# Patient Record
Sex: Male | Born: 1975
Health system: Southern US, Community
[De-identification: ages and names within clinical notes are randomized; demographics above are authoritative.]

## PROBLEM LIST (undated history)

## (undated) DIAGNOSIS — Z9081 Acquired absence of spleen: Secondary | ICD-10-CM

## (undated) DIAGNOSIS — Z9049 Acquired absence of other specified parts of digestive tract: Secondary | ICD-10-CM

## (undated) DIAGNOSIS — G35 Multiple sclerosis: Secondary | ICD-10-CM

## (undated) DIAGNOSIS — Z993 Dependence on wheelchair: Secondary | ICD-10-CM

## (undated) DIAGNOSIS — G35D Multiple sclerosis, unspecified: Secondary | ICD-10-CM

## (undated) HISTORY — DX: Dependence on wheelchair: Z99.3

## (undated) HISTORY — DX: Acquired absence of spleen: Z90.81

## (undated) HISTORY — DX: Acquired absence of other specified parts of digestive tract: Z90.49

---

## 1999-03-15 ENCOUNTER — Emergency Department (HOSPITAL_COMMUNITY): Admission: EM | Admit: 1999-03-15 | Discharge: 1999-03-15 | Payer: Self-pay | Admitting: Emergency Medicine

## 1999-07-06 ENCOUNTER — Emergency Department (HOSPITAL_COMMUNITY): Admission: EM | Admit: 1999-07-06 | Discharge: 1999-07-06 | Payer: Self-pay | Admitting: Emergency Medicine

## 2003-10-18 ENCOUNTER — Emergency Department (HOSPITAL_COMMUNITY): Admission: EM | Admit: 2003-10-18 | Discharge: 2003-10-18 | Payer: Self-pay

## 2006-11-01 ENCOUNTER — Emergency Department (HOSPITAL_COMMUNITY): Admission: EM | Admit: 2006-11-01 | Discharge: 2006-11-01 | Payer: Self-pay | Admitting: Emergency Medicine

## 2006-11-19 ENCOUNTER — Emergency Department (HOSPITAL_COMMUNITY): Admission: EM | Admit: 2006-11-19 | Discharge: 2006-11-19 | Payer: Self-pay | Admitting: Emergency Medicine

## 2006-11-25 ENCOUNTER — Ambulatory Visit (HOSPITAL_COMMUNITY): Admission: RE | Admit: 2006-11-25 | Discharge: 2006-11-25 | Payer: Self-pay | Admitting: *Deleted

## 2006-12-08 ENCOUNTER — Ambulatory Visit: Payer: Self-pay | Admitting: Family Medicine

## 2006-12-14 ENCOUNTER — Ambulatory Visit (HOSPITAL_COMMUNITY): Admission: RE | Admit: 2006-12-14 | Discharge: 2006-12-14 | Payer: Self-pay | Admitting: *Deleted

## 2007-02-02 ENCOUNTER — Ambulatory Visit: Payer: Self-pay | Admitting: Family Medicine

## 2007-06-13 ENCOUNTER — Encounter: Admission: RE | Admit: 2007-06-13 | Discharge: 2007-09-11 | Payer: Self-pay | Admitting: *Deleted

## 2007-10-26 DIAGNOSIS — G35 Multiple sclerosis: Secondary | ICD-10-CM | POA: Insufficient documentation

## 2007-10-26 DIAGNOSIS — G35D Multiple sclerosis, unspecified: Secondary | ICD-10-CM | POA: Insufficient documentation

## 2008-06-06 ENCOUNTER — Emergency Department (HOSPITAL_COMMUNITY): Admission: EM | Admit: 2008-06-06 | Discharge: 2008-06-06 | Payer: Self-pay | Admitting: Emergency Medicine

## 2008-08-24 ENCOUNTER — Emergency Department (HOSPITAL_COMMUNITY): Admission: EM | Admit: 2008-08-24 | Discharge: 2008-08-24 | Payer: Self-pay | Admitting: Emergency Medicine

## 2009-01-18 ENCOUNTER — Encounter: Admission: RE | Admit: 2009-01-18 | Discharge: 2009-04-03 | Payer: Self-pay | Admitting: *Deleted

## 2011-09-24 LAB — CBC
HCT: 44.7
Hemoglobin: 14.5
MCHC: 32.5
MCV: 71.6 — ABNORMAL LOW
Platelets: 124 — ABNORMAL LOW
RBC: 6.25 — ABNORMAL HIGH
RDW: 13.9
WBC: 4.1

## 2011-09-24 LAB — COMPREHENSIVE METABOLIC PANEL
ALT: 13
AST: 23
Albumin: 4.3
Alkaline Phosphatase: 65
BUN: 4 — ABNORMAL LOW
CO2: 29
Calcium: 9.4
Chloride: 101
Creatinine, Ser: 1.26
GFR calc Af Amer: >60
GFR calc non Af Amer: >60
Glucose, Bld: 92
Potassium: 4.3
Sodium: 139
Total Bilirubin: 0.7
Total Protein: 6.9

## 2011-09-24 LAB — DIFFERENTIAL
Basophils Absolute: 0
Basophils Relative: 0
Eosinophils Absolute: 0
Eosinophils Relative: 1
Lymphocytes Relative: 38
Lymphs Abs: 1.6
Monocytes Absolute: 0.6
Monocytes Relative: 14 — ABNORMAL HIGH
Neutro Abs: 1.9
Neutrophils Relative %: 47

## 2011-09-24 LAB — URINALYSIS, ROUTINE W REFLEX MICROSCOPIC
Bilirubin Urine: NEGATIVE
Glucose, UA: NEGATIVE
Hgb urine dipstick: NEGATIVE
Ketones, ur: NEGATIVE
Leukocytes, UA: NEGATIVE
Nitrite: NEGATIVE
Protein, ur: 100 — AB
Specific Gravity, Urine: 1.023
Urobilinogen, UA: 0.2
pH: 6.5

## 2011-09-24 LAB — URINE MICROSCOPIC-ADD ON

## 2012-01-31 ENCOUNTER — Encounter (HOSPITAL_COMMUNITY): Payer: Self-pay | Admitting: *Deleted

## 2012-01-31 ENCOUNTER — Emergency Department (HOSPITAL_COMMUNITY)
Admission: EM | Admit: 2012-01-31 | Discharge: 2012-01-31 | Disposition: A | Discharge status: Home / Self Care | Payer: Medicare Other | Attending: Emergency Medicine | Admitting: Emergency Medicine

## 2012-01-31 DIAGNOSIS — S2092XA Blister (nonthermal) of unspecified parts of thorax, initial encounter: Secondary | ICD-10-CM | POA: Insufficient documentation

## 2012-01-31 DIAGNOSIS — F17200 Nicotine dependence, unspecified, uncomplicated: Secondary | ICD-10-CM | POA: Insufficient documentation

## 2012-01-31 DIAGNOSIS — X58XXXA Exposure to other specified factors, initial encounter: Secondary | ICD-10-CM | POA: Insufficient documentation

## 2012-01-31 DIAGNOSIS — S30821A Blister (nonthermal) of abdominal wall, initial encounter: Secondary | ICD-10-CM

## 2012-01-31 HISTORY — DX: Multiple sclerosis: G35

## 2012-01-31 HISTORY — DX: Multiple sclerosis, unspecified: G35.D

## 2012-01-31 NOTE — ED Provider Notes (Signed)
History     CSN: [id number]  Arrival date & time 01/31/12  1021   First MD Initiated Contact with Patient 01/31/12 1029      Chief Complaint  Patient presents with  . Blister    left groin     The history is provided by the patient.  Pt is a 36 y/o M who presents c/o a "lesion" to his left upper thigh. He reports unprotected intercourse with a male 1 year ago and subsequent development of a "flaky" lesion to the same area after the relations. The lesion resolved and there was non recurrence until 1 week ago when he noticed a non-painful blister to the same location. He denies fever, chills, CP, abd pain, dysuria, penile pain or discharge, testicular pain or discharge. Denies rash to any other part of the body. There has been no prior treatment.  Past Medical History  Diagnosis Date  . Multiple sclerosis     History reviewed. No pertinent past surgical history.  No family history on file.  History  Substance Use Topics  . Smoking status: Current Everyday Smoker -- 0.5 packs/day    Types: Cigarettes  . Smokeless tobacco: Not on file  . Alcohol Use: No      Review of Systems 10 systems reviewed and are negative for acute change except as noted in the HPI.  Allergies  Review of patient's allergies indicates no known allergies.  Home Medications  No current outpatient prescriptions on file.  BP 132/83  Pulse 96  Temp(Src) 98.1 F (36.7 C) (Oral)  SpO2 98%  Physical Exam  Nursing note and vitals reviewed. Constitutional: He is oriented to person, place, and time. He appears well-developed and well-nourished. No distress.  HENT:  Head: Normocephalic and atraumatic.  Right Ear: External ear normal.  Left Ear: External ear normal.  Eyes: Pupils are equal, round, and reactive to light.  Neck: Neck supple.  Cardiovascular: Normal rate and regular rhythm.   Pulmonary/Chest: Effort normal. No respiratory distress.  Abdominal: Soft. He exhibits no distension. There  is no tenderness.  Genitourinary: Testes normal and penis normal.    Right testis shows no mass, no swelling and no tenderness. Left testis shows no mass, no swelling and no tenderness. Circumcised. No penile erythema or penile tenderness. No discharge found.  Musculoskeletal: He exhibits no edema and no tenderness.  Lymphadenopathy:       Right: No inguinal adenopathy present.       Left: No inguinal adenopathy present.  Neurological: He is alert and oriented to person, place, and time.  Skin: Skin is warm and dry.  Psychiatric: He has a normal mood and affect.    ED Course  Procedures (including critical care time)  Labs Reviewed - No data to display No results found.   1. Blister of groin without infection       MDM  Blister to proximal left thigh/groin. No fever. No mucosal lesions or lesions to palms or soles. No recent weight loss. Do not suspect systemic cause at this time. No penile d/c- no STD testing performed in ED. Advised f/u at STD clinic for HIV/syphillis testing.        Shaaron Adler, [place] 01/31/12 [phone]

## 2012-01-31 NOTE — ED Notes (Signed)
Patient noted  White flaky lesion to left groin approximately one year ago after "sleeping with someone". It resolved and now had blister type lesion that appeared approximately one week ago.

## 2012-02-01 NOTE — ED Provider Notes (Signed)
Medical screening examination/treatment/procedure(s) were performed by non-physician practitioner and as supervising physician I was immediately available for consultation/collaboration.  Cacey Willow, MD 02/01/12 1926 

## 2013-06-21 DIAGNOSIS — G250 Essential tremor: Secondary | ICD-10-CM | POA: Insufficient documentation

## 2013-06-21 DIAGNOSIS — G252 Other specified forms of tremor: Secondary | ICD-10-CM | POA: Insufficient documentation

## 2013-06-21 DIAGNOSIS — R270 Ataxia, unspecified: Secondary | ICD-10-CM | POA: Insufficient documentation

## 2015-10-17 ENCOUNTER — Emergency Department (HOSPITAL_COMMUNITY)
Admission: EM | Admit: 2015-10-17 | Discharge: 2015-10-17 | Disposition: A | Discharge status: Home / Self Care | Payer: Medicare Other | Attending: Emergency Medicine | Admitting: Emergency Medicine

## 2015-10-17 ENCOUNTER — Encounter (HOSPITAL_COMMUNITY): Payer: Self-pay | Admitting: Emergency Medicine

## 2015-10-17 DIAGNOSIS — Y9389 Activity, other specified: Secondary | ICD-10-CM | POA: Insufficient documentation

## 2015-10-17 DIAGNOSIS — Z79899 Other long term (current) drug therapy: Secondary | ICD-10-CM | POA: Diagnosis not present

## 2015-10-17 DIAGNOSIS — W2209XA Striking against other stationary object, initial encounter: Secondary | ICD-10-CM | POA: Insufficient documentation

## 2015-10-17 DIAGNOSIS — Y92009 Unspecified place in unspecified non-institutional (private) residence as the place of occurrence of the external cause: Secondary | ICD-10-CM | POA: Insufficient documentation

## 2015-10-17 DIAGNOSIS — Z8669 Personal history of other diseases of the nervous system and sense organs: Secondary | ICD-10-CM | POA: Diagnosis not present

## 2015-10-17 DIAGNOSIS — S99921A Unspecified injury of right foot, initial encounter: Secondary | ICD-10-CM | POA: Diagnosis present

## 2015-10-17 DIAGNOSIS — Z720 Tobacco use: Secondary | ICD-10-CM | POA: Insufficient documentation

## 2015-10-17 DIAGNOSIS — Y998 Other external cause status: Secondary | ICD-10-CM | POA: Insufficient documentation

## 2015-10-17 DIAGNOSIS — S91209A Unspecified open wound of unspecified toe(s) with damage to nail, initial encounter: Secondary | ICD-10-CM

## 2015-10-17 DIAGNOSIS — S91204A Unspecified open wound of right lesser toe(s) with damage to nail, initial encounter: Secondary | ICD-10-CM | POA: Insufficient documentation

## 2015-10-17 NOTE — ED Notes (Signed)
Bed: QM08 Expected date:  Expected time:  Means of arrival:  Comments: Ems- MS
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
## 2015-10-17 NOTE — Discharge Instructions (Signed)
You have a traumatic avulsion of your Toenail. There is a chance that he toenail may not grow back or that it may grow back deformed. The child does go back and will take quite a while to do so. In the meantime keep the area clean and dry. Use antibacterial soap and warm water at least twice a day to keep it clean. Dry with a clean towel and then treat as you would any other wound. He'll likely develop a scab over the area. Keep evaluating for any evidence of bacterial infection such as worsening redness, drainage, spreading redness from the site or swelling.

## 2015-10-17 NOTE — ED Provider Notes (Signed)
CSN: [id number]     Arrival date & time 10/17/15  1047 History   First MD Initiated Contact with Patient 10/17/15 1103     Chief Complaint  Patient presents with  . Foot Injury     (Consider location/radiation/quality/duration/timing/severity/associated sxs/prior Treatment) Patient is a 39 y.o. male presenting with foot injury.  Foot Injury Location:  Toe Time since incident:  1 day Injury: yes   Pain details:    Severity:  Mild   Onset quality:  Sudden   Duration:  1 day   Timing:  Constant Relieved by:  None tried Worsened by:  Nothing tried Ineffective treatments:  None tried Associated symptoms: no back pain, no fatigue and no fever     Past Medical History  Diagnosis Date  . Multiple sclerosis (HCC)    History reviewed. No pertinent past surgical history. History reviewed. No pertinent family history. Social History  Substance Use Topics  . Smoking status: Current Every Day Smoker -- 0.50 packs/day    Types: Cigarettes  . Smokeless tobacco: None  . Alcohol Use: No    Review of Systems  Constitutional: Negative for fever, chills and fatigue.  Eyes: Negative for photophobia and pain.  Respiratory: Negative for cough and shortness of breath.   Genitourinary: Negative for dysuria and flank pain.  Musculoskeletal: Negative for back pain.  Skin: Positive for wound.  All other systems reviewed and are negative.     Allergies  Review of patient's allergies indicates no known allergies.  Home Medications   Prior to Admission medications   Medication Sig Start Date End Date Taking? Authorizing Provider  natalizumab (TYSABRI) 300 MG/15ML injection Inject into the vein every 30 (thirty) days.   Yes Historical Provider, MD  oxybutynin (DITROPAN) 5 MG tablet Take 5 mg by mouth 2 (two) times daily.   Yes Historical Provider, MD   BP 115/88 mmHg  Pulse 63  Temp(Src) 98 F (36.7 C) (Oral)  Resp 18  SpO2 100% Physical Exam  Constitutional: He is oriented to  person, place, and time. He appears well-developed and well-nourished.  HENT:  Head: Normocephalic and atraumatic.  Eyes: Conjunctivae and EOM are normal.  Neck: Normal range of motion. Neck supple.  Cardiovascular: Normal rate and regular rhythm.   Pulmonary/Chest: Effort normal. No respiratory distress.  Abdominal: Soft. There is no tenderness.  Musculoskeletal: Normal range of motion. He exhibits no edema or tenderness.  Neurological: He is alert and oriented to person, place, and time.  Skin: Skin is warm and dry.  Avulsed right third toe nail and some skin with it. No laceration. Hemostatic. No deformity or swelling otherwise.   Nursing note and vitals reviewed.   ED Course  Procedures (including critical care time) Labs Review Labs Reviewed - No data to display  Imaging Review No results found. I have personally reviewed and evaluated these images and lab results as part of my medical decision-making.   EKG Interpretation None      MDM   Final diagnoses:  Avulsed toenail, initial encounter   Patient with avulsed toenail. Doubt underlying fracture. No nailbed injury. Hemostatic at this time. Wound cleaned, xeroform applied. Will keep clean/dry, abx ointment applied at home. .  I have personally and contemperaneously reviewed labs and imaging and used in my decision making as above.   A medical screening exam was performed and I feel the patient has had an appropriate workup for their chief complaint at this time and likelihood of emergent condition existing is low.  They have been counseled on decision, discharge, follow up and which symptoms necessitate immediate return to the emergency department. They or their family verbally stated understanding and agreement with plan and discharged in stable condition.      Marily Memos, MD 10/18/15 [phone]

## 2016-04-29 DIAGNOSIS — Z79899 Other long term (current) drug therapy: Secondary | ICD-10-CM | POA: Diagnosis not present

## 2016-04-29 DIAGNOSIS — G35 Multiple sclerosis: Secondary | ICD-10-CM | POA: Diagnosis not present

## 2016-05-13 DIAGNOSIS — G35 Multiple sclerosis: Secondary | ICD-10-CM | POA: Diagnosis not present

## 2016-05-27 DIAGNOSIS — Z79899 Other long term (current) drug therapy: Secondary | ICD-10-CM | POA: Diagnosis not present

## 2016-05-27 DIAGNOSIS — G35 Multiple sclerosis: Secondary | ICD-10-CM | POA: Diagnosis not present

## 2016-06-12 DIAGNOSIS — G35 Multiple sclerosis: Secondary | ICD-10-CM | POA: Diagnosis not present

## 2016-07-06 DIAGNOSIS — G35 Multiple sclerosis: Secondary | ICD-10-CM | POA: Diagnosis not present

## 2016-07-06 DIAGNOSIS — Z79899 Other long term (current) drug therapy: Secondary | ICD-10-CM | POA: Diagnosis not present

## 2016-08-03 DIAGNOSIS — G35 Multiple sclerosis: Secondary | ICD-10-CM | POA: Diagnosis not present

## 2016-08-03 DIAGNOSIS — Z79899 Other long term (current) drug therapy: Secondary | ICD-10-CM | POA: Diagnosis not present

## 2016-08-03 DIAGNOSIS — Z7689 Persons encountering health services in other specified circumstances: Secondary | ICD-10-CM | POA: Diagnosis not present

## 2016-08-11 DIAGNOSIS — G35 Multiple sclerosis: Secondary | ICD-10-CM | POA: Diagnosis not present

## 2016-09-01 DIAGNOSIS — Z79899 Other long term (current) drug therapy: Secondary | ICD-10-CM | POA: Diagnosis not present

## 2016-09-01 DIAGNOSIS — G35 Multiple sclerosis: Secondary | ICD-10-CM | POA: Diagnosis not present

## 2016-09-25 DIAGNOSIS — G35 Multiple sclerosis: Secondary | ICD-10-CM | POA: Diagnosis not present

## 2016-10-02 DIAGNOSIS — G35 Multiple sclerosis: Secondary | ICD-10-CM | POA: Diagnosis not present

## 2016-10-02 DIAGNOSIS — Z79899 Other long term (current) drug therapy: Secondary | ICD-10-CM | POA: Diagnosis not present

## 2016-10-07 DIAGNOSIS — G35 Multiple sclerosis: Secondary | ICD-10-CM | POA: Diagnosis not present

## 2016-10-28 DIAGNOSIS — G35 Multiple sclerosis: Secondary | ICD-10-CM | POA: Diagnosis not present

## 2016-11-02 DIAGNOSIS — Z79899 Other long term (current) drug therapy: Secondary | ICD-10-CM | POA: Diagnosis not present

## 2016-11-02 DIAGNOSIS — G35 Multiple sclerosis: Secondary | ICD-10-CM | POA: Diagnosis not present

## 2016-12-07 DIAGNOSIS — G35 Multiple sclerosis: Secondary | ICD-10-CM | POA: Diagnosis not present

## 2016-12-08 DIAGNOSIS — G35 Multiple sclerosis: Secondary | ICD-10-CM | POA: Diagnosis not present

## 2016-12-08 DIAGNOSIS — Z79899 Other long term (current) drug therapy: Secondary | ICD-10-CM | POA: Diagnosis not present

## 2016-12-09 DIAGNOSIS — G35 Multiple sclerosis: Secondary | ICD-10-CM | POA: Diagnosis not present

## 2017-01-12 DIAGNOSIS — Z79899 Other long term (current) drug therapy: Secondary | ICD-10-CM | POA: Diagnosis not present

## 2017-01-12 DIAGNOSIS — G35 Multiple sclerosis: Secondary | ICD-10-CM | POA: Diagnosis not present

## 2017-01-15 DIAGNOSIS — G35 Multiple sclerosis: Secondary | ICD-10-CM | POA: Diagnosis not present

## 2017-01-21 DIAGNOSIS — G35 Multiple sclerosis: Secondary | ICD-10-CM | POA: Diagnosis not present

## 2017-02-17 DIAGNOSIS — R270 Ataxia, unspecified: Secondary | ICD-10-CM | POA: Diagnosis not present

## 2017-02-17 DIAGNOSIS — Z79899 Other long term (current) drug therapy: Secondary | ICD-10-CM | POA: Diagnosis not present

## 2017-02-17 DIAGNOSIS — R251 Tremor, unspecified: Secondary | ICD-10-CM | POA: Diagnosis not present

## 2017-02-17 DIAGNOSIS — G35 Multiple sclerosis: Secondary | ICD-10-CM | POA: Diagnosis not present

## 2017-03-11 DIAGNOSIS — R29898 Other symptoms and signs involving the musculoskeletal system: Secondary | ICD-10-CM | POA: Diagnosis not present

## 2017-03-11 DIAGNOSIS — Z7409 Other reduced mobility: Secondary | ICD-10-CM | POA: Diagnosis not present

## 2017-03-11 DIAGNOSIS — G35 Multiple sclerosis: Secondary | ICD-10-CM | POA: Diagnosis not present

## 2017-03-15 DIAGNOSIS — G35 Multiple sclerosis: Secondary | ICD-10-CM | POA: Diagnosis not present

## 2017-03-17 DIAGNOSIS — G35 Multiple sclerosis: Secondary | ICD-10-CM | POA: Diagnosis not present

## 2017-04-14 DIAGNOSIS — Z79899 Other long term (current) drug therapy: Secondary | ICD-10-CM | POA: Diagnosis not present

## 2017-04-14 DIAGNOSIS — G35 Multiple sclerosis: Secondary | ICD-10-CM | POA: Diagnosis not present

## 2017-04-26 DIAGNOSIS — G35 Multiple sclerosis: Secondary | ICD-10-CM | POA: Diagnosis not present

## 2017-04-27 ENCOUNTER — Observation Stay (HOSPITAL_COMMUNITY)
Admission: EM | Admit: 2017-04-27 | Discharge: 2017-04-28 | Disposition: A | Discharge status: Home / Self Care | Payer: Medicare Other | Attending: General Surgery | Admitting: General Surgery

## 2017-04-27 ENCOUNTER — Emergency Department (HOSPITAL_COMMUNITY): Payer: Medicare Other

## 2017-04-27 ENCOUNTER — Emergency Department (HOSPITAL_COMMUNITY): Payer: Medicare Other | Admitting: Certified Registered"

## 2017-04-27 ENCOUNTER — Encounter (HOSPITAL_COMMUNITY): Payer: Self-pay

## 2017-04-27 ENCOUNTER — Encounter (HOSPITAL_COMMUNITY)
Admission: EM | Disposition: A | Discharge status: Home / Self Care | Payer: Self-pay | Source: Home / Self Care | Attending: Physician Assistant

## 2017-04-27 DIAGNOSIS — S6992XA Unspecified injury of left wrist, hand and finger(s), initial encounter: Secondary | ICD-10-CM | POA: Diagnosis not present

## 2017-04-27 DIAGNOSIS — F17210 Nicotine dependence, cigarettes, uncomplicated: Secondary | ICD-10-CM | POA: Insufficient documentation

## 2017-04-27 DIAGNOSIS — S68123A Partial traumatic metacarpophalangeal amputation of left middle finger, initial encounter: Principal | ICD-10-CM | POA: Insufficient documentation

## 2017-04-27 DIAGNOSIS — S6990XA Unspecified injury of unspecified wrist, hand and finger(s), initial encounter: Secondary | ICD-10-CM | POA: Diagnosis not present

## 2017-04-27 DIAGNOSIS — Z23 Encounter for immunization: Secondary | ICD-10-CM | POA: Insufficient documentation

## 2017-04-27 DIAGNOSIS — G35 Multiple sclerosis: Secondary | ICD-10-CM | POA: Insufficient documentation

## 2017-04-27 DIAGNOSIS — S68113A Complete traumatic metacarpophalangeal amputation of left middle finger, initial encounter: Secondary | ICD-10-CM | POA: Diagnosis not present

## 2017-04-27 DIAGNOSIS — W208XXA Other cause of strike by thrown, projected or falling object, initial encounter: Secondary | ICD-10-CM | POA: Insufficient documentation

## 2017-04-27 DIAGNOSIS — S61311A Laceration without foreign body of left index finger with damage to nail, initial encounter: Secondary | ICD-10-CM

## 2017-04-27 DIAGNOSIS — S68613A Complete traumatic transphalangeal amputation of left middle finger, initial encounter: Secondary | ICD-10-CM | POA: Diagnosis not present

## 2017-04-27 DIAGNOSIS — T148XXA Other injury of unspecified body region, initial encounter: Secondary | ICD-10-CM | POA: Diagnosis not present

## 2017-04-27 DIAGNOSIS — Z79899 Other long term (current) drug therapy: Secondary | ICD-10-CM | POA: Insufficient documentation

## 2017-04-27 HISTORY — PX: AMPUTATION: SHX166

## 2017-04-27 SURGERY — AMPUTATION DIGIT
Anesthesia: General | Site: Finger | Laterality: Left

## 2017-04-27 MED ORDER — ONDANSETRON HCL 4 MG/2ML IJ SOLN
INTRAMUSCULAR | Status: AC
  Filled 2017-04-27: qty 2

## 2017-04-27 MED ORDER — ONDANSETRON HCL 4 MG/2ML IJ SOLN
INTRAMUSCULAR | Status: DC | PRN
  Administered 2017-04-27: 4 mg via INTRAVENOUS

## 2017-04-27 MED ORDER — LACTATED RINGERS IV SOLN
INTRAVENOUS | Status: DC | PRN
  Administered 2017-04-27 (×2): via INTRAVENOUS

## 2017-04-27 MED ORDER — 0.9 % SODIUM CHLORIDE (POUR BTL) OPTIME
TOPICAL | Status: DC | PRN
  Administered 2017-04-27: 1000 mL

## 2017-04-27 MED ORDER — PROPOFOL 10 MG/ML IV BOLUS
INTRAVENOUS | Status: DC | PRN
Start: 2017-04-27 — End: 2017-04-27
  Administered 2017-04-27: 200 mg via INTRAVENOUS

## 2017-04-27 MED ORDER — FENTANYL CITRATE (PF) 100 MCG/2ML IJ SOLN: 25.0000 ug | INTRAMUSCULAR | Status: DC | PRN

## 2017-04-27 MED ORDER — SUFENTANIL CITRATE 50 MCG/ML IV SOLN
INTRAVENOUS | Status: DC | PRN
  Administered 2017-04-27: 10 ug via INTRAVENOUS

## 2017-04-27 MED ORDER — HYDROCODONE-ACETAMINOPHEN 5-325 MG PO TABS
1.0000 | ORAL_TABLET | Freq: Four times a day (QID) | ORAL | Status: DC | PRN
  Administered 2017-04-28: 2 via ORAL
  Filled 2017-04-27 (×2): qty 2

## 2017-04-27 MED ORDER — CEPHALEXIN 500 MG PO CAPS: 500.0000 mg | ORAL_CAPSULE | Freq: Four times a day (QID) | ORAL | 0 refills | Status: DC

## 2017-04-27 MED ORDER — CEPHALEXIN 500 MG PO CAPS
500.0000 mg | ORAL_CAPSULE | Freq: Four times a day (QID) | ORAL | Status: DC
  Administered 2017-04-28 (×3): 500 mg via ORAL
  Filled 2017-04-27 (×3): qty 1

## 2017-04-27 MED ORDER — LIDOCAINE HCL (CARDIAC) 20 MG/ML IV SOLN
INTRAVENOUS | Status: DC | PRN
  Administered 2017-04-27: 100 mg via INTRAVENOUS

## 2017-04-27 MED ORDER — OXYCODONE HCL 5 MG/5ML PO SOLN: 5.0000 mg | Freq: Once | ORAL | Status: DC | PRN

## 2017-04-27 MED ORDER — CEFAZOLIN SODIUM 1 G IJ SOLR
INTRAMUSCULAR | Status: DC | PRN
  Administered 2017-04-27: 2 g via INTRAMUSCULAR

## 2017-04-27 MED ORDER — CEFAZOLIN SODIUM 1 G IJ SOLR
INTRAMUSCULAR | Status: AC
  Filled 2017-04-27: qty 20

## 2017-04-27 MED ORDER — MIDAZOLAM HCL 2 MG/2ML IJ SOLN
INTRAMUSCULAR | Status: AC
  Filled 2017-04-27: qty 2

## 2017-04-27 MED ORDER — SODIUM CHLORIDE 0.9 % IJ SOLN
INTRAMUSCULAR | Status: AC
  Filled 2017-04-27: qty 10

## 2017-04-27 MED ORDER — BUPIVACAINE HCL (PF) 0.25 % IJ SOLN
INTRAMUSCULAR | Status: DC | PRN
  Administered 2017-04-27: 10 mL

## 2017-04-27 MED ORDER — TETANUS-DIPHTH-ACELL PERTUSSIS 5-2.5-18.5 LF-MCG/0.5 IM SUSP
0.5000 mL | Freq: Once | INTRAMUSCULAR | Status: AC
  Administered 2017-04-27: 0.5 mL via INTRAMUSCULAR
  Filled 2017-04-27: qty 0.5

## 2017-04-27 MED ORDER — ONDANSETRON HCL 4 MG/2ML IJ SOLN: 4.0000 mg | Freq: Once | INTRAMUSCULAR | Status: DC | PRN

## 2017-04-27 MED ORDER — DEXAMETHASONE SODIUM PHOSPHATE 10 MG/ML IJ SOLN
INTRAMUSCULAR | Status: DC | PRN
  Administered 2017-04-27: 10 mg via INTRAVENOUS

## 2017-04-27 MED ORDER — BUPIVACAINE HCL (PF) 0.25 % IJ SOLN
INTRAMUSCULAR | Status: AC
  Filled 2017-04-27: qty 30

## 2017-04-27 MED ORDER — DEXAMETHASONE SODIUM PHOSPHATE 10 MG/ML IJ SOLN
INTRAMUSCULAR | Status: AC
  Filled 2017-04-27: qty 1

## 2017-04-27 MED ORDER — SUFENTANIL CITRATE 50 MCG/ML IV SOLN
INTRAVENOUS | Status: AC
  Filled 2017-04-27: qty 1

## 2017-04-27 MED ORDER — GLYCOPYRROLATE 0.2 MG/ML IJ SOLN
INTRAMUSCULAR | Status: DC | PRN
  Administered 2017-04-27: 0.2 mg via INTRAVENOUS

## 2017-04-27 MED ORDER — MIDAZOLAM HCL 5 MG/5ML IJ SOLN
INTRAMUSCULAR | Status: DC | PRN
  Administered 2017-04-27: 2 mg via INTRAVENOUS

## 2017-04-27 MED ORDER — BACITRACIN ZINC 500 UNIT/GM EX OINT
TOPICAL_OINTMENT | CUTANEOUS | Status: AC
  Filled 2017-04-27: qty 28.35

## 2017-04-27 MED ORDER — HYDROCODONE-ACETAMINOPHEN 7.5-325 MG PO TABS: 1.0000 | ORAL_TABLET | Freq: Four times a day (QID) | ORAL | 0 refills | Status: DC | PRN

## 2017-04-27 MED ORDER — OXYCODONE HCL 5 MG PO TABS: 5.0000 mg | ORAL_TABLET | Freq: Once | ORAL | Status: DC | PRN

## 2017-04-27 MED ORDER — LIDOCAINE 2% (20 MG/ML) 5 ML SYRINGE
INTRAMUSCULAR | Status: AC
  Filled 2017-04-27: qty 5

## 2017-04-27 MED ORDER — PROPOFOL 10 MG/ML IV BOLUS
INTRAVENOUS | Status: AC
  Filled 2017-04-27: qty 20

## 2017-04-27 SURGICAL SUPPLY — 41 items
BANDAGE ACE 4X5 VEL STRL LF (GAUZE/BANDAGES/DRESSINGS) ×1 IMPLANT
BNDG CMPR 9X4 STRL LF SNTH (GAUZE/BANDAGES/DRESSINGS) ×1
BNDG COHESIVE 1X5 TAN STRL LF (GAUZE/BANDAGES/DRESSINGS) ×1 IMPLANT
BNDG CONFORM 2 STRL LF (GAUZE/BANDAGES/DRESSINGS) ×1 IMPLANT
BNDG ESMARK 4X9 LF (GAUZE/BANDAGES/DRESSINGS) ×2 IMPLANT
BNDG GAUZE ELAST 4 BULKY (GAUZE/BANDAGES/DRESSINGS) ×1 IMPLANT
CHLORAPREP W/TINT 26ML (MISCELLANEOUS) ×1 IMPLANT
CORDS BIPOLAR (ELECTRODE) ×2 IMPLANT
DRSG ADAPTIC 3X8 NADH LF (GAUZE/BANDAGES/DRESSINGS) ×1 IMPLANT
ELECT REM PT RETURN 9FT ADLT (ELECTROSURGICAL) ×2
ELECTRODE REM PT RTRN 9FT ADLT (ELECTROSURGICAL) IMPLANT
GAUZE SPONGE 4X4 12PLY STRL (GAUZE/BANDAGES/DRESSINGS) ×2 IMPLANT
GAUZE XEROFORM 1X8 LF (GAUZE/BANDAGES/DRESSINGS) ×2 IMPLANT
GLOVE BIOGEL M 8.0 STRL (GLOVE) ×2 IMPLANT
GOWN STRL REUS W/ TWL LRG LVL3 (GOWN DISPOSABLE) ×1 IMPLANT
GOWN STRL REUS W/ TWL XL LVL3 (GOWN DISPOSABLE) ×1 IMPLANT
GOWN STRL REUS W/TWL LRG LVL3 (GOWN DISPOSABLE) ×2
GOWN STRL REUS W/TWL XL LVL3 (GOWN DISPOSABLE) ×2
KIT BASIN OR (CUSTOM PROCEDURE TRAY) ×2 IMPLANT
KIT ROOM TURNOVER OR (KITS) ×2 IMPLANT
NDL HYPO 25GX1X1/2 BEV (NEEDLE) IMPLANT
NEEDLE HYPO 25GX1X1/2 BEV (NEEDLE) ×2 IMPLANT
NS IRRIG 1000ML POUR BTL (IV SOLUTION) ×2 IMPLANT
PACK ORTHO EXTREMITY (CUSTOM PROCEDURE TRAY) ×2 IMPLANT
PAD ARMBOARD 7.5X6 YLW CONV (MISCELLANEOUS) ×4 IMPLANT
PAD CAST 4YDX4 CTTN HI CHSV (CAST SUPPLIES) ×1 IMPLANT
PADDING CAST COTTON 4X4 STRL (CAST SUPPLIES)
PADDING UNDERCAST 2 STRL (CAST SUPPLIES) ×1
PADDING UNDERCAST 2X4 STRL (CAST SUPPLIES) IMPLANT
SCRUB POVIDONE IODINE 4 OZ (MISCELLANEOUS) ×1 IMPLANT
SOAP 2 % CHG 4 OZ (WOUND CARE) ×1 IMPLANT
SOL PREP POV-IOD 4OZ 10% (MISCELLANEOUS) ×1 IMPLANT
SPONGE LAP 18X18 X RAY DECT (DISPOSABLE) ×1 IMPLANT
SPONGE LAP 4X18 X RAY DECT (DISPOSABLE) ×2 IMPLANT
SUT ETHILON 5 0 PS 2 18 (SUTURE) ×1 IMPLANT
SYR 10ML LL (SYRINGE) ×1 IMPLANT
TOWEL OR 17X24 6PK STRL BLUE (TOWEL DISPOSABLE) ×2 IMPLANT
TOWEL OR 17X26 10 PK STRL BLUE (TOWEL DISPOSABLE) ×2 IMPLANT
TUBE CONNECTING 12X1/4 (SUCTIONS) ×1 IMPLANT
WATER STERILE IRR 1000ML POUR (IV SOLUTION) ×1 IMPLANT
YANKAUER SUCT BULB TIP NO VENT (SUCTIONS) ×1 IMPLANT

## 2017-04-27 NOTE — ED Provider Notes (Addendum)
[facility]-EMERGENCY DEPT Provider Note   CSN: [id number] Arrival date & time: 04/27/17  1828  By signing my name below, I, Nathan Marsh, attest that this documentation has been prepared under the direction and in the presence of Salvator Seppala Randall An, MD.  Electronically Signed: Phillips Marsh, Scribe. 04/27/2017. 6:44 PM.  History   Chief Complaint Chief Complaint  Patient presents with  . Finger Injury   Nathan Marsh. is a 41 y.o. male with a PMHx of MS who presents to the Emergency Department under the influence of marijuana, with complaints of left hand 3rd digit fingertip amputation. Pt was in his wheelchair at the kitchen table, when he began to fall. In attempt to break this fall, the table flipped over and pinned his left 3rd digit. The fingertip completely came off, and is in a bag on ice in pt's room.   Pt denies experiencing any other acute sx, including finger pain, nausea, vomiting or abdominal pain.  The history is provided by the patient and the EMS personnel. No language interpreter was used.    Past Medical History:  Diagnosis Date  . Multiple sclerosis (HCC)     There are no active problems to display for this patient.   History reviewed. No pertinent surgical history.     Home Medications    Prior to Admission medications   Not on File    Family History History reviewed. No pertinent family history.  Social History Social History  Substance Use Topics  . Smoking status: Current Every Day Smoker    Packs/day: 0.50    Types: Cigarettes  . Smokeless tobacco: Never Used  . Alcohol use Yes     Allergies   Patient has no allergy information on record.   Review of Systems Review of Systems  Constitutional: Negative for fever.  Gastrointestinal: Negative for abdominal pain.  Musculoskeletal: Negative for joint swelling and myalgias.       Left fingertip amputation   Physical Exam Updated Vital Signs BP 126/80 (BP Location: Right Arm)    Pulse 89   Temp 98.5 F (36.9 C) (Oral)   Resp 16   Ht 5\' 6"  (1.676 m)   Wt 145 lb (65.8 kg)   SpO2 97% Comment: Simultaneous filing. User may not have seen previous data.  BMI 23.40 kg/m   Physical Exam  Constitutional: He is oriented to person, place, and time. He appears well-nourished.  HENT:  Head: Normocephalic.  Eyes: Conjunctivae are normal.  Cardiovascular: Normal rate.   Pulmonary/Chest: Effort normal.  Musculoskeletal:  Left third finger distal amp.   Neurological: He is oriented to person, place, and time.  Skin: Skin is warm and dry. He is not diaphoretic.  Psychiatric: He has a normal mood and affect. His behavior is normal.         ED Treatments / Results  DIAGNOSTIC STUDIES: Oxygen Saturation is 97% on RA, nl by my interpretation.    COORDINATION OF CARE: 6:44 PM Discussed treatment plan with pt at bedside and pt agreed to plan.  Labs (all labs ordered are listed, but only abnormal results are displayed) Labs Reviewed - No data to display  EKG  EKG Interpretation None       Radiology No results found.  Procedures Procedures (including critical care time)  Medications Ordered in ED Medications  Tdap (BOOSTRIX) injection 0.5 mL (not administered)     Initial Impression / Assessment and Plan / ED Course  I have reviewed the triage  vital signs and the nursing notes.  Pertinent labs & imaging results that were available during my care of the patient were reviewed by me and considered in my medical decision making (see chart for details).    I personally performed the services described in this documentation, which was scribed in my presence. The recorded information has been reviewed and is accurate.    Patient's very friendly 41 year old male presenting with distal tip amp of his left middle finger. Patient is high on marijuana and giggly, no pain. Last ate this morning.   7:51 PM Discussed with Dr. Kristine Linea. He will arrive to do  operation. Patient tetanus up-to-date.  Final Clinical Impressions(s) / ED Diagnoses   Final diagnoses:  None    New Prescriptions New Prescriptions   No medications on file     Demarko Zeimet Randall An, MD 04/28/17 1919    Abelino Derrick, MD 05/02/17 [phone]

## 2017-04-27 NOTE — H&P (Signed)
Reason for Consult:finger injury Referring Physician: ER  CC:I cut my finger  HPI:  Roc Teem. is an 41 y.o. right handed male who presents with  Partial amputation of Left Long finger.  Details patient finds hard to remember, but states that a table fell on it?      .   Pain is rated at    8/10 and is described as sharp.  Pain is constant.  Pain is made better by rest/immobilization, worse with motion.   Associated signs/symptoms: bleeding, tissue loss Previous treatment:  n/a  Past Medical History:  Diagnosis Date  . Multiple sclerosis (HCC)     History reviewed. No pertinent surgical history.  History reviewed. No pertinent family history.  Social History:  reports that he has been smoking Cigarettes.  He has been smoking about 0.50 packs per day. He has never used smokeless tobacco. He reports that he drinks alcohol. He reports that he uses drugs, including Marijuana.  Allergies: Not on File  Medications: I have reviewed the patient's current medications.  No results found for this or any previous visit (from the past 48 hour(s)).  Dg Hand Complete Left  Result Date: 04/27/2017 CLINICAL DATA:  Finger amputation EXAM: LEFT HAND - COMPLETE 3+ VIEW COMPARISON:  None. FINDINGS: No subluxation or radiopaque foreign body. Soft tissue and bony amputation of the distal portion of the third digit with amputation of the tuft of the third distal phalanx. IMPRESSION: Soft tissue and bony amputation involving the tuft of the third distal phalanx. Electronically Signed   By: Jasmine Pang M.D.   On: 04/27/2017 18:56    Pertinent items are noted in HPI. Temp:  [98.5 F (36.9 C)] 98.5 F (36.9 C) (05/01 1830) Pulse Rate:  [89] 89 (05/01 1830) Resp:  [16] 16 (05/01 1830) BP: (126)/(80) 126/80 (05/01 1830) SpO2:  [97 %] 97 % (05/01 1830) Weight:  [65.8 kg (145 lb)] 65.8 kg (145 lb) (05/01 1832) General appearance: alert and cooperative Resp: clear to auscultation  bilaterally Cardio: regular rate and rhythm GI: soft, non-tender; bowel sounds normal; no masses,  no organomegaly Extremities: extremities normal, atraumatic, no cyanosis or edema  Except for left long finger with a radially based distal amputation thru nail plate, with bone exposed   Assessment: Partial amputation of left long finger Plan: Discussed flap vs revision amputation with patient, patient would like to do revision amputation, realizes that finger will be shorter and without nail. I have discussed this treatment plan in detail with patient , including the risks of the recommended treatment or surgery, the benefits and the alternatives.  The patient understands that additional treatment may be necessary.  Keylan Costabile C Isael Stille 04/27/2017, 9:41 PM

## 2017-04-27 NOTE — ED Triage Notes (Addendum)
Pt was brought in by [facility]. Pt was at kitchen table in wheel chair, pt states that he almost fell out of wheelchair and tried to hold on to table and noticed blood on his finger. Left hand third digit tip of finger fully came off. Pt states he is currently high on marijuana and denies any alcohol use at this time. Tip of finger bagged and placed on ice at bedside. Pt in NAD. Hx of MS.

## 2017-04-27 NOTE — Discharge Instructions (Signed)
Discharge Instructions:  Keep your dressing clean, dry and in place until instructed to remove by Dr. Clarice Bonaventure.  If the dressing becomes dirty or wet call the office for instructions during business hours. Elevate the extremity to help with swelling, this will also help with any discomfort. Take your medication as prescribed. No lifting with the injured  extremity. If you feel that the dressing is too tight, you may loosen it, but keep it on; finger tips should be pink; if there is a concern, call the office. [phone] Ice may be used if the injury is a fracture, do not apply ice directly to the skin. Please call the office on the next business day after discharge to arrange a follow up appointment.  Call [phone] between the hours of 9am - 5pm M-Th or 9am - 1pm on Fri. For most hand injuries and/or conditions, you may return to work using the uninjured hand (one handed duty) within 24-72 hours.  A detailed note will be provided to you at your follow up appointment or may contact the office prior to your follow up.    

## 2017-04-27 NOTE — ED Notes (Signed)
Patient transported to X-ray 

## 2017-04-27 NOTE — Anesthesia Procedure Notes (Signed)
Procedure Name: LMA Insertion Date/Time: 04/27/2017 9:52 PM Performed by: Melina Schools Pre-anesthesia Checklist: Patient identified, Emergency Drugs available, Suction available, Patient being monitored and Timeout performed Patient Re-evaluated:Patient Re-evaluated prior to inductionOxygen Delivery Method: Circle system utilized Preoxygenation: Pre-oxygenation with 100% oxygen Intubation Type: IV induction Ventilation: Mask ventilation without difficulty LMA: LMA inserted LMA Size: 4.0 Number of attempts: 1 Placement Confirmation: positive ETCO2 and breath sounds checked- equal and bilateral Tube secured with: Tape Dental Injury: Teeth and Oropharynx as per pre-operative assessment

## 2017-04-27 NOTE — Transfer of Care (Signed)
Immediate Anesthesia Transfer of Care Note  Patient: Kadence Akiyama.  Procedure(s) Performed: Procedure(s): AMPUTATION DIGIT REVISION LEFT MIDDLE FINGER (Left)  Patient Location: PACU  Anesthesia Type:General  Level of Consciousness: sedated, unresponsive, patient cooperative and responds to stimulation  Airway & Oxygen Therapy: Patient Spontanous Breathing and Patient connected to nasal cannula oxygen  Post-op Assessment: Report given to RN and Post -op Vital signs reviewed and stable  Post vital signs: Reviewed and stable  Last Vitals:  Vitals:   04/27/17 1830 04/27/17 2227  BP: 126/80 104/78  Pulse: 89 90  Resp: 16 10  Temp: 36.9 C 36.6 C    Last Pain:  Vitals:   04/27/17 2227  TempSrc:   PainSc: Asleep         Complications: No apparent anesthesia complications

## 2017-04-27 NOTE — Progress Notes (Signed)
Received patient from PACU.  AOx4 with slight delay in speech due MS.  VS stable, pain at 2/10 at left 3rd finger due to recent partial amputation and O2Sat at 98% on RA.  Gave graham crackers and peanut butter and ice cream to eat.  CNA oriented patient to room, bed controls and call light. Will monitor.

## 2017-04-27 NOTE — ED Notes (Signed)
Pt transported to short stay Rm 36.

## 2017-04-28 ENCOUNTER — Encounter (HOSPITAL_COMMUNITY): Payer: Self-pay | Admitting: General Surgery

## 2017-04-28 DIAGNOSIS — S6990XA Unspecified injury of unspecified wrist, hand and finger(s), initial encounter: Secondary | ICD-10-CM | POA: Diagnosis not present

## 2017-04-28 DIAGNOSIS — S68123A Partial traumatic metacarpophalangeal amputation of left middle finger, initial encounter: Secondary | ICD-10-CM | POA: Diagnosis not present

## 2017-04-28 DIAGNOSIS — S68119A Complete traumatic metacarpophalangeal amputation of unspecified finger, initial encounter: Secondary | ICD-10-CM | POA: Diagnosis not present

## 2017-04-28 MED ORDER — CEPHALEXIN 500 MG PO CAPS
500.0000 mg | ORAL_CAPSULE | Freq: Four times a day (QID) | ORAL | Status: DC
  Administered 2017-04-28: 500 mg via ORAL
  Filled 2017-04-28: qty 1

## 2017-04-28 NOTE — Progress Notes (Signed)
Patient claimed that he has some belongings when he got admittted, that he was wearing a denim pants that and has his cellphone and ATM card on it. I checked belongings at the ED, security, shot stay and PACU but no belongings was checked in as claimed by pt.

## 2017-04-28 NOTE — Care Management Obs Status (Signed)
MEDICARE OBSERVATION STATUS NOTIFICATION   Patient Details  Name: Nathan Marsh. MRN: [id number] Date of Birth: 1976/04/04   Medicare Observation Status Notification Given:  Yes    Kingsley Plan, RN 04/28/2017, 10:03 AM

## 2017-04-28 NOTE — Anesthesia Preprocedure Evaluation (Addendum)
Anesthesia Evaluation  Patient identified by MRN, date of birth, ID band Patient awake    Reviewed: Allergy & Precautions, NPO status , Patient's Chart, lab work & pertinent test results  Airway Mallampati: II  TM Distance: >3 FB Neck ROM: Full    Dental  (+) Teeth Intact, Dental Advisory Given   Pulmonary Current Smoker,    breath sounds clear to auscultation       Cardiovascular  Rhythm:Regular Rate:Normal     Neuro/Psych    GI/Hepatic   Endo/Other    Renal/GU      Musculoskeletal   Abdominal   Peds  Hematology   Anesthesia Other Findings   Reproductive/Obstetrics                            Anesthesia Physical Anesthesia Plan  ASA: III  Anesthesia Plan: General   Post-op Pain Management:    Induction: Intravenous  Airway Management Planned: LMA  Additional Equipment:   Intra-op Plan:   Post-operative Plan:   Informed Consent:   Dental advisory given  Plan Discussed with: CRNA and Anesthesiologist  Anesthesia Plan Comments:         Anesthesia Quick Evaluation

## 2017-04-28 NOTE — Progress Notes (Signed)
Received a call from patient's mother Cordale Filsinger wanting to know the discharge status of patient. Patient claimed earlier that he has no family at all.

## 2017-04-28 NOTE — Clinical Social Work Note (Signed)
CSW consulted for assistance with transportation and no insurance. Pt discharging home. Pt's insurance is [employer]. RNCM made aware and is assisting with D/C needs. CSW signing off as no further SW needs identified. Please reconsult if new SW needs arise.   Corlis Hove, LCSWA, LCASA  Clinical Social Work  [phone]

## 2017-04-28 NOTE — Care Management Note (Signed)
Case Management Note  Patient Details  Name: Nathan Marsh. MRN: [id number] Date of Birth: 22-May-1976  Subjective/Objective:                    Action/Plan:  Patient with history of MS. Had [facility] arranged to pick him today at home at 1130. Patient asked NCM to call and cancel 1130 pick up with [facility] [phone] same done.   Patient requesting ambulance transportation home at discharge. Patient has his house keys and he states someone will be home all day. Confirmed face sheet information. Will arrange ambulance transport at discharge. Expected Discharge Date:  04/27/17               Expected Discharge Plan:  Home/Self Care  In-House Referral:     Discharge planning Services  CM Consult  Post Acute Care Choice:    Choice offered to:  Patient  DME Arranged:    DME Agency:     HH Arranged:    HH Agency:     Status of Service:  In process, will continue to follow  If discussed at Long Length of Stay Meetings, dates discussed:    Additional Comments:  Kingsley Plan, RN 04/28/2017, 10:04 AM

## 2017-04-28 NOTE — Progress Notes (Signed)
I was notified by the short stay nurse that he was brought to OR  to them on his boxers shorts.

## 2017-04-28 NOTE — Progress Notes (Signed)
Patient discharged to home via [facility].  Called patient father Mr. Nathan Marsh at [phone] to ensure that someone will be at patient's home when [facility] staff arrive to ensure patient's safety.  One of [facility] staff was able to talk to patient's father.

## 2017-04-28 NOTE — Progress Notes (Signed)
Patient for discharge but was unable to print out discharge instruction, notified Dr. Izora Ribas and said ok to  discharge patient without instructions, leadership made aware, asked pharmacy's help to be able to print the instruction as I was not comfortable sending this patient without instructions.

## 2017-04-28 NOTE — Anesthesia Postprocedure Evaluation (Addendum)
Anesthesia Post Note  Patient: Nathan Marsh.  Procedure(s) Performed: Procedure(s) (LRB): AMPUTATION DIGIT REVISION LEFT MIDDLE FINGER (Left)  Patient location during evaluation: PACU Anesthesia Type: General Level of consciousness: awake, awake and alert and oriented Pain management: pain level controlled Vital Signs Assessment: post-procedure vital signs reviewed and stable Respiratory status: spontaneous breathing, nonlabored ventilation and respiratory function stable Cardiovascular status: blood pressure returned to baseline Anesthetic complications: no       Last Vitals:  Vitals:   04/27/17 2315 04/27/17 2346  BP: 133/90 134/87  Pulse: 85 72  Resp: 16 16  Temp: 36.4 C 36.3 C    Last Pain:  Vitals:   04/28/17 0151  TempSrc:   PainSc: Asleep                 [name]

## 2017-04-29 DIAGNOSIS — R270 Ataxia, unspecified: Secondary | ICD-10-CM | POA: Diagnosis not present

## 2017-04-29 DIAGNOSIS — Z7409 Other reduced mobility: Secondary | ICD-10-CM | POA: Diagnosis not present

## 2017-04-29 DIAGNOSIS — G35 Multiple sclerosis: Secondary | ICD-10-CM | POA: Diagnosis not present

## 2017-05-02 ENCOUNTER — Emergency Department (HOSPITAL_COMMUNITY)
Admission: EM | Admit: 2017-05-02 | Discharge: 2017-05-02 | Disposition: A | Discharge status: Home / Self Care | Payer: Medicare Other | Attending: Emergency Medicine | Admitting: Emergency Medicine

## 2017-05-02 ENCOUNTER — Encounter (HOSPITAL_COMMUNITY): Payer: Self-pay

## 2017-05-02 DIAGNOSIS — Z4801 Encounter for change or removal of surgical wound dressing: Secondary | ICD-10-CM | POA: Diagnosis not present

## 2017-05-02 DIAGNOSIS — Z5189 Encounter for other specified aftercare: Secondary | ICD-10-CM

## 2017-05-02 DIAGNOSIS — Z09 Encounter for follow-up examination after completed treatment for conditions other than malignant neoplasm: Secondary | ICD-10-CM | POA: Diagnosis not present

## 2017-05-02 DIAGNOSIS — F17210 Nicotine dependence, cigarettes, uncomplicated: Secondary | ICD-10-CM | POA: Insufficient documentation

## 2017-05-02 MED ORDER — CEPHALEXIN 500 MG PO CAPS: 500.0000 mg | ORAL_CAPSULE | Freq: Four times a day (QID) | ORAL | 0 refills | Status: AC

## 2017-05-02 NOTE — ED Provider Notes (Signed)
  Face-to-face evaluation   History: He is here for evaluation of a wound which he is concerned is infected.  He is unsure what his discharge instructions were following his recent revision.  He is not currently taking any medications, for the wound.  Physical exam: Alert, cooperative.  Left middle finger were wound assessed, wound edges fairly well approximated, sutures intact, no bleeding, drainage, fluctuance or tenderness to palpation.  Medical screening examination/treatment/procedure(s) were conducted as a shared visit with non-physician practitioner(s) and myself.  I personally evaluated the patient during the encounter     Mancel Bale, MD 05/05/17 [phone]

## 2017-05-02 NOTE — ED Provider Notes (Signed)
[facility] Provider Note   CSN: [id number] Arrival date & time: 05/02/17  1415     History   Chief Complaint Chief Complaint  Patient presents with  . Wound Check    HPI Nathan Marsh. is a 41 y.o. male.  HPI He presents for wound check.  He had a traumatic amputation of the left middle finger on 04/27/17 with surgical revision.  He reports that he has noticed a "spot" with drainage and his friends told him to get checked out.  He denies fevers, nausea, vomiting, diarrhea.  No recent pain to the area.  He reports when he took the bandage off he noticed some pale pink drainage from the wound.  No recent increase in swelling.  He reports he does not know when he is supposed to follow up for his hand or with whom.  He reports he has not been taking any antibiotics post surgery, has not been on steroids recently for his MS.   Past Medical History:  Diagnosis Date  . Multiple sclerosis [facility])     Patient Active Problem List   Diagnosis Date Noted  . Finger injury 04/27/2017    Past Surgical History:  Procedure Laterality Date  . AMPUTATION Left 04/27/2017   Procedure: AMPUTATION DIGIT REVISION LEFT MIDDLE FINGER;  Surgeon: Knute Neu, MD;  Location: [facility] OR;  Service: Orthopedics;  Laterality: Left;       Home Medications    Prior to Admission medications   Medication Sig Start Date End Date Taking? Authorizing Provider  AMPYRA 10 MG TB12 Take 10 mg by mouth 2 (two) times daily. 04/12/17   [provider]  cephALEXin (KEFLEX) 500 MG capsule Take 1 capsule (500 mg total) by mouth 4 (four) times daily. 05/02/17 05/07/17  Cristina Gong, PA-C  HYDROcodone-acetaminophen (NORCO) 7.5-325 MG tablet Take 1-2 tablets by mouth every 6 (six) hours as needed for moderate pain. 04/27/17   Knute Neu, MD  oxybutynin (DITROPAN) 5 MG tablet Take 5 mg by mouth 2 (two) times daily. 04/16/17   [provider]    Family History History reviewed. No pertinent  family history.  Social History Social History  Substance Use Topics  . Smoking status: Current Every Day Smoker    Packs/day: 0.50    Types: Cigarettes  . Smokeless tobacco: Never Used  . Alcohol use Yes     Allergies   Patient has no known allergies.   Review of Systems Review of Systems  Constitutional: Negative for chills, fatigue and fever.  Respiratory: Negative for cough.   Gastrointestinal: Negative for nausea.  Skin: Positive for wound. Negative for color change and pallor.  Neurological: Negative for weakness.     Physical Exam Updated Vital Signs BP 120/87 (BP Location: Right Arm)   Pulse (!) 58   Temp 98.4 F (36.9 C) (Oral)   Resp 16   SpO2 99%   Physical Exam  Constitutional: He appears well-developed and well-nourished. No distress.  HENT:  Head: Normocephalic.  Eyes: No scleral icterus.  Cardiovascular: Normal rate.   Pulmonary/Chest: Effort normal.  Abdominal: He exhibits no distension.  Neurological: He is alert.  Patient reports he is at his neurologic baseline with no recent changes.  Abnormal tone, coordination and motion.   Skin: Skin is warm and dry. Capillary refill takes less than 2 seconds. He is not diaphoretic. No pallor.  Wound to left distal middle finger is clean, dry, intact with out obvious drainage or dehiscence.  Sutures  are present.  There is no surrounding redness, fluctuance, or abnormal edema.    Psychiatric: He has a normal mood and affect. His behavior is normal.  Nursing note and vitals reviewed.    ED Treatments / Results  Labs (all labs ordered are listed, but only abnormal results are displayed) Labs Reviewed - No data to display  EKG  EKG Interpretation None       Radiology No results found.  Procedures Procedures (including critical care time)  Medications Ordered in ED Medications - No data to display   Initial Impression / Assessment and Plan / ED Course  I have reviewed the triage vital signs  and the nursing notes.  Pertinent labs & imaging results that were available during my care of the patient were reviewed by me and considered in my medical decision making (see chart for details).     Nathan Marsh. presents for a wound check after amputation of the distal middle finger on his left hand. His wound appears to be healing well with no obvious evidence of infection or dehiscence.  Chart was reviewed and patient was re-prescribed his keflex and was instructed to fill and take his antibiotics.  He was given the contact information for his follow up and informed, consistent with notes on chart review, to follow up around the 5/15.     At this time there does not appear to be any evidence of an acute emergency medical condition and the patient appears stable for discharge with appropriate outpatient follow up.Diagnosis was discussed with patient who verbalizes understanding and is agreeable to discharge. Pt case discussed with Dr. Effie Shy who saw the patient and agrees with my plan.    Final Clinical Impressions(s) / ED Diagnoses   Final diagnoses:  Visit for wound check    New Prescriptions Discharge Medication List as of 05/02/2017  3:59 PM       Cristina Gong, PA-C 05/02/17 1801    Mancel Bale, MD 05/05/17 [phone]

## 2017-05-02 NOTE — ED Triage Notes (Signed)
Pt here to have left middle finger assessed.  Wants to make sure healing properly.  Small opening in incision on edge of incision.  Pt states he was not given any discharge prescriptions and all belongings (clothes, phone, money, credit card) were not returned to him at discharge.

## 2017-05-02 NOTE — ED Notes (Addendum)
Nathan Marsh comes into for a wound recheck after having tip of left middle finger amputated last week, states to EMS that brought him in that  He did not get his clothes, $ or phone back, before he left, . I found  2 bags that had Nathan Marsh sticker on them under desk back opn pod c by room 29. It had all of pts belongings , $ ,clothes, shoes and phone. Finger wound is healed edges well approximated and no redness, still tender to touch, pt arrives with no dressing on finger

## 2017-05-02 NOTE — Discharge Instructions (Signed)
You were given a prescription for keflex, an antibiotic.  This is the medication you were supposed to start after surgery.  Please start taking it now.

## 2017-05-03 ENCOUNTER — Encounter: Payer: Self-pay | Admitting: Family Medicine

## 2017-05-03 ENCOUNTER — Encounter (HOSPITAL_COMMUNITY): Payer: Self-pay | Admitting: Emergency Medicine

## 2017-05-04 NOTE — Op Note (Signed)
NAMETRAVONTA, GILL NO.:  [id number]  MEDICAL RECORD NO.:  [id number]  LOCATION:  [facility]                         FACILITY:  [facility]  PHYSICIAN:  Johnette Abraham, MD    DATE OF BIRTH:  October 21, 1976  DATE OF PROCEDURE:  04/27/2017 DATE OF DISCHARGE:                              OPERATIVE REPORT   PREOPERATIVE DIAGNOSIS:  Traumatic amputation of the distal left long finger.  POSTOPERATIVE DIAGNOSIS:  Traumatic amputation of the distal left long finger.  PROCEDURE:  Revision amputation with local advancement flap of the left long finger.  ANESTHESIA:  General.  COMPLICATIONS:  No complications.  ESTIMATED BLOOD LOSS:  Minimal.  INDICATIONS:  Mr. Hail is a 41 year old gentleman who somehow got his finger caught in between the table.  The table fell.  Left long finger tip was completely amputated.  Risks, benefits, and alternatives of surgical options were discussed in detail with the patient including a thenar flap or revision amputation.  The patient desired revision amputation.  Consent was obtained.  PROCEDURE IN DETAIL:  The patient was taken to the operating room, placed supine on the operating room table.  A time-out was performed. General anesthesia was administered without difficulty.  The left upper extremity was prepped and draped in normal sterile fashion.  A tourniquet was used on the upper arm.  The arm was exsanguinated, and the tourniquet was inflated.  The wound was evaluated.  It was a tangential more volar based amputation through the distal phalanx at the base of the nail.  The nail bed was excised.  The soft tissues around the distal phalanx were freed up.  The distal phalanx was trimmed back to almost the DIP joint.  The edges were rongeured.  The neurovascular bundles were pulled and then cauterized so they could retract.  Next, the skin was fashioned to advance over the end of the bone.  Once this is performed, the skin was trimmed  to make a nice aesthetic closure. The tourniquet was released.  Hemostasis was controlled.  The remainder of the skin over the bone at the finger tip was fashioned to make good functional and esthetic result.  The wound was closed with multiple interrupted 5-0 nylon sutures.  Afterwards, the wound was dressed with a nice sterile dressing.  The patient tolerated the procedure well, was taken to recovery room in stable condition.    Johnette Abraham, MD    HCC/MEDQ  D:  05/04/2017  T:  05/04/2017  Job:  [id number]

## 2017-05-10 NOTE — Discharge Summary (Signed)
NAME:  CHAVEZ, ROSOL                   ACCOUNT NO.:  MEDICAL RECORD NO.:  [id number]  LOCATION:                                 FACILITY:  PHYSICIAN:  Johnette Abraham, MD    DATE OF BIRTH:  1976-06-25  DATE OF ADMISSION:  04/27/2017 DATE OF DISCHARGE:  04/28/2017                              DISCHARGE SUMMARY   HOSPITAL COURSE:  The patient was admitted overnight on Apr 27, 2017 after he had surgery.  Please see the operative note for details. Postoperatively, the patient did not have any transportation home and therefore was admitted for 23-hour observation.  The patient was discharged the following day in good condition.  FINAL DISCHARGE DIAGNOSIS:  Status post revision amputation of the left long finger.  DISCHARGE INSTRUCTIONS:  The patient is to keep his dressing clean, dry, and intact.  He is to call my office the following day for an appointment to be seen in 1 week.  He is given a prescription for Keflex 1 p.o. q.i.d. for 5 days.  He is also given a prescription for pain medications, Norco 1-2 tablets p.o. q.4-6 hours p.r.n. for pain.  He is to resume all of his preoperative medications and diet.     Johnette Abraham, MD     HCC/MEDQ  D:  05/10/2017  T:  05/10/2017  Job:  [id number]

## 2017-05-12 DIAGNOSIS — G35 Multiple sclerosis: Secondary | ICD-10-CM | POA: Diagnosis not present

## 2017-06-14 DIAGNOSIS — Z79899 Other long term (current) drug therapy: Secondary | ICD-10-CM | POA: Diagnosis not present

## 2017-06-14 DIAGNOSIS — G35 Multiple sclerosis: Secondary | ICD-10-CM | POA: Diagnosis not present

## 2017-07-12 DIAGNOSIS — Z79899 Other long term (current) drug therapy: Secondary | ICD-10-CM | POA: Diagnosis not present

## 2017-07-12 DIAGNOSIS — G35 Multiple sclerosis: Secondary | ICD-10-CM | POA: Diagnosis not present

## 2017-07-23 DIAGNOSIS — G35 Multiple sclerosis: Secondary | ICD-10-CM | POA: Diagnosis not present

## 2017-08-19 NOTE — Addendum Note (Signed)
Addendum  created 08/19/17 1354 by Kipp Brood, MD   Sign clinical note

## 2017-08-23 DIAGNOSIS — Z79899 Other long term (current) drug therapy: Secondary | ICD-10-CM | POA: Diagnosis not present

## 2017-08-23 DIAGNOSIS — G35 Multiple sclerosis: Secondary | ICD-10-CM | POA: Diagnosis not present

## 2017-08-25 DIAGNOSIS — G35 Multiple sclerosis: Secondary | ICD-10-CM | POA: Diagnosis not present

## 2017-09-10 ENCOUNTER — Encounter (HOSPITAL_COMMUNITY): Payer: Self-pay | Admitting: Emergency Medicine

## 2017-09-10 ENCOUNTER — Emergency Department (HOSPITAL_COMMUNITY)
Admission: EM | Admit: 2017-09-10 | Discharge: 2017-09-10 | Disposition: A | Discharge status: Home / Self Care | Payer: Medicare Other | Attending: Emergency Medicine | Admitting: Emergency Medicine

## 2017-09-10 ENCOUNTER — Emergency Department (HOSPITAL_COMMUNITY): Payer: Medicare Other

## 2017-09-10 DIAGNOSIS — Y929 Unspecified place or not applicable: Secondary | ICD-10-CM | POA: Insufficient documentation

## 2017-09-10 DIAGNOSIS — S0101XA Laceration without foreign body of scalp, initial encounter: Secondary | ICD-10-CM | POA: Diagnosis not present

## 2017-09-10 DIAGNOSIS — S0181XA Laceration without foreign body of other part of head, initial encounter: Secondary | ICD-10-CM | POA: Diagnosis not present

## 2017-09-10 DIAGNOSIS — Y939 Activity, unspecified: Secondary | ICD-10-CM | POA: Diagnosis not present

## 2017-09-10 DIAGNOSIS — Z23 Encounter for immunization: Secondary | ICD-10-CM | POA: Insufficient documentation

## 2017-09-10 DIAGNOSIS — Z79899 Other long term (current) drug therapy: Secondary | ICD-10-CM | POA: Diagnosis not present

## 2017-09-10 DIAGNOSIS — F17210 Nicotine dependence, cigarettes, uncomplicated: Secondary | ICD-10-CM | POA: Diagnosis not present

## 2017-09-10 DIAGNOSIS — S0990XA Unspecified injury of head, initial encounter: Secondary | ICD-10-CM | POA: Diagnosis not present

## 2017-09-10 DIAGNOSIS — S0450XA Injury of facial nerve, unspecified side, initial encounter: Secondary | ICD-10-CM | POA: Diagnosis not present

## 2017-09-10 DIAGNOSIS — Y998 Other external cause status: Secondary | ICD-10-CM | POA: Insufficient documentation

## 2017-09-10 DIAGNOSIS — Z8739 Personal history of other diseases of the musculoskeletal system and connective tissue: Secondary | ICD-10-CM | POA: Diagnosis not present

## 2017-09-10 DIAGNOSIS — S098XXA Other specified injuries of head, initial encounter: Secondary | ICD-10-CM | POA: Diagnosis not present

## 2017-09-10 MED ORDER — LIDOCAINE HCL (PF) 1 % IJ SOLN
10.0000 mL | Freq: Once | INTRAMUSCULAR | Status: AC
  Administered 2017-09-10: 10 mL via INTRADERMAL
  Filled 2017-09-10: qty 10

## 2017-09-10 MED ORDER — TETANUS-DIPHTH-ACELL PERTUSSIS 5-2.5-18.5 LF-MCG/0.5 IM SUSP
0.5000 mL | Freq: Once | INTRAMUSCULAR | Status: AC
  Administered 2017-09-10: 0.5 mL via INTRAMUSCULAR
  Filled 2017-09-10: qty 0.5

## 2017-09-10 MED ORDER — LIDOCAINE HCL (PF) 1 % IJ SOLN
INTRAMUSCULAR | Status: AC
  Filled 2017-09-10: qty 5
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
## 2017-09-10 NOTE — ED Notes (Signed)
Pt given sand which bag and beverage.

## 2017-09-10 NOTE — ED Triage Notes (Signed)
Pt arrives via [facility] from home, ems reports pt fell out of his wheelchair today, striking the right side of his face. Pt has lac near rt eyebrow, bleeding controlled. No loc, no pain.

## 2017-09-10 NOTE — Discharge Instructions (Signed)
Keep the wound clean and dry for the first 24 hours. After that you may gently clean the wound with soap and water. Make sure to pat dry the wound before covering it with any dressing. You can use topical antibiotic ointment and bandage. Ice and elevate for pain relief.   You can take Tylenol or Ibuprofen as directed for pain. You can alternate Tylenol and Ibuprofen every 4 hours for additional pain relief.   Return to the Emergency Department, your primary care doctor, or the [facility] in 7 days for suture removal.   Monitor closely for any signs of infection. Return to the Emergency Department for any worsening redness/swelling of the area that begins to spread, drainage from the site, worsening pain, fever or any other worsening or concerning symptoms.    

## 2017-09-17 ENCOUNTER — Emergency Department (HOSPITAL_COMMUNITY)
Admission: EM | Admit: 2017-09-17 | Discharge: 2017-09-17 | Disposition: A | Discharge status: Home / Self Care | Payer: Medicare Other | Attending: Emergency Medicine | Admitting: Emergency Medicine

## 2017-09-17 DIAGNOSIS — F17210 Nicotine dependence, cigarettes, uncomplicated: Secondary | ICD-10-CM | POA: Diagnosis not present

## 2017-09-17 DIAGNOSIS — Y929 Unspecified place or not applicable: Secondary | ICD-10-CM | POA: Insufficient documentation

## 2017-09-17 DIAGNOSIS — S01111D Laceration without foreign body of right eyelid and periocular area, subsequent encounter: Secondary | ICD-10-CM | POA: Diagnosis not present

## 2017-09-17 DIAGNOSIS — Z4802 Encounter for removal of sutures: Secondary | ICD-10-CM | POA: Diagnosis not present

## 2017-09-17 DIAGNOSIS — G35 Multiple sclerosis: Secondary | ICD-10-CM | POA: Insufficient documentation

## 2017-09-17 DIAGNOSIS — W050XXD Fall from non-moving wheelchair, subsequent encounter: Secondary | ICD-10-CM | POA: Insufficient documentation

## 2017-09-17 DIAGNOSIS — Z79899 Other long term (current) drug therapy: Secondary | ICD-10-CM | POA: Insufficient documentation

## 2017-09-17 DIAGNOSIS — Y998 Other external cause status: Secondary | ICD-10-CM | POA: Diagnosis not present

## 2017-09-17 DIAGNOSIS — Y939 Activity, unspecified: Secondary | ICD-10-CM | POA: Insufficient documentation

## 2017-09-17 NOTE — ED Triage Notes (Signed)
Pt reports needing to get his stitches taken out on the right side of his face, denies any symptoms at this time.

## 2017-09-17 NOTE — ED Provider Notes (Signed)
[facility]-EMERGENCY DEPT Provider Note   CSN: [id number] Arrival date & time: 09/17/17  1040  History   Chief Complaint Chief Complaint  Patient presents with  . Suture / Staple Removal    HPI Nathan Marsh is a 41 y.o. male presenting for suture removal. Larey Seat out of wheelchair 6 days ago and had cut over right eye. No issues with the wound healing. No fevers or chills.   HPI  Past Medical History:  Diagnosis Date  . Multiple sclerosis [facility])     Patient Active Problem List   Diagnosis Date Noted  . Finger injury 04/27/2017  . MULTIPLE SCLEROSIS 10/26/2007    Past Surgical History:  Procedure Laterality Date  . AMPUTATION Left 04/27/2017   Procedure: AMPUTATION DIGIT REVISION LEFT MIDDLE FINGER;  Surgeon: Knute Neu, MD;  Location: [facility] OR;  Service: Orthopedics;  Laterality: Left;       Home Medications    Prior to Admission medications   Medication Sig Start Date End Date Taking? Authorizing Provider  AMPYRA 10 MG TB12 Take 10 mg by mouth 2 (two) times daily. 04/12/17   [provider]  HYDROcodone-acetaminophen (NORCO) 7.5-325 MG tablet Take 1-2 tablets by mouth every 6 (six) hours as needed for moderate pain. 04/27/17   Knute Neu, MD  natalizumab (TYSABRI) 300 MG/15ML injection Inject into the vein every 30 (thirty) days.    [provider]  oxybutynin (DITROPAN) 5 MG tablet Take 5 mg by mouth 2 (two) times daily.    [provider]  oxybutynin (DITROPAN) 5 MG tablet Take 5 mg by mouth 2 (two) times daily. 04/16/17   [provider]    Family History No family history on file.  Social History Social History  Substance Use Topics  . Smoking status: Current Every Day Smoker    Packs/day: 0.50    Types: Cigarettes  . Smokeless tobacco: Never Used  . Alcohol use Yes     Allergies   Patient has no known allergies.   Review of Systems Review of Systems  Constitutional: Negative.   HENT: Negative.   Respiratory:  Negative.   Cardiovascular: Negative.   All other systems reviewed and are negative.    Physical Exam Updated Vital Signs BP 106/71 (BP Location: Left Arm)   Pulse 78   Temp 98.3 F (36.8 C) (Oral)   Resp 18   SpO2 100%   Physical Exam  Constitutional: He is oriented to person, place, and time. He appears well-developed and well-nourished. No distress.  HENT:  Head: Normocephalic and atraumatic.  Pulmonary/Chest: Effort normal. No respiratory distress.  Neurological: He is alert and oriented to person, place, and time.  Wheelchair bound  Skin: Skin is warm and dry. No rash noted.     ED Treatments / Results  Labs (all labs ordered are listed, but only abnormal results are displayed) Labs Reviewed - No data to display  EKG  EKG Interpretation None       Radiology No results found.  Procedures .Suture Removal Date/Time: 09/17/2017 12:08 PM Performed by: Tillman Sers Authorized by: Charlynne Pander   Consent:    Consent obtained:  Verbal   Consent given by:  Patient   Risks discussed:  Wound separation   Alternatives discussed:  Delayed treatment Location:    Location:  Head/neck   Head/neck location:  Eyebrow   Eyebrow location:  R eyebrow Procedure details:    Wound appearance:  No signs of infection   Number of sutures  removed:  6 Post-procedure details:    Post-removal:  Antibiotic ointment applied   Patient tolerance of procedure:  Tolerated well, no immediate complications   (including critical care time)  Medications Ordered in ED Medications - No data to display   Initial Impression / Assessment and Plan / ED Course  I have reviewed the triage vital signs and the nursing notes.  Pertinent labs & imaging results that were available during my care of the patient were reviewed by me and considered in my medical decision making (see chart for details).     41 year old male presenting for suture removal. 6 sutures removed without  complication. Antibiotic ointment applied. Patient supplied with additional ointment to use over next several days. Follow up with PCP if wound reopens or becomes infected. Stable for discharge home. Patient verbalized understanding and agreement with plan.   Final Clinical Impressions(s) / ED Diagnoses   Final diagnoses:  Visit for suture removal    New Prescriptions New Prescriptions   No medications on file     Tillman Sers, DO 09/17/17 1209    Charlynne Pander, MD 09/18/17 [phone]

## 2017-09-20 DIAGNOSIS — G35 Multiple sclerosis: Secondary | ICD-10-CM | POA: Diagnosis not present

## 2017-09-22 DIAGNOSIS — G35 Multiple sclerosis: Secondary | ICD-10-CM | POA: Diagnosis not present

## 2017-09-27 DIAGNOSIS — G35 Multiple sclerosis: Secondary | ICD-10-CM | POA: Diagnosis not present

## 2017-09-27 DIAGNOSIS — Z7689 Persons encountering health services in other specified circumstances: Secondary | ICD-10-CM | POA: Diagnosis not present

## 2017-10-25 DIAGNOSIS — G35 Multiple sclerosis: Secondary | ICD-10-CM | POA: Diagnosis not present

## 2017-11-25 DIAGNOSIS — G35 Multiple sclerosis: Secondary | ICD-10-CM | POA: Diagnosis not present

## 2017-11-29 DIAGNOSIS — G35 Multiple sclerosis: Secondary | ICD-10-CM | POA: Diagnosis not present

## 2018-01-11 DIAGNOSIS — G35 Multiple sclerosis: Secondary | ICD-10-CM | POA: Diagnosis not present

## 2018-02-08 DIAGNOSIS — Z79899 Other long term (current) drug therapy: Secondary | ICD-10-CM | POA: Diagnosis not present

## 2018-02-08 DIAGNOSIS — G35 Multiple sclerosis: Secondary | ICD-10-CM | POA: Diagnosis not present

## 2018-02-17 DIAGNOSIS — G35 Multiple sclerosis: Secondary | ICD-10-CM | POA: Diagnosis not present

## 2018-03-03 DIAGNOSIS — G35 Multiple sclerosis: Secondary | ICD-10-CM | POA: Diagnosis not present

## 2018-03-08 DIAGNOSIS — G35 Multiple sclerosis: Secondary | ICD-10-CM | POA: Diagnosis not present

## 2018-03-08 DIAGNOSIS — Z79899 Other long term (current) drug therapy: Secondary | ICD-10-CM | POA: Diagnosis not present

## 2018-03-28 DIAGNOSIS — G35 Multiple sclerosis: Secondary | ICD-10-CM | POA: Diagnosis not present

## 2018-04-05 DIAGNOSIS — G35 Multiple sclerosis: Secondary | ICD-10-CM | POA: Diagnosis not present

## 2018-04-08 DIAGNOSIS — I10 Essential (primary) hypertension: Secondary | ICD-10-CM | POA: Diagnosis not present

## 2018-04-08 DIAGNOSIS — Z125 Encounter for screening for malignant neoplasm of prostate: Secondary | ICD-10-CM | POA: Diagnosis not present

## 2018-04-08 DIAGNOSIS — Z0000 Encounter for general adult medical examination without abnormal findings: Secondary | ICD-10-CM | POA: Diagnosis not present

## 2018-04-08 DIAGNOSIS — E785 Hyperlipidemia, unspecified: Secondary | ICD-10-CM | POA: Diagnosis not present

## 2018-04-08 DIAGNOSIS — G35 Multiple sclerosis: Secondary | ICD-10-CM | POA: Diagnosis not present

## 2018-04-28 DIAGNOSIS — G35 Multiple sclerosis: Secondary | ICD-10-CM | POA: Diagnosis not present

## 2018-05-09 DIAGNOSIS — G35 Multiple sclerosis: Secondary | ICD-10-CM | POA: Diagnosis not present

## 2018-06-07 DIAGNOSIS — G35 Multiple sclerosis: Secondary | ICD-10-CM | POA: Diagnosis not present

## 2018-06-23 DIAGNOSIS — G35 Multiple sclerosis: Secondary | ICD-10-CM | POA: Diagnosis not present

## 2018-07-25 DIAGNOSIS — G35 Multiple sclerosis: Secondary | ICD-10-CM | POA: Diagnosis not present

## 2018-08-01 DIAGNOSIS — G35 Multiple sclerosis: Secondary | ICD-10-CM | POA: Diagnosis not present

## 2018-08-18 DIAGNOSIS — G35 Multiple sclerosis: Secondary | ICD-10-CM | POA: Diagnosis not present

## 2018-09-12 DIAGNOSIS — G35 Multiple sclerosis: Secondary | ICD-10-CM | POA: Diagnosis not present

## 2018-09-12 DIAGNOSIS — Z79899 Other long term (current) drug therapy: Secondary | ICD-10-CM | POA: Diagnosis not present

## 2018-09-19 DIAGNOSIS — R600 Localized edema: Secondary | ICD-10-CM | POA: Diagnosis not present

## 2018-09-19 DIAGNOSIS — G35 Multiple sclerosis: Secondary | ICD-10-CM | POA: Diagnosis not present

## 2018-09-22 DIAGNOSIS — G35 Multiple sclerosis: Secondary | ICD-10-CM | POA: Diagnosis not present

## 2018-10-11 DIAGNOSIS — G35 Multiple sclerosis: Secondary | ICD-10-CM | POA: Diagnosis not present

## 2018-10-12 DIAGNOSIS — G35 Multiple sclerosis: Secondary | ICD-10-CM | POA: Diagnosis not present

## 2018-10-12 DIAGNOSIS — R270 Ataxia, unspecified: Secondary | ICD-10-CM | POA: Diagnosis not present

## 2018-10-12 DIAGNOSIS — R251 Tremor, unspecified: Secondary | ICD-10-CM | POA: Diagnosis not present

## 2018-10-26 DIAGNOSIS — G35 Multiple sclerosis: Secondary | ICD-10-CM | POA: Diagnosis not present

## 2018-10-27 DIAGNOSIS — G35 Multiple sclerosis: Secondary | ICD-10-CM | POA: Diagnosis not present

## 2018-11-09 DIAGNOSIS — G35 Multiple sclerosis: Secondary | ICD-10-CM | POA: Diagnosis not present

## 2018-11-16 DIAGNOSIS — R600 Localized edema: Secondary | ICD-10-CM | POA: Diagnosis not present

## 2018-11-16 DIAGNOSIS — Z0000 Encounter for general adult medical examination without abnormal findings: Secondary | ICD-10-CM | POA: Diagnosis not present

## 2018-11-16 DIAGNOSIS — G35 Multiple sclerosis: Secondary | ICD-10-CM | POA: Diagnosis not present

## 2018-12-08 DIAGNOSIS — G35 Multiple sclerosis: Secondary | ICD-10-CM | POA: Diagnosis not present

## 2018-12-16 DIAGNOSIS — G35 Multiple sclerosis: Secondary | ICD-10-CM | POA: Diagnosis not present

## 2018-12-16 DIAGNOSIS — R600 Localized edema: Secondary | ICD-10-CM | POA: Diagnosis not present

## 2018-12-20 ENCOUNTER — Other Ambulatory Visit: Payer: Self-pay

## 2018-12-20 ENCOUNTER — Encounter (HOSPITAL_COMMUNITY): Payer: Self-pay

## 2018-12-20 ENCOUNTER — Emergency Department (HOSPITAL_COMMUNITY)
Admission: EM | Admit: 2018-12-20 | Discharge: 2018-12-20 | Disposition: A | Discharge status: Home / Self Care | Payer: Medicare Other | Attending: Emergency Medicine | Admitting: Emergency Medicine

## 2018-12-20 ENCOUNTER — Emergency Department (HOSPITAL_BASED_OUTPATIENT_CLINIC_OR_DEPARTMENT_OTHER): Payer: Medicare Other

## 2018-12-20 DIAGNOSIS — G35 Multiple sclerosis: Secondary | ICD-10-CM | POA: Insufficient documentation

## 2018-12-20 DIAGNOSIS — R600 Localized edema: Secondary | ICD-10-CM | POA: Diagnosis present

## 2018-12-20 DIAGNOSIS — Z79899 Other long term (current) drug therapy: Secondary | ICD-10-CM | POA: Diagnosis not present

## 2018-12-20 DIAGNOSIS — F1290 Cannabis use, unspecified, uncomplicated: Secondary | ICD-10-CM | POA: Insufficient documentation

## 2018-12-20 DIAGNOSIS — M7989 Other specified soft tissue disorders: Secondary | ICD-10-CM

## 2018-12-20 DIAGNOSIS — F17210 Nicotine dependence, cigarettes, uncomplicated: Secondary | ICD-10-CM | POA: Diagnosis not present

## 2018-12-20 DIAGNOSIS — I878 Other specified disorders of veins: Secondary | ICD-10-CM | POA: Diagnosis not present

## 2018-12-20 NOTE — Discharge Instructions (Addendum)
1.  Try to elevate your legs as much as possible above the level of your heart.  Wear your compression hose. 2.  Discuss referral to wound care clinic with your family doctor.  They can be helpful in managing venous stasis 3.  Ultrasounds were done on your legs and there is no sign of blood clot in the legs.

## 2018-12-20 NOTE — ED Provider Notes (Signed)
[name] EMERGENCY DEPARTMENT Provider Note   CSN: [id number] Arrival date & time: 12/20/18  4098     History   Chief Complaint Chief Complaint  Patient presents with  . Leg Swelling    HPI Nathan Marsh is a 42 y.o. male.  HPI Patient reports he has swelling in both legs.  He reports is been there for quite a while.  He is in a wheelchair due to multiple sclerosis.  He denies he has pain.  He reports his family is concerned and wanted him evaluated.  He denies any chest pain or shortness of breath.  No fever no chills no cough.  No abdominal pain.  He reports overall he feels good.  He is not familiar with the term venous stasis but reports that he does have compression hose and he wears them fairly often.  He has never been to wound care clinic.  He reports his doctor has discussed making a referral for him regarding the lower extremity swelling. Past Medical History:  Diagnosis Date  . Multiple sclerosis [facility])     Patient Active Problem List   Diagnosis Date Noted  . Finger injury 04/27/2017  . MULTIPLE SCLEROSIS 10/26/2007    Past Surgical History:  Procedure Laterality Date  . AMPUTATION Left 04/27/2017   Procedure: AMPUTATION DIGIT REVISION LEFT MIDDLE FINGER;  Surgeon: Knute Neu, MD;  Location: [facility] OR;  Service: Orthopedics;  Laterality: Left;        Home Medications    Prior to Admission medications   Medication Sig Start Date End Date Taking? Authorizing Provider  AMPYRA 10 MG TB12 Take 10 mg by mouth 2 (two) times daily. 04/12/17   [provider]  HYDROcodone-acetaminophen (NORCO) 7.5-325 MG tablet Take 1-2 tablets by mouth every 6 (six) hours as needed for moderate pain. 04/27/17   Knute Neu, MD  natalizumab (TYSABRI) 300 MG/15ML injection Inject into the vein every 30 (thirty) days.    [provider]  oxybutynin (DITROPAN) 5 MG tablet Take 5 mg by mouth 2 (two) times daily.    [provider]    oxybutynin (DITROPAN) 5 MG tablet Take 5 mg by mouth 2 (two) times daily. 04/16/17   [provider]    Family History History reviewed. No pertinent family history.  Social History Social History   Tobacco Use  . Smoking status: Current Every Day Smoker    Packs/day: 0.50    Types: Cigarettes  . Smokeless tobacco: Never Used  Substance Use Topics  . Alcohol use: Yes  . Drug use: Yes    Types: Marijuana     Allergies   Patient has no known allergies.   Review of Systems Review of Systems 10 Systems reviewed and are negative for acute change except as noted in the HPI.   Physical Exam Updated Vital Signs BP 128/79 (BP Location: Left Arm)   Pulse 63   Temp 98 F (36.7 C)   Resp 15   Ht 5\' 5"  (1.651 m)   Wt 60.8 kg   SpO2 100%   BMI 22.30 kg/m   Physical Exam Constitutional:      Comments: Patient is alert and appropriate.  No respiratory distress.  Nontoxic.  HENT:     Head: Normocephalic and atraumatic.  Cardiovascular:     Rate and Rhythm: Normal rate and regular rhythm.  Pulmonary:     Effort: Pulmonary effort is normal.     Breath sounds: Normal breath sounds.  Abdominal:  General: Abdomen is flat. There is no distension.     Palpations: Abdomen is soft.     Tenderness: There is no abdominal tenderness. There is no guarding.  Musculoskeletal:     Comments: Bilateral lower extremities have 2+ pitting edema that is symmetric.  Dorsalis pedis pulses are 2+ and symmetric.  Patient has skin thinning of the lower extremities.  No open wounds.  Toenails slightly thickened.  Skin:    General: Skin is warm and dry.  Neurological:     Comments: Patient is alert with normal mental status.  Speech content is normal.  Patient has slight speech impediment and lower extremity dysfunction consistent with his primary MS      ED Treatments / Results  Labs (all labs ordered are listed, but only abnormal results are displayed) Labs Reviewed - No data  to display  EKG None  Radiology Vas [place] Lower Extremity Venous (dvt)  Result Date: 12/20/2018  Lower Venous Study Indications: Swelling.  Performing Technologist: Gertie Fey MHA, RDMS, RVT, RDCS  Examination Guidelines: A complete evaluation includes B-mode imaging, spectral Doppler, color Doppler, and power Doppler as needed of all accessible portions of each vessel. Bilateral testing is considered an integral part of a complete examination. Limited examinations for reoccurring indications may be performed as noted.  Right Venous Findings: +---------+---------------+---------+-----------+----------+--------------+          CompressibilityPhasicitySpontaneityPropertiesSummary        +---------+---------------+---------+-----------+----------+--------------+ CFV      Full           No       Yes                  Pulsatile flow +---------+---------------+---------+-----------+----------+--------------+ SFJ      Full                                                        +---------+---------------+---------+-----------+----------+--------------+ FV Prox  Full                                                        +---------+---------------+---------+-----------+----------+--------------+ FV Mid   Full                                                        +---------+---------------+---------+-----------+----------+--------------+ FV DistalFull                                                        +---------+---------------+---------+-----------+----------+--------------+ PFV      Full                                                        +---------+---------------+---------+-----------+----------+--------------+ POP  Full           No       Yes                  Pulsatile flow +---------+---------------+---------+-----------+----------+--------------+ PTV      Full                                                         +---------+---------------+---------+-----------+----------+--------------+ PERO     Full                                                        +---------+---------------+---------+-----------+----------+--------------+  Left Venous Findings: +---------+---------------+---------+-----------+----------+--------------+          CompressibilityPhasicitySpontaneityPropertiesSummary        +---------+---------------+---------+-----------+----------+--------------+ CFV      Full           No       Yes                  Pulsatile flow +---------+---------------+---------+-----------+----------+--------------+ SFJ      Full                                                        +---------+---------------+---------+-----------+----------+--------------+ FV Prox  Full                                                        +---------+---------------+---------+-----------+----------+--------------+ FV Mid   Full                                                        +---------+---------------+---------+-----------+----------+--------------+ FV DistalFull                                                        +---------+---------------+---------+-----------+----------+--------------+ PFV      Full                                                        +---------+---------------+---------+-----------+----------+--------------+ POP      Full           No       Yes                  Pulsatile flow +---------+---------------+---------+-----------+----------+--------------+ PTV      Full                                                        +---------+---------------+---------+-----------+----------+--------------+  PERO     Full                                                        +---------+---------------+---------+-----------+----------+--------------+    Summary: Right: There is no evidence of deep vein thrombosis in the lower extremity. No cystic  structure found in the popliteal fossa. Left: There is no evidence of deep vein thrombosis in the lower extremity. No cystic structure found in the popliteal fossa.  *See table(s) above for measurements and observations.    Preliminary     Procedures Procedures (including critical care time)  Medications Ordered in ED Medications - No data to display   Initial Impression / Assessment and Plan / ED Course  I have reviewed the triage vital signs and the nursing notes.  Pertinent labs & imaging results that were available during my care of the patient were reviewed by me and considered in my medical decision making (see chart for details).    Patient is alert and clinically well.  He has had venous stasis for some time.  He is not familiar with the term but does describe the symptoms.  Ultrasounds obtained and no interim development of DVT.  Patient otherwise feels well.  He is counseled on management of venous stasis.  Discharged in good condition with known multiple sclerosis but at baseline.  Final Clinical Impressions(s) / ED Diagnoses   Final diagnoses:  Chronic venous stasis    ED Discharge Orders    None       Arby Barrette, MD 12/20/18 1054

## 2018-12-20 NOTE — ED Notes (Signed)
Patient transported to Ultrasound 

## 2018-12-20 NOTE — Progress Notes (Signed)
*  Preliminary Results* Bilateral lower extremity venous duplex completed. Bilateral lower extremities are negative for deep vein thrombosis. There is no evidence of Baker's cyst bilaterally.  12/20/2018  Gertie FeyMichelle Sangita Zani, MHA, RVT, RDCS, RDMS

## 2018-12-20 NOTE — ED Triage Notes (Signed)
Pt arrives to ED via [facility] bus with complaints of bilateral lower leg swelling for "a long time" (patient has no complaints but was told to come here by family). Pt has hx of MS and spends long hours in his wheelchair, denies known cardiac hx. Edema non-pitting below knees bilaterally, pulses paplable, no redness or tenderness present, pt states he has compression socks at home that he wears often but did not wear them today. Pt placed in position of comfort with call bell in reach, bed locked and lowered.

## 2019-01-03 ENCOUNTER — Emergency Department (HOSPITAL_COMMUNITY)
Admission: EM | Admit: 2019-01-03 | Discharge: 2019-01-03 | Disposition: A | Discharge status: Home / Self Care | Payer: Medicare Other | Attending: Emergency Medicine | Admitting: Emergency Medicine

## 2019-01-03 ENCOUNTER — Encounter (HOSPITAL_COMMUNITY): Payer: Self-pay | Admitting: Emergency Medicine

## 2019-01-03 DIAGNOSIS — F1290 Cannabis use, unspecified, uncomplicated: Secondary | ICD-10-CM | POA: Insufficient documentation

## 2019-01-03 DIAGNOSIS — Y93E8 Activity, other personal hygiene: Secondary | ICD-10-CM | POA: Insufficient documentation

## 2019-01-03 DIAGNOSIS — Y92012 Bathroom of single-family (private) house as the place of occurrence of the external cause: Secondary | ICD-10-CM | POA: Insufficient documentation

## 2019-01-03 DIAGNOSIS — X500XXA Overexertion from strenuous movement or load, initial encounter: Secondary | ICD-10-CM | POA: Diagnosis not present

## 2019-01-03 DIAGNOSIS — Y999 Unspecified external cause status: Secondary | ICD-10-CM | POA: Insufficient documentation

## 2019-01-03 DIAGNOSIS — G35 Multiple sclerosis: Secondary | ICD-10-CM | POA: Diagnosis not present

## 2019-01-03 DIAGNOSIS — F17210 Nicotine dependence, cigarettes, uncomplicated: Secondary | ICD-10-CM | POA: Diagnosis not present

## 2019-01-03 DIAGNOSIS — M25512 Pain in left shoulder: Secondary | ICD-10-CM | POA: Diagnosis present

## 2019-01-03 DIAGNOSIS — S46212A Strain of muscle, fascia and tendon of other parts of biceps, left arm, initial encounter: Secondary | ICD-10-CM | POA: Diagnosis not present

## 2019-01-03 NOTE — Discharge Instructions (Addendum)
You have been seen today for left bicep pain. It is important to keep moving your left arm often to prevent it from getting stiff. Please read and follow all provided instructions.   1. Medications: You can take ibuprofen or Tylenol for the pain, continue usual home medications 2. Treatment: rest, drink plenty of fluids 3. Follow Up: Please follow up with your primary doctor in 2-5 days for discussion of your diagnoses and further evaluation after today's visit; if you do not have a primary care doctor use the resource guide provided to find one; Please return to the ER for any new or worsening symptoms. Please obtain all of your results from medical records or have your doctors office obtain the results - share them with your doctor - you should be seen at your doctors office. Call today to arrange your follow up.   Take medications as prescribed. Please review all of the medicines and only take them if you do not have an allergy to them. Return to the emergency room for worsening condition or new concerning symptoms. Follow up with your regular doctor. If you don't have a regular doctor use one of the numbers below to establish a primary care doctor.  ? ?  You should return to the ER if you develop severe or worsening symptoms.

## 2019-01-03 NOTE — ED Provider Notes (Signed)
[name] EMERGENCY DEPARTMENT Provider Note   CSN: [id number] Arrival date & time: 01/03/19  1105     History   Chief Complaint Chief Complaint  Patient presents with  . Shoulder Pain    HPI Nathan Marsh is a 43 y.o. male with history of multiple sclerosis, presenting to the emergency department with chief complaint of left arm pain x 4 days. The pain is located in his left bicep. He describes the pain as feeling sore. It does not radiate. The pain started after using a bar to pull himself up to use the toilet. He has not taken anything for pain prior to arrival. He has no associated symptoms.   He denies left should pain, elbow pain, decreased ROM, fever, chills. Pt is in a wheelchair due to his MS    Past Medical History:  Diagnosis Date  . Multiple sclerosis [facility])     Patient Active Problem List   Diagnosis Date Noted  . Finger injury 04/27/2017  . MULTIPLE SCLEROSIS 10/26/2007    Past Surgical History:  Procedure Laterality Date  . AMPUTATION Left 04/27/2017   Procedure: AMPUTATION DIGIT REVISION LEFT MIDDLE FINGER;  Surgeon: Knute Neu, MD;  Location: [facility] OR;  Service: Orthopedics;  Laterality: Left;        Home Medications    Prior to Admission medications   Medication Sig Start Date End Date Taking? Authorizing Provider  AMPYRA 10 MG TB12 Take 10 mg by mouth 2 (two) times daily. 04/12/17   [provider]  HYDROcodone-acetaminophen (NORCO) 7.5-325 MG tablet Take 1-2 tablets by mouth every 6 (six) hours as needed for moderate pain. 04/27/17   Knute Neu, MD  natalizumab (TYSABRI) 300 MG/15ML injection Inject into the vein every 30 (thirty) days.    [provider]  oxybutynin (DITROPAN) 5 MG tablet Take 5 mg by mouth 2 (two) times daily.    [provider]  oxybutynin (DITROPAN) 5 MG tablet Take 5 mg by mouth 2 (two) times daily. 04/16/17   [provider]    Family History History reviewed. No  pertinent family history.  Social History Social History   Tobacco Use  . Smoking status: Current Every Day Smoker    Packs/day: 0.50    Types: Cigarettes  . Smokeless tobacco: Never Used  Substance Use Topics  . Alcohol use: Yes  . Drug use: Yes    Types: Marijuana     Allergies   Patient has no known allergies.   Review of Systems Review of Systems  Constitutional: Negative for chills and fever.  Musculoskeletal: Positive for arthralgias. Negative for joint swelling.  Skin: Negative for color change, rash and wound.  Neurological: Negative for weakness and numbness.     Physical Exam Updated Vital Signs BP 118/88 (BP Location: Left Arm)   Pulse (!) 56   Temp 98.2 F (36.8 C) (Oral)   Resp 16   Ht 5\' 5"  (1.651 m)   Wt 60.8 kg   SpO2 100%   BMI 22.30 kg/m   Physical Exam Vitals signs and nursing note reviewed.  Constitutional:      Appearance: He is not ill-appearing or toxic-appearing.  HENT:     Head: Normocephalic and atraumatic.     Nose: Nose normal.  Neck:     Musculoskeletal: Normal range of motion.  Cardiovascular:     Rate and Rhythm: Normal rate and regular rhythm.     Pulses: Normal pulses.  Radial pulses are 2+ on the right side and 2+ on the left side.     Heart sounds: Normal heart sounds.  Pulmonary:     Effort: Pulmonary effort is normal.     Breath sounds: Normal breath sounds.  Musculoskeletal:     Comments: Pt has full ROM in left shoulder, elbow, and wrist. There is no swelling, deformity, tenderness, edema to left shoulder, elbow, and wrist.  Skin:    General: Skin is warm and dry.     Capillary Refill: Capillary refill takes less than 2 seconds.     Findings: No bruising, erythema, lesion or rash.  Neurological:     Mental Status: He is alert. Mental status is at baseline.     Comments: Pt is alert and oriented. Speech content is normal. Pt has slight speech impediment       ED Treatments / Results  Labs (all  labs ordered are listed, but only abnormal results are displayed) Labs Reviewed - No data to display  EKG None  Radiology No results found.  Procedures Procedures (including critical care time)  Medications Ordered in ED Medications - No data to display   Initial Impression / Assessment and Plan / ED Course  I have reviewed the triage vital signs and the nursing notes.  Pertinent labs & imaging results that were available during my care of the patient were reviewed by me and considered in my medical decision making (see chart for details).   Pt is well appearing and alert reporting the pain spontaneously resolved. He only came in today because his family was concerned about his shoulder being seriously injured. He has full ROM in left shoulder, wrist, and elbow. The discomfort is limited to his bicep and feels like a muscular strain. No swelling in the arm to suggest DVT, no erythema to suggest cellulitis. Xray not necessary at this time to rule out Hills-Sachs deformity due to his full ROM in shoulder. Pt advised to take Tylenol or Ibuprofen for pain, can use warm compress for discomfort. Pt is stable at discharge with known multiple sclerosis and he is at his baseline. He has no further questions and is agreeable to the plan.  The patient was discussed with and seen by Dr. Jacqulyn BathLong who agrees with the treatment plan.     Final Clinical Impressions(s) / ED Diagnoses   Final diagnoses:  Biceps muscle strain, left, initial encounter    ED Discharge Orders    None       Kathyrn Lasslbrizze, Brayden Brodhead E, PA-C 01/03/19 2242    Maia PlanLong, Joshua G, MD 01/04/19 [phone]

## 2019-01-03 NOTE — ED Notes (Signed)
Discharge instructions discussed with Pt. Pt verbalized understanding. Pt stable and leaving via wheelchair by [facility] bus.

## 2019-01-03 NOTE — ED Triage Notes (Signed)
Pt reports left shoulder pain x 5 days, states he uses a grab bar to use the bathroom and thinks he may have injured it that way.

## 2019-01-05 DIAGNOSIS — G35 Multiple sclerosis: Secondary | ICD-10-CM | POA: Diagnosis not present

## 2019-01-25 DIAGNOSIS — Z79899 Other long term (current) drug therapy: Secondary | ICD-10-CM | POA: Diagnosis not present

## 2019-01-25 DIAGNOSIS — G35 Multiple sclerosis: Secondary | ICD-10-CM | POA: Diagnosis not present

## 2019-02-07 DIAGNOSIS — G35 Multiple sclerosis: Secondary | ICD-10-CM | POA: Diagnosis not present

## 2019-02-08 DIAGNOSIS — G35 Multiple sclerosis: Secondary | ICD-10-CM | POA: Diagnosis not present

## 2019-03-21 DIAGNOSIS — G35 Multiple sclerosis: Secondary | ICD-10-CM | POA: Diagnosis not present

## 2019-04-24 DIAGNOSIS — G35 Multiple sclerosis: Secondary | ICD-10-CM | POA: Diagnosis not present

## 2019-05-18 DIAGNOSIS — G35 Multiple sclerosis: Secondary | ICD-10-CM | POA: Diagnosis not present

## 2019-05-23 DIAGNOSIS — G35 Multiple sclerosis: Secondary | ICD-10-CM | POA: Diagnosis not present

## 2019-05-30 DIAGNOSIS — G35 Multiple sclerosis: Secondary | ICD-10-CM | POA: Diagnosis not present

## 2019-06-05 DIAGNOSIS — G35 Multiple sclerosis: Secondary | ICD-10-CM | POA: Diagnosis not present

## 2019-06-05 DIAGNOSIS — Z79899 Other long term (current) drug therapy: Secondary | ICD-10-CM | POA: Diagnosis not present

## 2019-06-20 DIAGNOSIS — G35 Multiple sclerosis: Secondary | ICD-10-CM | POA: Diagnosis not present

## 2019-06-20 DIAGNOSIS — L603 Nail dystrophy: Secondary | ICD-10-CM | POA: Diagnosis not present

## 2019-06-23 DIAGNOSIS — G35 Multiple sclerosis: Secondary | ICD-10-CM | POA: Diagnosis not present

## 2019-07-03 DIAGNOSIS — G35 Multiple sclerosis: Secondary | ICD-10-CM | POA: Diagnosis not present

## 2019-07-19 DIAGNOSIS — G35 Multiple sclerosis: Secondary | ICD-10-CM | POA: Diagnosis not present

## 2019-07-28 ENCOUNTER — Other Ambulatory Visit: Payer: Self-pay

## 2019-07-28 DIAGNOSIS — R6889 Other general symptoms and signs: Secondary | ICD-10-CM | POA: Diagnosis not present

## 2019-07-28 DIAGNOSIS — Z20822 Contact with and (suspected) exposure to covid-19: Secondary | ICD-10-CM

## 2019-07-30 LAB — NOVEL CORONAVIRUS, NAA: SARS-CoV-2, NAA: NOT DETECTED

## 2019-08-01 DIAGNOSIS — G35 Multiple sclerosis: Secondary | ICD-10-CM | POA: Diagnosis not present

## 2019-08-16 DIAGNOSIS — G35 Multiple sclerosis: Secondary | ICD-10-CM | POA: Diagnosis not present

## 2019-08-21 DIAGNOSIS — G35 Multiple sclerosis: Secondary | ICD-10-CM | POA: Diagnosis not present

## 2019-08-30 DIAGNOSIS — G35 Multiple sclerosis: Secondary | ICD-10-CM | POA: Diagnosis not present

## 2019-09-12 DIAGNOSIS — G35 Multiple sclerosis: Secondary | ICD-10-CM | POA: Diagnosis not present

## 2019-09-12 IMAGING — CT CT HEAD W/O CM
4 series · 16 of 47 positions shown, 18 images · non-contrast
Comparison: 08/24/2008

CLINICAL DATA: Patient fell out of wheelchair today striking right
side of face. Laceration near right eyebrow. History of multiple
sclerosis.

EXAM:
CT HEAD WITHOUT CONTRAST
TECHNIQUE: Contiguous axial images were obtained from the base of the skull
through the vertex without intravenous contrast.

[Series 3: head wo · axial · 0.42mm/px · z∈[-164,-54]mm · 6 of 32 slices shown, 8 images]
[im 5/32  brain]
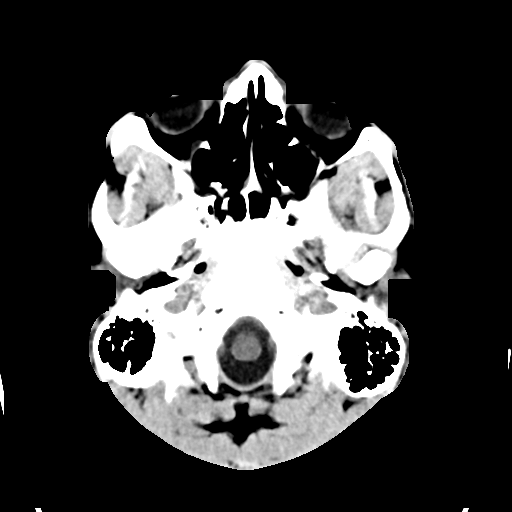
[im 5/32  bone]
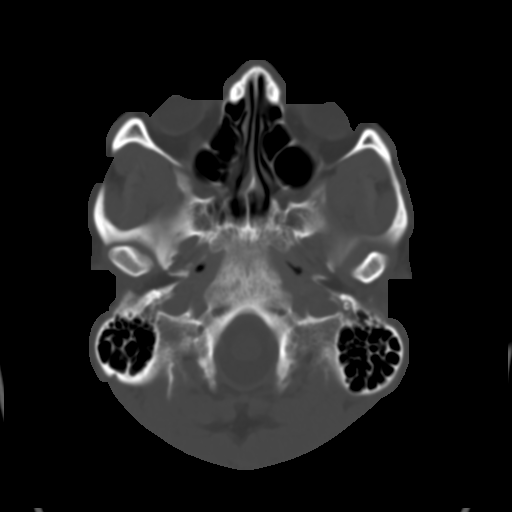
[im 9/32  brain]
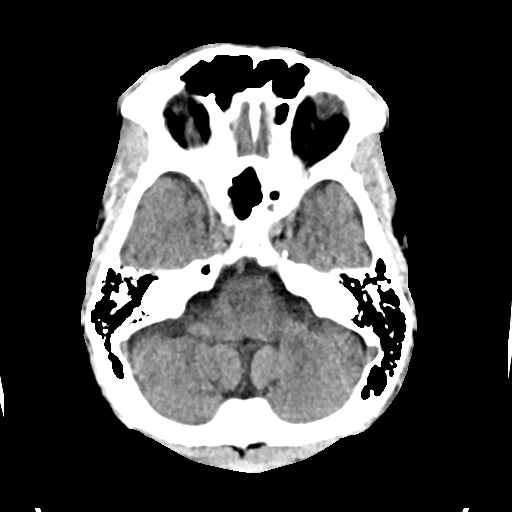
[im 14/32  brain]
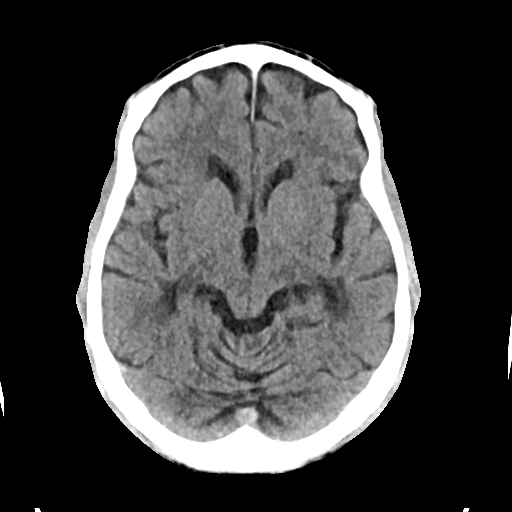
[im 18/32  brain]
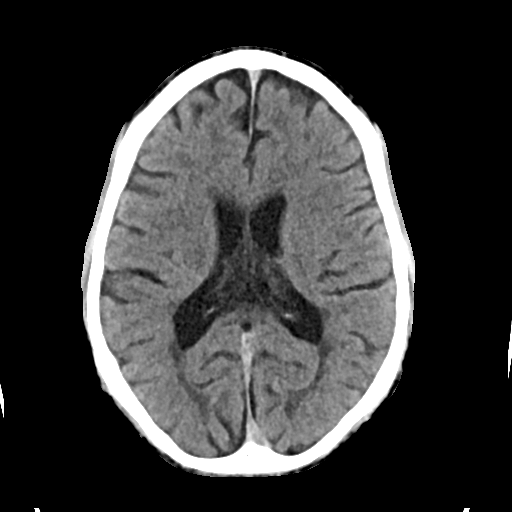
[im 23/32  brain]
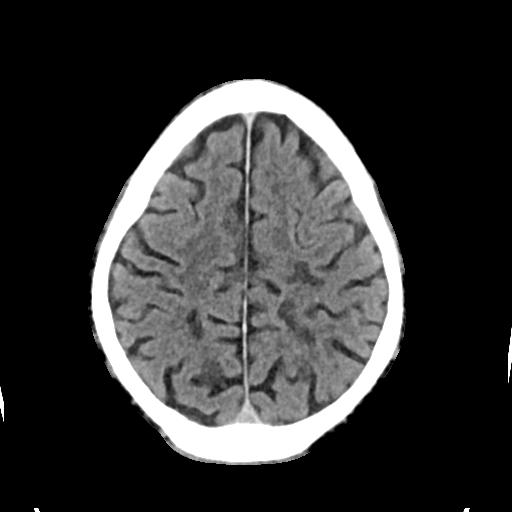
[im 23/32  bone]
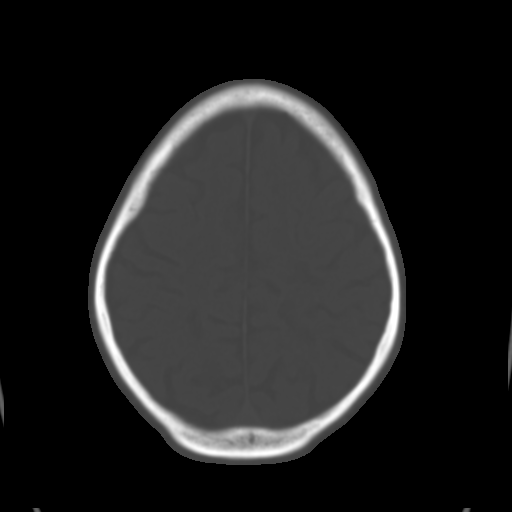
[im 27/32  brain]
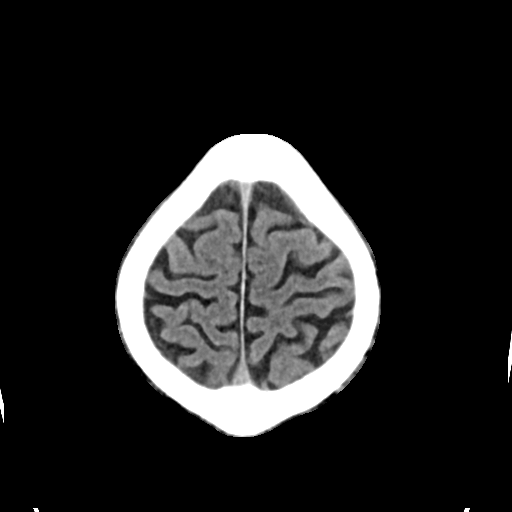

[Series 4: head bone · axial · 0.42mm/px · z∈[-168,-112]mm · 4 of 86 slices shown]
[im 9/86  bone]
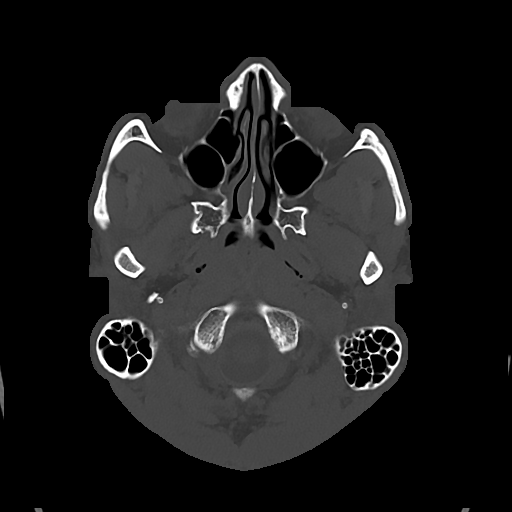
[im 17/86  bone]
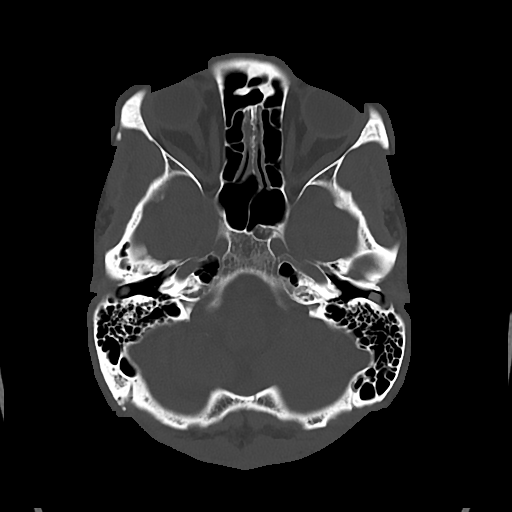
[im 29/86  bone]
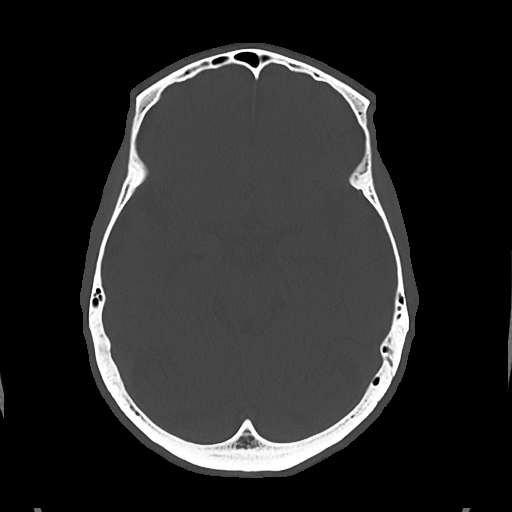
[im 37/86  bone]
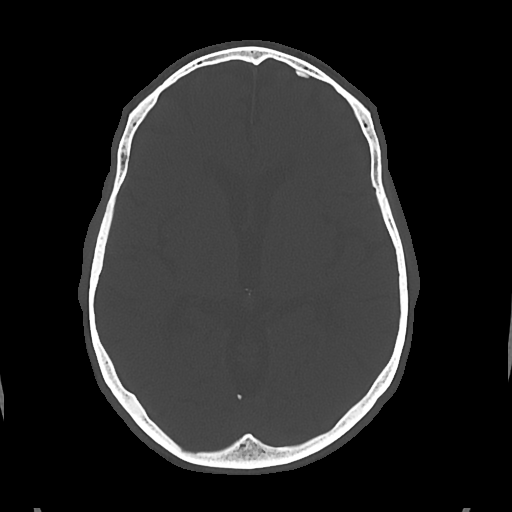

[Series 5: cor soft · coronal · 0.33mm/px · 3 of 70 slices shown]
[im 24/70  brain]
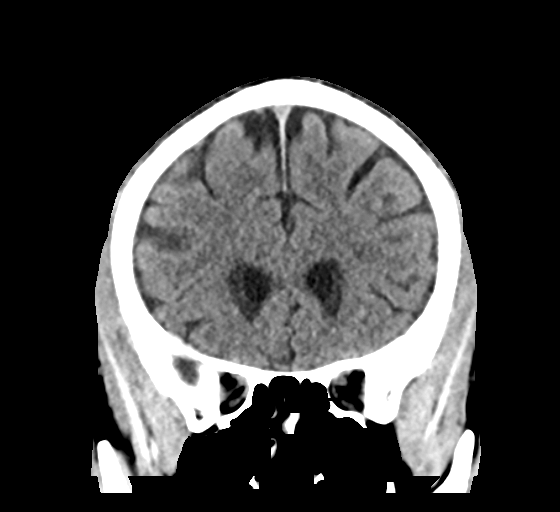
[im 31/70  brain]
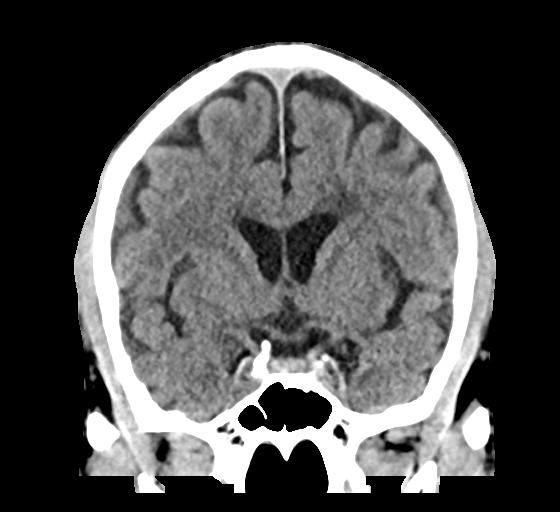
[im 39/70  brain]
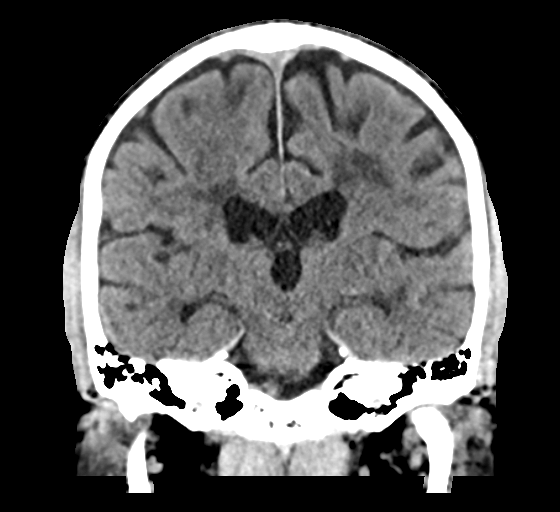

[Series 6: sag soft · sagittal · 0.33mm/px · 3 of 62 slices shown]
[im 21/62  brain]
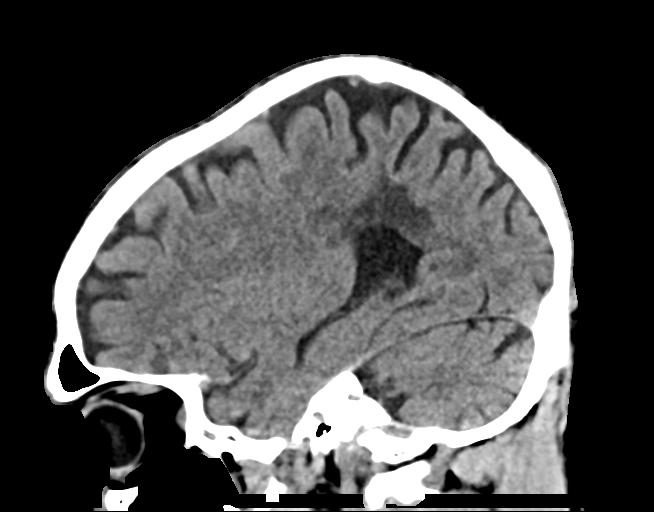
[im 31/62  brain]
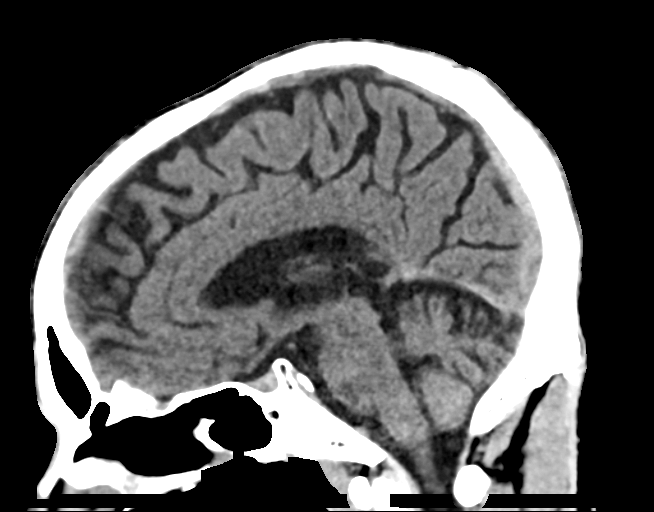
[im 41/62  brain]
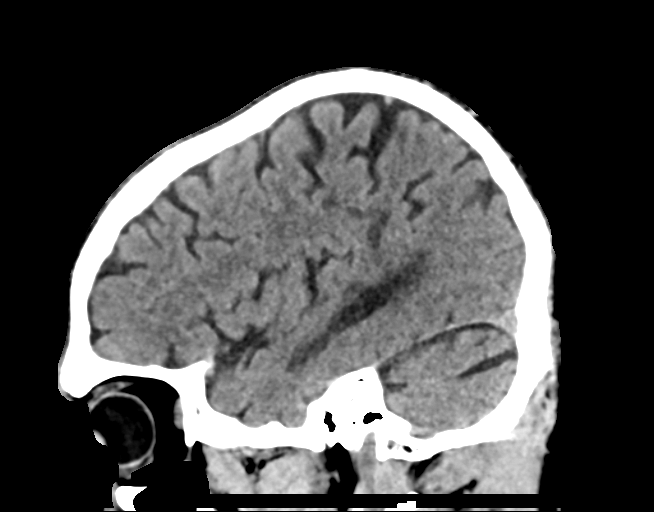

[16 of 47 positions shown; findings below may reference images not displayed]

FINDINGS: Brain: Periventricular white matter hypodensities extending into the
centrum semiovale are more prominent in appearance than on prior and
may reflect the patient's underlying history of multiple sclerosis.
No acute intracranial hemorrhage, intra-axial mass nor extra-axial
fluid collections. No hydrocephalus. No large vascular territory
infarct.

Vascular: No hyperdense vessel or unexpected calcification.

Skull: Negative for fracture or focal lesion.

Sinuses/Orbits: No acute finding.

Other: Right periorbital soft tissue laceration with swelling.
IMPRESSION: 1. Right periorbital contusion with small laceration. No underlying
skull fracture.
2. Periventricular white matter hypodensities extending into the
centrum semiovale bilaterally consistent with underlying changes of
the patient's known multiple sclerosis.
3. No acute intracranial hemorrhage, edema or midline shift. No
extra-axial fluid collections.

## 2019-09-13 DIAGNOSIS — G35 Multiple sclerosis: Secondary | ICD-10-CM | POA: Diagnosis not present

## 2019-09-13 DIAGNOSIS — Z79899 Other long term (current) drug therapy: Secondary | ICD-10-CM | POA: Diagnosis not present

## 2019-09-28 DIAGNOSIS — G35 Multiple sclerosis: Secondary | ICD-10-CM | POA: Diagnosis not present

## 2019-10-04 DIAGNOSIS — G35 Multiple sclerosis: Secondary | ICD-10-CM | POA: Diagnosis not present

## 2019-10-23 DIAGNOSIS — G35 Multiple sclerosis: Secondary | ICD-10-CM | POA: Diagnosis not present

## 2019-10-30 DIAGNOSIS — G35 Multiple sclerosis: Secondary | ICD-10-CM | POA: Diagnosis not present

## 2019-11-21 DIAGNOSIS — G35 Multiple sclerosis: Secondary | ICD-10-CM | POA: Diagnosis not present

## 2019-11-30 DIAGNOSIS — G35 Multiple sclerosis: Secondary | ICD-10-CM | POA: Diagnosis not present

## 2019-12-13 DIAGNOSIS — R270 Ataxia, unspecified: Secondary | ICD-10-CM | POA: Diagnosis not present

## 2019-12-13 DIAGNOSIS — R3915 Urgency of urination: Secondary | ICD-10-CM | POA: Diagnosis not present

## 2019-12-13 DIAGNOSIS — Z79899 Other long term (current) drug therapy: Secondary | ICD-10-CM | POA: Diagnosis not present

## 2019-12-13 DIAGNOSIS — R251 Tremor, unspecified: Secondary | ICD-10-CM | POA: Diagnosis not present

## 2019-12-13 DIAGNOSIS — G35 Multiple sclerosis: Secondary | ICD-10-CM | POA: Diagnosis not present

## 2020-01-01 DIAGNOSIS — G35 Multiple sclerosis: Secondary | ICD-10-CM | POA: Diagnosis not present

## 2020-01-02 DIAGNOSIS — G35 Multiple sclerosis: Secondary | ICD-10-CM | POA: Diagnosis not present

## 2020-01-08 DIAGNOSIS — G35 Multiple sclerosis: Secondary | ICD-10-CM | POA: Diagnosis not present

## 2020-01-30 DIAGNOSIS — G35 Multiple sclerosis: Secondary | ICD-10-CM | POA: Diagnosis not present

## 2020-02-28 DIAGNOSIS — G35 Multiple sclerosis: Secondary | ICD-10-CM | POA: Diagnosis not present

## 2020-03-04 DIAGNOSIS — G35 Multiple sclerosis: Secondary | ICD-10-CM | POA: Diagnosis not present

## 2020-04-10 DIAGNOSIS — G35 Multiple sclerosis: Secondary | ICD-10-CM | POA: Diagnosis not present

## 2020-05-08 DIAGNOSIS — G35 Multiple sclerosis: Secondary | ICD-10-CM | POA: Diagnosis not present

## 2020-05-20 DIAGNOSIS — G35 Multiple sclerosis: Secondary | ICD-10-CM | POA: Diagnosis not present

## 2020-05-23 ENCOUNTER — Other Ambulatory Visit: Payer: Self-pay

## 2020-05-23 ENCOUNTER — Encounter (HOSPITAL_COMMUNITY): Payer: Self-pay | Admitting: Emergency Medicine

## 2020-05-23 ENCOUNTER — Emergency Department (HOSPITAL_COMMUNITY)
Admission: EM | Admit: 2020-05-23 | Discharge: 2020-05-23 | Disposition: A | Discharge status: Home / Self Care | Payer: Medicare Other | Attending: Emergency Medicine | Admitting: Emergency Medicine

## 2020-05-23 DIAGNOSIS — Z743 Need for continuous supervision: Secondary | ICD-10-CM | POA: Diagnosis not present

## 2020-05-23 DIAGNOSIS — Y929 Unspecified place or not applicable: Secondary | ICD-10-CM | POA: Insufficient documentation

## 2020-05-23 DIAGNOSIS — R52 Pain, unspecified: Secondary | ICD-10-CM | POA: Diagnosis not present

## 2020-05-23 DIAGNOSIS — Y939 Activity, unspecified: Secondary | ICD-10-CM | POA: Diagnosis not present

## 2020-05-23 DIAGNOSIS — R58 Hemorrhage, not elsewhere classified: Secondary | ICD-10-CM | POA: Diagnosis not present

## 2020-05-23 DIAGNOSIS — Y999 Unspecified external cause status: Secondary | ICD-10-CM | POA: Insufficient documentation

## 2020-05-23 DIAGNOSIS — Z5321 Procedure and treatment not carried out due to patient leaving prior to being seen by health care provider: Secondary | ICD-10-CM | POA: Diagnosis not present

## 2020-05-23 DIAGNOSIS — R531 Weakness: Secondary | ICD-10-CM | POA: Diagnosis not present

## 2020-05-23 DIAGNOSIS — S0181XA Laceration without foreign body of other part of head, initial encounter: Secondary | ICD-10-CM | POA: Insufficient documentation

## 2020-05-23 NOTE — ED Triage Notes (Addendum)
Pt arrives via [facility], pt was in motorized wheelchair when he fell out of it, lac above L eye and c/o of L leg pain, denies LOC. Hx of MS. A/ox4 with slurred speech at baseline. No blood thinners.

## 2020-05-23 NOTE — ED Notes (Signed)
Pt is leaving post triage with visitor.  Will not return

## 2020-05-25 ENCOUNTER — Other Ambulatory Visit: Payer: Self-pay

## 2020-05-25 ENCOUNTER — Emergency Department (HOSPITAL_COMMUNITY): Payer: Medicare Other

## 2020-05-25 ENCOUNTER — Emergency Department (HOSPITAL_COMMUNITY)
Admission: EM | Admit: 2020-05-25 | Discharge: 2020-05-25 | Discharge status: Against Medical Advice | Payer: Medicare Other | Attending: Emergency Medicine | Admitting: Emergency Medicine

## 2020-05-25 ENCOUNTER — Encounter (HOSPITAL_COMMUNITY): Payer: Self-pay | Admitting: Emergency Medicine

## 2020-05-25 DIAGNOSIS — M7989 Other specified soft tissue disorders: Secondary | ICD-10-CM | POA: Diagnosis not present

## 2020-05-25 DIAGNOSIS — G4489 Other headache syndrome: Secondary | ICD-10-CM | POA: Diagnosis not present

## 2020-05-25 DIAGNOSIS — Z743 Need for continuous supervision: Secondary | ICD-10-CM | POA: Diagnosis not present

## 2020-05-25 DIAGNOSIS — R0789 Other chest pain: Secondary | ICD-10-CM | POA: Diagnosis not present

## 2020-05-25 DIAGNOSIS — Y999 Unspecified external cause status: Secondary | ICD-10-CM | POA: Diagnosis not present

## 2020-05-25 DIAGNOSIS — Z79899 Other long term (current) drug therapy: Secondary | ICD-10-CM | POA: Insufficient documentation

## 2020-05-25 DIAGNOSIS — F17210 Nicotine dependence, cigarettes, uncomplicated: Secondary | ICD-10-CM | POA: Diagnosis not present

## 2020-05-25 DIAGNOSIS — G35 Multiple sclerosis: Secondary | ICD-10-CM | POA: Diagnosis not present

## 2020-05-25 DIAGNOSIS — Y929 Unspecified place or not applicable: Secondary | ICD-10-CM | POA: Diagnosis not present

## 2020-05-25 DIAGNOSIS — S99922A Unspecified injury of left foot, initial encounter: Secondary | ICD-10-CM | POA: Diagnosis present

## 2020-05-25 DIAGNOSIS — Y939 Activity, unspecified: Secondary | ICD-10-CM | POA: Diagnosis not present

## 2020-05-25 DIAGNOSIS — S92252A Displaced fracture of navicular [scaphoid] of left foot, initial encounter for closed fracture: Secondary | ICD-10-CM

## 2020-05-25 DIAGNOSIS — M79672 Pain in left foot: Secondary | ICD-10-CM | POA: Diagnosis not present

## 2020-05-25 DIAGNOSIS — L03116 Cellulitis of left lower limb: Secondary | ICD-10-CM | POA: Insufficient documentation

## 2020-05-25 DIAGNOSIS — R1084 Generalized abdominal pain: Secondary | ICD-10-CM | POA: Diagnosis not present

## 2020-05-25 DIAGNOSIS — R079 Chest pain, unspecified: Secondary | ICD-10-CM | POA: Diagnosis not present

## 2020-05-25 MED ORDER — CEPHALEXIN 500 MG PO CAPS: 500.0000 mg | ORAL_CAPSULE | Freq: Two times a day (BID) | ORAL | 0 refills | Status: AC

## 2020-05-25 MED ORDER — CEPHALEXIN 250 MG PO CAPS
500.0000 mg | ORAL_CAPSULE | Freq: Once | ORAL | Status: AC
  Administered 2020-05-25: 500 mg via ORAL
  Filled 2020-05-25: qty 2

## 2020-05-25 NOTE — Progress Notes (Signed)
Orthopedic Tech Progress Note Patient Details:  Nathan Marsh 1976/11/14 [id number]  Ortho Devices Type of Ortho Device: CAM walker Ortho Device/Splint Location: LLE Ortho Device/Splint Interventions: Ordered, Application   Post Interventions Patient Tolerated: Well Instructions Provided: Adjustment of device, Care of device   [name] 05/25/2020, 7:50 PM
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
## 2020-05-25 NOTE — ED Notes (Signed)
Patient verbalizes understanding of discharge instructions. Opportunity for questioning and answers were provided. Armband removed by staff, pt discharged from ED. Pt. ambulatory and discharged home.  

## 2020-05-25 NOTE — ED Notes (Signed)
Doppler of lt foot by PA .

## 2020-05-25 NOTE — Discharge Instructions (Signed)
Please take the prescribed antibiotic twice a day until finished.  Return to the ER if you have worsening redness, swelling, drainage, pain to the left foot.  Please wear the cam walker, but check on your wound occasionally.  Please follow-up with the orthopedic doctor Dr. Lajoyce Corners, call the phone number provided tomorrow and schedule an appointment for follow-up.  You may take over-the-counter anti-inflammatories for pain.
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
## 2020-05-28 DIAGNOSIS — R634 Abnormal weight loss: Secondary | ICD-10-CM | POA: Diagnosis not present

## 2020-05-30 ENCOUNTER — Other Ambulatory Visit: Payer: Self-pay

## 2020-05-30 ENCOUNTER — Encounter: Payer: Self-pay | Admitting: Orthopedic Surgery

## 2020-05-30 ENCOUNTER — Ambulatory Visit (INDEPENDENT_AMBULATORY_CARE_PROVIDER_SITE_OTHER): Payer: Medicare Other | Admitting: Orthopedic Surgery

## 2020-05-30 VITALS — Ht 65.0 in | Wt 134.0 lb

## 2020-05-30 DIAGNOSIS — M79672 Pain in left foot: Secondary | ICD-10-CM | POA: Diagnosis not present

## 2020-05-31 ENCOUNTER — Encounter: Payer: Self-pay | Admitting: Physician Assistant

## 2020-05-31 ENCOUNTER — Ambulatory Visit (INDEPENDENT_AMBULATORY_CARE_PROVIDER_SITE_OTHER): Payer: Medicare Other | Admitting: Physician Assistant

## 2020-05-31 VITALS — BP 120/90 | HR 57 | Temp 98.2°F

## 2020-05-31 DIAGNOSIS — Z720 Tobacco use: Secondary | ICD-10-CM

## 2020-05-31 DIAGNOSIS — G35 Multiple sclerosis: Secondary | ICD-10-CM

## 2020-05-31 DIAGNOSIS — L03116 Cellulitis of left lower limb: Secondary | ICD-10-CM

## 2020-05-31 DIAGNOSIS — Z136 Encounter for screening for cardiovascular disorders: Secondary | ICD-10-CM

## 2020-05-31 DIAGNOSIS — Z13220 Encounter for screening for lipoid disorders: Secondary | ICD-10-CM

## 2020-05-31 LAB — COMPREHENSIVE METABOLIC PANEL
ALT: 11 U/L (ref 0–53)
AST: 14 U/L (ref 0–37)
Albumin: 4.5 g/dL (ref 3.5–5.2)
Alkaline Phosphatase: 74 U/L (ref 39–117)
BUN: 17 mg/dL (ref 6–23)
CO2: 32 mEq/L (ref 19–32)
Calcium: 9.5 mg/dL (ref 8.4–10.5)
Chloride: 103 mEq/L (ref 96–112)
Creatinine, Ser: 1 mg/dL (ref 0.40–1.50)
GFR: 97.96 mL/min (ref >60.00)
Glucose, Bld: 86 mg/dL (ref 70–99)
Potassium: 4 mEq/L (ref 3.5–5.1)
Sodium: 141 mEq/L (ref 135–145)
Total Bilirubin: 0.4 mg/dL (ref 0.2–1.2)
Total Protein: 6.7 g/dL (ref 6.0–8.3)

## 2020-05-31 LAB — LIPID PANEL
Cholesterol: 138 mg/dL (ref 0–200)
HDL: 46.6 mg/dL (ref >39.00)
LDL Cholesterol: 71 mg/dL (ref 0–99)
NonHDL: 91.36
Total CHOL/HDL Ratio: 3
Triglycerides: 101 mg/dL (ref 0.0–149.0)
VLDL: 20.2 mg/dL (ref 0.0–40.0)

## 2020-05-31 NOTE — Patient Instructions (Signed)
It was great to see you!  I will be in touch with your cholesterol results.  I will put in referral for physical therapy.  Please let us know if there is anything else that you need.  Please talk to your neurologist about the COVID vaccine.  Let's follow-up in 6 months, sooner if you have concerns.  Take care,  Jarold Motto PA-C
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
## 2020-06-03 ENCOUNTER — Encounter: Payer: Self-pay | Admitting: Orthopedic Surgery
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
## 2020-06-04 DIAGNOSIS — G35 Multiple sclerosis: Secondary | ICD-10-CM | POA: Diagnosis not present

## 2020-06-12 ENCOUNTER — Telehealth: Payer: Self-pay | Admitting: Physician Assistant

## 2020-06-12 NOTE — Telephone Encounter (Signed)
Error

## 2020-06-17 ENCOUNTER — Telehealth: Payer: Self-pay | Admitting: Physician Assistant

## 2020-06-17 NOTE — Telephone Encounter (Signed)
Patient is calling in asking If we received a medical clearance form over on Friday, verified our fax number and patient states he will fax it again.

## 2020-06-18 NOTE — Telephone Encounter (Signed)
Tried to contact pt again number not working home or mobile. Form was faxed.

## 2020-06-18 NOTE — Telephone Encounter (Signed)
Tried to contact pt call would not go through. Will try again later. Medical Clearance for Dental Treatment faxed.

## 2020-06-19 ENCOUNTER — Telehealth: Payer: Self-pay | Admitting: Physician Assistant

## 2020-06-19 NOTE — Telephone Encounter (Signed)
I recommend that she reach out to her brother and encourage him to be proactive about his health. If she has specific concerns, she should tell him and let him know that he should see [place] or neurology.  I do not think she his on his DPR(???) so I cannot recommend anything further.
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
## 2020-06-19 NOTE — Telephone Encounter (Signed)
Please advise 

## 2020-06-21 NOTE — Telephone Encounter (Signed)
Spoke with patient's sister in regards to her brother. She states that he needs assistance but she does not have any major concerns at the moment. She was notified to seek care from his neurologist or to make an appointment with [place].

## 2020-06-27 DIAGNOSIS — R634 Abnormal weight loss: Secondary | ICD-10-CM | POA: Diagnosis not present

## 2020-07-10 ENCOUNTER — Telehealth: Payer: Self-pay

## 2020-07-10 NOTE — Telephone Encounter (Signed)
error 

## 2020-07-15 DIAGNOSIS — G35 Multiple sclerosis: Secondary | ICD-10-CM | POA: Diagnosis not present

## 2020-07-22 DIAGNOSIS — G35 Multiple sclerosis: Secondary | ICD-10-CM | POA: Diagnosis not present

## 2020-07-29 ENCOUNTER — Telehealth: Payer: Self-pay | Admitting: Physician Assistant

## 2020-07-29 NOTE — Telephone Encounter (Signed)
Left message for patient to call back and schedule Medicare Annual Wellness Visit (AWV) either virtually/audio only OR in office. Whatever the patients preference is.  No hx; please schedule at anytime with [facility]-Nurse Health Advisor at [place].  This should be a 45 minute visit.   

## 2020-07-30 DIAGNOSIS — G35 Multiple sclerosis: Secondary | ICD-10-CM | POA: Diagnosis not present

## 2020-08-21 DIAGNOSIS — G35 Multiple sclerosis: Secondary | ICD-10-CM | POA: Diagnosis not present

## 2020-08-26 ENCOUNTER — Other Ambulatory Visit: Payer: Self-pay

## 2020-08-26 ENCOUNTER — Ambulatory Visit (INDEPENDENT_AMBULATORY_CARE_PROVIDER_SITE_OTHER): Payer: Medicare Other | Admitting: Physician Assistant

## 2020-08-26 ENCOUNTER — Encounter: Payer: Self-pay | Admitting: Physician Assistant

## 2020-08-26 VITALS — BP 130/80 | HR 59 | Temp 98.7°F

## 2020-08-26 DIAGNOSIS — F4323 Adjustment disorder with mixed anxiety and depressed mood: Secondary | ICD-10-CM | POA: Diagnosis not present

## 2020-08-26 DIAGNOSIS — M7989 Other specified soft tissue disorders: Secondary | ICD-10-CM | POA: Diagnosis not present

## 2020-08-26 DIAGNOSIS — L608 Other nail disorders: Secondary | ICD-10-CM | POA: Diagnosis not present

## 2020-08-26 NOTE — Patient Instructions (Addendum)
It was great to see you!  Please use compression stockings daily. If you develop worsening swelling or any pain/redness -- please let me know and we will do an ultrasound to make sure there is nothing going on. Try to keep your feet elevated.  Please work on quitting smoking. Please get your COVID vaccine.  You will be contacted about your referral to the foot doctor and for a mental health advocate.  Take care,  Jarold Motto PA-C

## 2020-08-26 NOTE — Progress Notes (Signed)
Nathan Marsh is a 44 y.o. male here for a new problem.  I acted as a Neurosurgeon for [employer], PA-C Nathan Mull, LPN   History of Present Illness:   Chief Complaint  Patient presents with  . Leg Swelling    HPI   Leg edema Pt c/o bilateral leg swelling. This has been going on for several years. He is not concerned about this, but he has family members that have noticed it and have him worried. He spends all day in his wheelchair and because of this his legs hang down all day and swell by the end of the day. If his legs are elevated, the swelling resolves. Denies prior history of blood clot, chest pain, SOB.  Bilateral toenail deformity Has thickened L great toenail from fungus. Previously lost his R great toenail due to infection and would like this evaluated. Denies: pain, swelling, irritation to toenails.  Situational anxiety and depression Denies SI/HI.  Reports that he has had worsening symptoms with family members not having his best interest in mind. He would like to move.   Past Medical History:  Diagnosis Date  . Multiple sclerosis (HCC)      Social History   Tobacco Use  . Smoking status: Current Every Day Smoker    Packs/day: 0.50    Types: Cigarettes    Start date: 05/31/2000  . Smokeless tobacco: Never Used  Vaping Use  . Vaping Use: Never used  Substance Use Topics  . Alcohol use: Yes    Comment: very limited  . Drug use: Yes    Types: Marijuana    Past Surgical History:  Procedure Laterality Date  . AMPUTATION Left 04/27/2017   Procedure: AMPUTATION DIGIT REVISION LEFT MIDDLE FINGER;  Surgeon: Nathan Neu, MD;  Location: [facility] OR;  Service: Orthopedics;  Laterality: Left;    Family History  Problem Relation Age of Onset  . Cancer Mother        lung    No Known Allergies  Current Medications:   Current Outpatient Medications:  .  HYDROcodone-acetaminophen (NORCO) 7.5-325 MG tablet, Take 1-2 tablets by mouth every 6 (six) hours as needed  for moderate pain., Disp: 30 tablet, Rfl: 0 .  natalizumab (TYSABRI) 300 MG/15ML injection, Inject into the vein every 30 (thirty) days., Disp: , Rfl:  .  oxybutynin (DITROPAN) 5 MG tablet, Take 5 mg by mouth 2 (two) times daily., Disp: , Rfl:    Review of Systems:   ROS  Negative unless otherwise specified per HPI.  Vitals:   Vitals:   08/26/20 0832  BP: 130/80  Pulse: (!) 59  Temp: 98.7 F (37.1 C)  TempSrc: Temporal  SpO2: 98%     There is no height or weight on file to calculate BMI.  Physical Exam:   Physical Exam Vitals and nursing note reviewed.  Constitutional:      General: He is not in acute distress.    Appearance: He is well-developed. He is not ill-appearing or toxic-appearing.  Cardiovascular:     Rate and Rhythm: Normal rate and regular rhythm.     Pulses: Normal pulses.          Dorsalis pedis pulses are 2+ on the right side and 2+ on the left side.       Posterior tibial pulses are 2+ on the right side and 2+ on the left side.     Heart sounds: Normal heart sounds, S1 normal and S2 normal.  Pulmonary:  Effort: Pulmonary effort is normal.     Breath sounds: Normal breath sounds.  Musculoskeletal:     Right lower leg: 1+ Edema present.     Left lower leg: 1+ Edema present.     Comments: No calf tenderness/erythema Bilateral calves symmetrical  Feet:     Comments: L great toenail with thickened appearance R great toenail with broken nail, thickened appearance Skin:    General: Skin is warm and dry.  Neurological:     Mental Status: He is alert.     GCS: GCS eye subscore is 4. GCS verbal subscore is 5. GCS motor subscore is 6.  Psychiatric:        Speech: Speech normal.        Behavior: Behavior normal. Behavior is cooperative.       Assessment and Plan:   Nathan Marsh was seen today for leg swelling.  Diagnoses and all orders for this visit:  Toenail deformity Referral to podiatry for further management. -     Ambulatory referral to  Podiatry  Swelling of lower leg No red flags on exam. Suspect dependent edema. Encouraged elevation and compression stockings. Worsening precautions advised -- discussed that if any unilateral swelling/pain or acute changes to notify us ASAP.  Situational mixed anxiety and depressive disorder Referral placed. -     Ambulatory referral to Psychology  Reviewed expectations re: course of current medical issues. Discussed self-management of symptoms. Outlined signs and symptoms indicating need for more acute intervention. Patient verbalized understanding and all questions were answered. See orders for this visit as documented in the electronic medical record. Patient received an After-Visit Summary.  CMA or LPN served as scribe during this visit. History, Physical, and Plan performed by medical provider. The above documentation has been reviewed and is accurate and complete.   Nathan Motto, PA-C

## 2020-09-17 ENCOUNTER — Other Ambulatory Visit: Payer: Self-pay

## 2020-09-17 ENCOUNTER — Encounter: Payer: Self-pay | Admitting: Podiatry

## 2020-09-17 ENCOUNTER — Ambulatory Visit (INDEPENDENT_AMBULATORY_CARE_PROVIDER_SITE_OTHER): Payer: Medicare Other | Admitting: Podiatry

## 2020-09-17 VITALS — BP 119/84 | HR 58

## 2020-09-17 DIAGNOSIS — L603 Nail dystrophy: Secondary | ICD-10-CM

## 2020-09-17 DIAGNOSIS — G629 Polyneuropathy, unspecified: Secondary | ICD-10-CM | POA: Diagnosis not present

## 2020-09-17 DIAGNOSIS — B351 Tinea unguium: Secondary | ICD-10-CM

## 2020-09-17 NOTE — Progress Notes (Signed)
  Subjective:  Patient ID: Nathan Marsh, male    DOB: 07-Mar-1976,  MRN: [id number]  Chief Complaint  Patient presents with  . Nail Problem    trim,B/ l toes are touching shoes,feet swelling patient seems to think that the swelling may have come from wearing shoes that were too small, denies any pain in feet   44 y.o. male presents with the above complaint. History confirmed with patient.   Objective:  Physical Exam: warm, good capillary refill, no trophic changes or ulcerative lesions, normal DP and PT pulses and diminished sensation to light touch. Nails thickened and dystrophic with transverse ridging. Left Foot: normal exam, no swelling, tenderness, instability; ligaments intact, full range of motion of all ankle/foot joints  Right Foot: normal exam, no swelling, tenderness, instability; ligaments intact, full range of motion of all ankle/foot joints   Assessment:   1. Onychomycosis   2. Nail dystrophy   3. Neuropathy    Plan:  Patient was evaluated and treated and all questions answered.  Onychomycosis -Nails debrided secondary to neuropathy. -Educated on etiology.  Procedure: Nail Debridement Rationale: Patient meets criteria for routine foot care due to neuropathy Type of Debridement: manual, sharp debridement. Instrumentation: Nail nipper, rotary burr. Number of Nails: 10  Return in about 4 months (around 01/17/2021) for At Risk Foot Care - peripheral neuropathy.

## 2020-09-21 DIAGNOSIS — G35 Multiple sclerosis: Secondary | ICD-10-CM | POA: Diagnosis not present

## 2020-09-23 DIAGNOSIS — G35 Multiple sclerosis: Secondary | ICD-10-CM | POA: Diagnosis not present

## 2020-10-01 DIAGNOSIS — G35 Multiple sclerosis: Secondary | ICD-10-CM | POA: Diagnosis not present

## 2020-10-21 DIAGNOSIS — G35 Multiple sclerosis: Secondary | ICD-10-CM | POA: Diagnosis not present

## 2020-10-28 DIAGNOSIS — G35 Multiple sclerosis: Secondary | ICD-10-CM | POA: Diagnosis not present

## 2020-11-27 DIAGNOSIS — G35 Multiple sclerosis: Secondary | ICD-10-CM | POA: Diagnosis not present

## 2020-12-02 ENCOUNTER — Ambulatory Visit: Payer: Medicare Other | Admitting: Physician Assistant

## 2020-12-03 DIAGNOSIS — G35 Multiple sclerosis: Secondary | ICD-10-CM | POA: Diagnosis not present

## 2020-12-12 ENCOUNTER — Other Ambulatory Visit: Payer: Self-pay

## 2020-12-12 ENCOUNTER — Telehealth: Payer: Self-pay

## 2020-12-12 DIAGNOSIS — F22 Delusional disorders: Secondary | ICD-10-CM

## 2020-12-12 NOTE — Telephone Encounter (Signed)
I have placed referral for psychiatry dx. Paranoia. Please contact [name] with [facility] [phone] for appts

## 2020-12-12 NOTE — Telephone Encounter (Signed)
I'm ok with psychiatry referral but I need to know diagnosis. Depression, anxiety, other?  Please make sure patient is not actively suicidal and review suicide precautions with patient.

## 2020-12-12 NOTE — Telephone Encounter (Signed)
Nathan Marsh from [facility] is stating patient is requesting a referral to a Psychiatrist . preferably male who can see him relatively soon

## 2020-12-13 NOTE — Telephone Encounter (Signed)
I am not able to see behavior health referrals and I do not schedule them I believe they are done as they are added by whoever has access

## 2020-12-25 ENCOUNTER — Encounter: Payer: Self-pay | Admitting: Physician Assistant

## 2020-12-25 ENCOUNTER — Ambulatory Visit (INDEPENDENT_AMBULATORY_CARE_PROVIDER_SITE_OTHER): Payer: Medicare Other | Admitting: Physician Assistant

## 2020-12-25 ENCOUNTER — Other Ambulatory Visit: Payer: Self-pay

## 2020-12-25 VITALS — BP 110/80 | HR 63 | Temp 98.7°F | Ht 65.0 in | Wt 140.0 lb

## 2020-12-25 DIAGNOSIS — F4323 Adjustment disorder with mixed anxiety and depressed mood: Secondary | ICD-10-CM | POA: Diagnosis not present

## 2020-12-25 DIAGNOSIS — L989 Disorder of the skin and subcutaneous tissue, unspecified: Secondary | ICD-10-CM

## 2020-12-25 MED ORDER — VALACYCLOVIR HCL 1 G PO TABS: 1000.0000 mg | ORAL_TABLET | Freq: Three times a day (TID) | ORAL | 0 refills | Status: AC

## 2020-12-25 NOTE — Patient Instructions (Signed)
It was great to see you!  Start valtrex for your lesions, I think you have herpes outbreak, possibly shingles.  [phone] --> this is the number to call to get your covid vaccine -- please ask your pharmacy if they do them also  [phone] --> this is the number to call to schedule an appointment with a therapist  Take care,  Jarold Motto PA-C

## 2020-12-25 NOTE — Progress Notes (Signed)
Nathan Marsh is a 44 y.o. male is here for follow up.  I acted as a Neurosurgeon for [employer], PA-C Corky Mull, LPN   History of Present Illness:   Chief Complaint  Patient presents with  . Anxiety  . Depression  . Recurrent Skin Infections    HPI    Anxiety / Depression Pt here for follow up, currently not taking anything. Missed call from Psychologist -- we have reviewed his chart and unfortunately the wrong number is listed. He denies SI/HI. He is hoping to move out of [place] soon, feels very unsupported by his family. Would like to discuss his concerns with a therapist.  Skin lesion Pt c/o lesion on his right inner thigh that he just noticed. No drainage, fever, chills. He denies pain but does admit that he has very limited sensation in b/l legs. Has hx of genital herpes per his report. Denies recent sexual activity, penile pain/discharge.    Health Maintenance Due  Topic Date Due  . Hepatitis C Screening  Never done  . COVID-19 Vaccine (1) Never done  . HIV Screening  Never done  . INFLUENZA VACCINE  07/28/2020  . COLONOSCOPY (Pts 45-86yrs Insurance coverage will need to be confirmed)  Never done    Past Medical History:  Diagnosis Date  . Multiple sclerosis (HCC)      Social History   Tobacco Use  . Smoking status: Current Every Day Smoker    Packs/day: 0.50    Types: Cigarettes    Start date: 05/31/2000  . Smokeless tobacco: Never Used  Vaping Use  . Vaping Use: Never used  Substance Use Topics  . Alcohol use: Yes    Comment: very limited  . Drug use: Yes    Types: Marijuana    Past Surgical History:  Procedure Laterality Date  . AMPUTATION Left 04/27/2017   Procedure: AMPUTATION DIGIT REVISION LEFT MIDDLE FINGER;  Surgeon: Knute Neu, MD;  Location: [facility] OR;  Service: Orthopedics;  Laterality: Left;    Family History  Problem Relation Age of Onset  . Cancer Mother        lung    PMHx, SurgHx, SocialHx, FamHx, Medications, and  Allergies were reviewed in the Visit Navigator and updated as appropriate.   Patient Active Problem List   Diagnosis Date Noted  . Tobacco abuse 05/31/2020  . Finger injury 04/27/2017  . Ataxia 06/21/2013  . Cerebellar tremor 06/21/2013  . Essential tremor 06/21/2013  . Multiple sclerosis (HCC) 10/26/2007    Social History   Tobacco Use  . Smoking status: Current Every Day Smoker    Packs/day: 0.50    Types: Cigarettes    Start date: 05/31/2000  . Smokeless tobacco: Never Used  Vaping Use  . Vaping Use: Never used  Substance Use Topics  . Alcohol use: Yes    Comment: very limited  . Drug use: Yes    Types: Marijuana    Current Medications and Allergies:    Current Outpatient Medications:  .  natalizumab (TYSABRI) 300 MG/15ML injection, Inject into the vein every 30 (thirty) days., Disp: , Rfl:  .  oxybutynin (DITROPAN) 5 MG tablet, Take 1 tablet by mouth 2 (two) times daily., Disp: , Rfl:  .  valACYclovir (VALTREX) 1000 MG tablet, Take 1 tablet (1,000 mg total) by mouth 3 (three) times daily for 7 days., Disp: 21 tablet, Rfl: 0  No Known Allergies  Review of Systems   ROS Negative unless otherwise specified per HPI.  Vitals:   Vitals:   12/25/20 0957  BP: 110/80  Pulse: 63  Temp: 98.7 F (37.1 C)  TempSrc: Temporal  SpO2: 98%  Weight: 140 lb (63.5 kg)  Height: 5\' 5"  (1.651 m)     Body mass index is 23.3 kg/m.   Physical Exam:    Physical Exam Vitals and nursing note reviewed.  Constitutional:      General: He is not in acute distress.    Appearance: He is well-developed. He is not ill-appearing, toxic-appearing or sickly-appearing.  Cardiovascular:     Rate and Rhythm: Normal rate and regular rhythm.     Pulses: Normal pulses.     Heart sounds: Normal heart sounds, S1 normal and S2 normal.     Comments: No LE edema Pulmonary:     Effort: Pulmonary effort is normal.     Breath sounds: Normal breath sounds.  Skin:    General: Skin is warm, dry  and intact.     Comments: Cluster of vesicles in inner R thigh with slightly erythematous base  Neurological:     Mental Status: He is alert.     GCS: GCS eye subscore is 4. GCS verbal subscore is 5. GCS motor subscore is 6.  Psychiatric:        Mood and Affect: Mood and affect normal.        Speech: Speech normal.        Behavior: Behavior normal. Behavior is cooperative.      Assessment and Plan:    Nathan Marsh was seen today for anxiety, depression and recurrent skin infections.  Diagnoses and all orders for this visit:  Situational mixed anxiety and depressive disorder Uncontrolled. I discussed with patient that if they develop any SI, to tell someone immediately and seek medical attention. Referral for psychology placed. Phone number updated. -     Ambulatory referral to Psychology  Skin lesion Suspect herpes outbreak or possible shingles. Will treat with oral valtrex per orders. Follow-up if worsening or new symptoms.  Other orders -     valACYclovir (VALTREX) 1000 MG tablet; Take 1 tablet (1,000 mg total) by mouth 3 (three) times daily for 7 days.  CMA or LPN served as scribe during this visit. History, Physical, and Plan performed by medical provider. The above documentation has been reviewed and is accurate and complete.  Fayrene Fearing, PA-C [place] [place] 12/25/2020  Follow-up: No follow-ups on file.

## 2020-12-30 DIAGNOSIS — G35 Multiple sclerosis: Secondary | ICD-10-CM | POA: Diagnosis not present

## 2021-01-02 DIAGNOSIS — G35 Multiple sclerosis: Secondary | ICD-10-CM | POA: Diagnosis not present

## 2021-01-02 DIAGNOSIS — Z79899 Other long term (current) drug therapy: Secondary | ICD-10-CM | POA: Diagnosis not present

## 2021-01-03 ENCOUNTER — Telehealth: Payer: Self-pay | Admitting: *Deleted

## 2021-01-03 NOTE — Telephone Encounter (Signed)
Tried to contact pt unable to leave message voicemail has not been set up.

## 2021-01-07 NOTE — Telephone Encounter (Signed)
Nathan Marsh is calling in asking for an update on referral.

## 2021-01-08 NOTE — Telephone Encounter (Signed)
I called [name] w/[facility]. She states the pt is actively calling requesting an appointment but she did not receive a referral. I confirmed fax # and faxed referral to her for Psychiatry.

## 2021-01-22 ENCOUNTER — Ambulatory Visit: Payer: Medicare Other | Admitting: Podiatry

## 2021-01-24 NOTE — Telephone Encounter (Signed)
[facility] called back last week and spoke with Danielle.  [facility] was with patient.   Danielle informed [facility] that [facility] has been trying to reach him.  She gave them [facility] number to call to get appointment scheduled.

## 2021-01-27 ENCOUNTER — Ambulatory Visit: Payer: Medicare Other | Admitting: Psychology

## 2021-01-28 DIAGNOSIS — G35 Multiple sclerosis: Secondary | ICD-10-CM | POA: Diagnosis not present

## 2021-01-29 DIAGNOSIS — G35 Multiple sclerosis: Secondary | ICD-10-CM | POA: Diagnosis not present

## 2021-02-03 DIAGNOSIS — G35 Multiple sclerosis: Secondary | ICD-10-CM | POA: Diagnosis not present

## 2021-02-03 DIAGNOSIS — Z79899 Other long term (current) drug therapy: Secondary | ICD-10-CM | POA: Diagnosis not present

## 2021-03-03 DIAGNOSIS — G35 Multiple sclerosis: Secondary | ICD-10-CM | POA: Diagnosis not present

## 2021-03-18 ENCOUNTER — Telehealth: Payer: Self-pay | Admitting: *Deleted

## 2021-03-18 NOTE — Telephone Encounter (Signed)
Tried to contact pt again no answer, voicemail is not set up, unable to leave message. Need to talk to him regarding forms to be filled out for [facility] [place] Application. I am unable to finish due to need to ask pt questions.

## 2021-03-20 ENCOUNTER — Telehealth: Payer: Self-pay | Admitting: Physician Assistant

## 2021-03-20 NOTE — Telephone Encounter (Signed)
Left message for patient to call back and schedule Medicare Annual Wellness Visit (AWV) either virtually or in office. No detailed message left   AWV-I PER PALMET[place] 01/28/11  please schedule at anytime   This should be a 45 minute visit.

## 2021-04-01 DIAGNOSIS — G35 Multiple sclerosis: Secondary | ICD-10-CM | POA: Diagnosis not present

## 2021-04-10 ENCOUNTER — Ambulatory Visit (INDEPENDENT_AMBULATORY_CARE_PROVIDER_SITE_OTHER): Payer: Medicare Other | Admitting: Physician Assistant

## 2021-04-10 ENCOUNTER — Encounter: Payer: Self-pay | Admitting: Orthopedic Surgery

## 2021-04-10 DIAGNOSIS — B351 Tinea unguium: Secondary | ICD-10-CM | POA: Diagnosis not present

## 2021-04-10 DIAGNOSIS — M25572 Pain in left ankle and joints of left foot: Secondary | ICD-10-CM | POA: Diagnosis not present

## 2021-04-10 DIAGNOSIS — M25571 Pain in right ankle and joints of right foot: Secondary | ICD-10-CM

## 2021-04-10 NOTE — Progress Notes (Signed)
   Office Visit Note   Patient: Nathan Marsh           Date of Birth: 09-11-1976           MRN: [id number] Visit Date: 04/10/2021              Requested [place]: [place] [address] [place],  [place] [address] PCP: Jarold Motto, [name]  Chief Complaint  Patient presents with  . Right Foot - Follow-up  . Left Foot - Follow-up      HPI: Is a pleasant 45 year old gentleman who comes in periodically for trimming of his toenails.  He has no other complaints today.  Assessment & Plan: Visit Diagnoses: No diagnosis found.  Plan: Onychomycotic nails were trimmed to stable surfaces x10 patient will follow up in 3 months  Follow-Up Instructions: No follow-ups on file.   Ortho Exam  Patient is alert, oriented, no adenopathy, well-dressed, normal affect, normal respiratory effort. Bilateral feet skin is in excellent condition palpable pulses.  He has onychomycotic nails no open sores or ulcers.  No evidence of any infections  Imaging: No results found. No images are attached to the encounter.  Labs: No results found for: HGBA1C, ESRSEDRATE, CRP, LABURIC, REPTSTATUS, GRAMSTAIN, CULT, LABORGA   Lab Results  Component Value Date   ALBUMIN 4.5 05/31/2020   ALBUMIN 4.3 06/06/2008    No results found for: MG No results found for: VD25OH  No results found for: PREALBUMIN CBC EXTENDED 06/06/2008  WBC 4.1  RBC 6.25(H)  HGB 14.5  HCT 44.7  PLT 124 PLATELET COUNT CONFIRMED BY SMEAR SPECIMEN CHECKED FOR CLOTS(L)  NEUTROABS 1.9  LYMPHSABS 1.6     There is no height or weight on file to calculate BMI.  Orders:  No orders of the defined types were placed in this encounter.  No orders of the defined types were placed in this encounter.    Procedures: No procedures performed  Clinical Data: No additional findings.  ROS:  All other systems negative, except as noted in the HPI. Review of Systems  Objective: Vital Signs: There were no vitals taken for  this visit.  Specialty Comments:  No specialty comments available.  PMFS History: Patient Active Problem List   Diagnosis Date Noted  . Tobacco abuse 05/31/2020  . Finger injury 04/27/2017  . Ataxia 06/21/2013  . Cerebellar tremor 06/21/2013  . Essential tremor 06/21/2013  . Multiple sclerosis (HCC) 10/26/2007   Past Medical History:  Diagnosis Date  . Multiple sclerosis (HCC)     Family History  Problem Relation Age of Onset  . Cancer Mother        lung    Past Surgical History:  Procedure Laterality Date  . AMPUTATION Left 04/27/2017   Procedure: AMPUTATION DIGIT REVISION LEFT MIDDLE FINGER;  Surgeon: Knute Neu, MD;  Location: [facility] OR;  Service: Orthopedics;  Laterality: Left;   Social History   Occupational History  . Not on file  Tobacco Use  . Smoking status: Current Every Day Smoker    Packs/day: 0.50    Types: Cigarettes    Start date: 05/31/2000  . Smokeless tobacco: Never Used  Vaping Use  . Vaping Use: Never used  Substance and Sexual Activity  . Alcohol use: Yes    Comment: very limited  . Drug use: Yes    Types: Marijuana  . Sexual activity: Not Currently

## 2021-04-29 DIAGNOSIS — G35 Multiple sclerosis: Secondary | ICD-10-CM | POA: Diagnosis not present

## 2021-05-06 ENCOUNTER — Ambulatory Visit (INDEPENDENT_AMBULATORY_CARE_PROVIDER_SITE_OTHER): Payer: Medicare Other | Admitting: Family

## 2021-05-06 ENCOUNTER — Other Ambulatory Visit: Payer: Self-pay

## 2021-05-06 ENCOUNTER — Ambulatory Visit: Payer: Medicare Other | Admitting: Physician Assistant

## 2021-05-06 ENCOUNTER — Encounter: Payer: Self-pay | Admitting: Family

## 2021-05-06 VITALS — BP 118/72 | HR 59 | Temp 98.3°F | Ht 65.0 in | Wt 140.0 lb

## 2021-05-06 DIAGNOSIS — S67193S Crushing injury of left middle finger, sequela: Secondary | ICD-10-CM | POA: Diagnosis not present

## 2021-05-06 DIAGNOSIS — G35 Multiple sclerosis: Secondary | ICD-10-CM | POA: Diagnosis not present

## 2021-05-06 DIAGNOSIS — R29898 Other symptoms and signs involving the musculoskeletal system: Secondary | ICD-10-CM

## 2021-05-06 NOTE — Patient Instructions (Signed)
Exercises to do While Sitting  Exercises that you do while sitting (chair exercises) can give you many of the same benefits as full exercise. Benefits include strengthening your heart, burning calories, and keeping muscles and joints healthy. Exercise can also improve your mood and help with depression and anxiety. You may benefit from chair exercises if you are unable to do standing exercises because of:  Diabetic foot pain.  Obesity.  Illness.  Arthritis.  Recovery from surgery or injury.  Breathing problems.  Balance problems.  Another type of disability. Before starting chair exercises, check with your health care provider or a physical therapist to find out how much exercise you can tolerate and which exercises are safe for you. If your health care provider approves:  Start out slowly and build up over time. Aim to work up to about 10-20 minutes for each exercise session.  Make exercise part of your daily routine.  Drink water when you exercise. Do not wait until you are thirsty. Drink every 10-15 minutes.  Stop exercising right away if you have pain, nausea, shortness of breath, or dizziness.  If you are exercising in a wheelchair, make sure to lock the wheels.  Ask your health care provider whether you can do tai chi or yoga. Many positions in these mind-body exercises can be modified to do while seated. Warm-up Before starting other exercises: 1. Sit up as straight as you can. Have your knees bent at 90 degrees, which is the shape of the capital letter "L." Keep your feet flat on the floor. 2. Sit at the front edge of your chair, if you can. 3. Pull in (tighten) the muscles in your abdomen and stretch your spine and neck as straight as you can. Hold this position for a few minutes. 4. Breathe in and out evenly. Try to concentrate on your breathing, and relax your mind. Stretching Exercise A: Arm stretch 1. Hold your arms out straight in front of your body. 2. Bend  your hands at the wrist with your fingers pointing up, as if signaling someone to stop. Notice the slight tension in your forearms as you hold the position. 3. Keeping your arms out and your hands bent, rotate your hands outward as far as you can and hold this stretch. Aim to have your thumbs pointing up and your pinkie fingers pointing down. Slowly repeat arm stretches for one minute as tolerated. Exercise B: Leg stretch 1. If you can move your legs, try to "draw" letters on the floor with the toes of your foot. Write your name with one foot. 2. Write your name with the toes of your other foot. Slowly repeat the movements for one minute as tolerated. Exercise C: Reach for the sky 1. Reach your hands as far over your head as you can to stretch your spine. 2. Move your hands and arms as if you are climbing a rope. Slowly repeat the movements for one minute as tolerated. Range of motion exercises Exercise A: Shoulder roll 1. Let your arms hang loosely at your sides. 2. Lift just your shoulders up toward your ears, then let them relax back down. 3. When your shoulders feel loose, rotate your shoulders in backward and forward circles. Do shoulder rolls slowly for one minute as tolerated. Exercise B: March in place 1. As if you are marching, pump your arms and lift your legs up and down. Lift your knees as high as you can. ? If you are unable to lift your knees,   just pump your arms and move your ankles and feet up and down. March in place for one minute as tolerated. Exercise C: Seated jumping jacks 1. Let your arms hang down straight. 2. Keeping your arms straight, lift them up over your head. Aim to point your fingers to the ceiling. 3. While you lift your arms, straighten your legs and slide your heels along the floor to your sides, as wide as you can. 4. As you bring your arms back down to your sides, slide your legs back together. ? If you are unable to use your legs, just move your  arms. Slowly repeat seated jumping jacks for one minute as tolerated. Strengthening exercises Exercise A: Shoulder squeeze 1. Hold your arms straight out from your body to your sides, with your elbows bent and your fists pointed at the ceiling. 2. Keeping your arms in the bent position, move them forward so your elbows and forearms meet in front of your face. 3. Open your arms back out as wide as you can with your elbows still bent, until you feel your shoulder blades squeezing together. Hold for 5 seconds. Slowly repeat the movements forward and backward for one minute as tolerated. Contact a health care provider if you:  Had to stop exercising due to any of the following: ? Pain. ? Nausea. ? Shortness of breath. ? Dizziness. ? Fatigue.  Have significant pain or soreness after exercising. Get help right away if you have:  Chest pain.  Difficulty breathing. These symptoms may represent a serious problem that is an emergency. Do not wait to see if the symptoms will go away. Get medical help right away. Call your local emergency services (911 in the [place] Do not drive yourself to the hospital. This information is not intended to replace advice given to you by your health care provider. Make sure you discuss any questions you have with your health care provider. Document Revised: 04/11/2020 Document Reviewed: 04/11/2020 Elsevier Patient Education  2021 [employer]  

## 2021-05-06 NOTE — Progress Notes (Signed)
Acute Office Visit  Subjective:    Patient ID: Nathan Marsh, male    DOB: July 06, 1976, 45 y.o.   MRN: [id number]  Chief Complaint  Patient presents with  . Laceration    Pt flipped a table, and the tip of his left middle finger was cut. He says that he does not feel any pain, but would like to know if it the appearance will change?  . Extremity Weakness    Pt is requesting a muscle relaxer for bilateral legs.    HPI Patient is with complaints of the appearance of his left middle finger.  Patient reports living a table about a year ago that clipped the tip of his left middle finger off.  He now has very little nail and hyperpigmentation of the finger.  Denies any pain.  Denies any mobility issues with the finger  Patient has a history of multiple sclerosis.  He is concerned that his legs are becoming more weak.  Reports taken a muscle relaxant in the past.  Is not currently undergoing physical therapy.  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  Past Medical History:  Diagnosis Date  . Multiple sclerosis (HCC)     Past Surgical History:  Procedure Laterality Date  . AMPUTATION Left 04/27/2017   Procedure: AMPUTATION DIGIT REVISION LEFT MIDDLE FINGER;  Surgeon: Knute Neu, MD;  Location: [facility] OR;  Service: Orthopedics;  Laterality: Left;    Family History  Problem Relation Age of Onset  . Cancer Mother        lung    Social History   Socioeconomic History  . Marital status: Single    Spouse name: Not on file  . Number of children: Not on file  . Years of education: Not on file  . Highest education level: Not on file  Occupational History  . Not on file  Tobacco Use  . Smoking status: Current Every Day Smoker    Packs/day: 0.50    Types: Cigarettes    Start date: 05/31/2000  . Smokeless tobacco: Never Used  Vaping Use  . Vaping Use: Never used  Substance and Sexual Activity  . Alcohol use: Yes    Comment: very limited  . Drug use: Yes    Types: Marijuana   . Sexual activity: Not Currently  Other Topics Concern  . Not on file  Social History Narrative   Live alone   Single   No children   Has disability       Social Determinants of Health   Financial Resource Strain: Not on file  Food Insecurity: Not on file  Transportation Needs: Not on file  Physical Activity: Not on file  Stress: Not on file  Social Connections: Not on file  Intimate Partner Violence: Not on file    Outpatient Medications Prior to Visit  Medication Sig Dispense Refill  . natalizumab (TYSABRI) 300 MG/15ML injection Inject into the vein every 30 (thirty) days.    Marland Kitchen oxybutynin (DITROPAN) 5 MG tablet Take 1 tablet by mouth 2 (two) times daily. (Patient not taking: Reported on 05/06/2021)     No facility-administered medications prior to visit.    No Known Allergies  Review of Systems  Constitutional: Negative.   Respiratory: Negative.   Cardiovascular: Positive for leg swelling.  Endocrine: Negative.   Musculoskeletal:       Wheelchair-bound, left middle finger-tip lacerated  Skin: Negative.   Allergic/Immunologic: Negative.   Neurological: Positive for speech difficulty and weakness.  Hematological: Negative.  Psychiatric/Behavioral: Negative.        Objective:    Physical Exam Vitals and nursing note reviewed.  Cardiovascular:     Rate and Rhythm: Normal rate and regular rhythm.     Pulses: Normal pulses.     Heart sounds: Normal heart sounds.  Pulmonary:     Effort: Pulmonary effort is normal.     Breath sounds: Normal breath sounds.  Musculoskeletal:     Cervical back: Normal range of motion and neck supple.     Right lower leg: Edema present.     Left lower leg: Edema present.     Comments: The tip of the left middle finger is well-healed from the laceration.  Hyperpigmented.  But no pain or swelling.  Skin:    General: Skin is warm and dry.  Neurological:     General: No focal deficit present.     Mental Status: He is alert and  oriented to person, place, and time.     Motor: Weakness present.     Comments: Wheelchair  Psychiatric:        Mood and Affect: Mood normal.        Behavior: Behavior normal.     BP 118/72   Pulse (!) 59   Temp 98.3 F (36.8 C) (Temporal)   Ht 5\' 5"  (1.651 m)   Wt 139 lb 15.9 oz (63.5 kg)   SpO2 100%   BMI 23.30 kg/m  Wt Readings from Last 3 Encounters:  05/06/21 139 lb 15.9 oz (63.5 kg)  12/25/20 140 lb (63.5 kg)  05/30/20 134 lb (60.8 kg)    Health Maintenance Due  Topic Date Due  . HIV Screening  Never done  . Hepatitis C Screening  Never done  . COLONOSCOPY (Pts 45-13yrs Insurance coverage will need to be confirmed)  Never done    There are no preventive care reminders to display for this patient.   No results found for: TSH Lab Results  Component Value Date   WBC 4.1 06/06/2008   HGB 14.5 06/06/2008   HCT 44.7 06/06/2008   MCV 71.6 (L) 06/06/2008   PLT (L) 06/06/2008    124 PLATELET COUNT CONFIRMED BY SMEAR SPECIMEN CHECKED FOR CLOTS   Lab Results  Component Value Date   NA 141 05/31/2020   K 4.0 05/31/2020   CO2 32 05/31/2020   GLUCOSE 86 05/31/2020   BUN 17 05/31/2020   CREATININE 1.00 05/31/2020   BILITOT 0.4 05/31/2020   ALKPHOS 74 05/31/2020   AST 14 05/31/2020   ALT 11 05/31/2020   PROT 6.7 05/31/2020   ALBUMIN 4.5 05/31/2020   CALCIUM 9.5 05/31/2020   GFR 97.96 05/31/2020   Lab Results  Component Value Date   CHOL 138 05/31/2020   Lab Results  Component Value Date   HDL 46.60 05/31/2020   Lab Results  Component Value Date   LDLCALC 71 05/31/2020   Lab Results  Component Value Date   TRIG 101.0 05/31/2020   Lab Results  Component Value Date   CHOLHDL 3 05/31/2020   No results found for: HGBA1C     Assessment & Plan:   Problem List Items Addressed This Visit    Multiple sclerosis (HCC)   Relevant Orders   Ambulatory referral to Physical Therapy    Other Visit Diagnoses    Crushing injury of left middle finger,  sequela    -  Primary   Leg weakness, bilateral       Relevant Orders  Ambulatory referral to Physical Therapy     Advised patient that his left middle finger has healed well and it is not likely that it will have a better physical appearance.  Patient verbalized understanding.  He has a history of multiple sclerosis and concerned about weakness in his legs.  He is interested in physical therapy.  Referral placed.  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  Call with any questions or concerns.  No orders of the defined types were placed in this encounter.    Eulis Foster, FNP

## 2021-05-10 ENCOUNTER — Other Ambulatory Visit: Payer: Self-pay

## 2021-05-10 ENCOUNTER — Emergency Department (HOSPITAL_COMMUNITY)
Admission: EM | Admit: 2021-05-10 | Discharge: 2021-05-12 | Disposition: A | Discharge status: Home / Self Care | Payer: Medicare Other | Attending: Emergency Medicine | Admitting: Emergency Medicine

## 2021-05-10 DIAGNOSIS — Z9181 History of falling: Secondary | ICD-10-CM | POA: Insufficient documentation

## 2021-05-10 DIAGNOSIS — U071 COVID-19: Secondary | ICD-10-CM | POA: Insufficient documentation

## 2021-05-10 DIAGNOSIS — Z2839 Other underimmunization status: Secondary | ICD-10-CM | POA: Insufficient documentation

## 2021-05-10 DIAGNOSIS — R9431 Abnormal electrocardiogram [ECG] [EKG]: Secondary | ICD-10-CM | POA: Diagnosis not present

## 2021-05-10 DIAGNOSIS — R3981 Functional urinary incontinence: Secondary | ICD-10-CM | POA: Diagnosis not present

## 2021-05-10 DIAGNOSIS — R350 Frequency of micturition: Secondary | ICD-10-CM | POA: Diagnosis not present

## 2021-05-10 DIAGNOSIS — R531 Weakness: Secondary | ICD-10-CM | POA: Diagnosis not present

## 2021-05-10 DIAGNOSIS — F17210 Nicotine dependence, cigarettes, uncomplicated: Secondary | ICD-10-CM | POA: Diagnosis not present

## 2021-05-10 DIAGNOSIS — R509 Fever, unspecified: Secondary | ICD-10-CM | POA: Diagnosis not present

## 2021-05-10 DIAGNOSIS — G35 Multiple sclerosis: Secondary | ICD-10-CM | POA: Diagnosis not present

## 2021-05-10 DIAGNOSIS — J9811 Atelectasis: Secondary | ICD-10-CM | POA: Diagnosis not present

## 2021-05-10 DIAGNOSIS — R059 Cough, unspecified: Secondary | ICD-10-CM | POA: Diagnosis not present

## 2021-05-10 LAB — BASIC METABOLIC PANEL
Anion gap: 8 (ref 5–15)
BUN: 17 mg/dL (ref 6–20)
CO2: 29 mmol/L (ref 22–32)
Calcium: 8.9 mg/dL (ref 8.9–10.3)
Chloride: 102 mmol/L (ref 98–111)
Creatinine, Ser: 1.24 mg/dL (ref 0.61–1.24)
GFR, Estimated: >60 mL/min (ref >60)
Glucose, Bld: 96 mg/dL (ref 70–99)
Potassium: 3.8 mmol/L (ref 3.5–5.1)
Sodium: 139 mmol/L (ref 135–145)

## 2021-05-10 LAB — CBC
HCT: 40.7 % (ref 39.0–52.0)
Hemoglobin: 12.4 g/dL — ABNORMAL LOW (ref 13.0–17.0)
MCH: 23.5 pg — ABNORMAL LOW (ref 26.0–34.0)
MCHC: 30.5 g/dL (ref 30.0–36.0)
MCV: 77.1 fL — ABNORMAL LOW (ref 80.0–100.0)
Platelets: 120 10*3/uL — ABNORMAL LOW (ref 150–400)
RBC: 5.28 MIL/uL (ref 4.22–5.81)
RDW: 15.6 % — ABNORMAL HIGH (ref 11.5–15.5)
WBC: 6.1 10*3/uL (ref 4.0–10.5)
nRBC: 0.7 % — ABNORMAL HIGH (ref 0.0–0.2)

## 2021-05-10 LAB — SARS CORONAVIRUS 2 (TAT 6-24 HRS): SARS Coronavirus 2: POSITIVE — AB

## 2021-05-10 MED ORDER — ACETAMINOPHEN 325 MG PO TABS
650.0000 mg | ORAL_TABLET | Freq: Once | ORAL | Status: AC | PRN
  Administered 2021-05-10: 650 mg via ORAL
  Filled 2021-05-10: qty 2

## 2021-05-10 NOTE — ED Notes (Signed)
Genesis's sister Arnette Schaumann) called to said he keeps falling down and hitting his head. For more info call her [phone]

## 2021-05-10 NOTE — ED Provider Notes (Signed)
MSE was initiated and I personally evaluated the patient and placed orders (if any) at  11:06 PM on May 10, 2021.  From group home with chief complaint of generalized weakness and urinary incontinence. Noted to be febrile to 100.8 in triage.  Appears nontoxic.  ROS: As listed above  PE: Alert and oriented Answers questions appropriately No respiratory distress Normal HR  Suspicious of UTI  Discussed with patient that their care has been initiated.   They are counseled that they will need to remain in the ED until the completion of their workup, including full H&P and results of any tests.  Risks of leaving the emergency department prior to completion of treatment were discussed. Patient was advised to inform ED staff if they are leaving before their treatment is complete. The patient acknowledged these risks and time was allowed for questions.    The patient appears stable so that the remainder of the MSE may be completed by another provider.    Roxy Horseman, PA-C 05/10/21 2306    Zadie Rhine, MD 05/10/21 2308

## 2021-05-10 NOTE — ED Triage Notes (Signed)
Pt bib [facility] from home. Pt c/o not feeling good, not eating, fever and runny nose. Pt denies any pain or shortness of breath.

## 2021-05-11 ENCOUNTER — Emergency Department (HOSPITAL_COMMUNITY): Payer: Medicare Other

## 2021-05-11 DIAGNOSIS — U071 COVID-19: Secondary | ICD-10-CM | POA: Diagnosis not present

## 2021-05-11 DIAGNOSIS — J9811 Atelectasis: Secondary | ICD-10-CM | POA: Diagnosis not present

## 2021-05-11 DIAGNOSIS — R059 Cough, unspecified: Secondary | ICD-10-CM | POA: Diagnosis not present

## 2021-05-11 LAB — URINALYSIS, ROUTINE W REFLEX MICROSCOPIC
Bacteria, UA: NONE SEEN
Bilirubin Urine: NEGATIVE
Glucose, UA: NEGATIVE mg/dL
Hgb urine dipstick: NEGATIVE
Ketones, ur: 20 mg/dL — AB
Leukocytes,Ua: NEGATIVE
Nitrite: NEGATIVE
Protein, ur: 30 mg/dL — AB
Specific Gravity, Urine: 1.025 (ref 1.005–1.030)
pH: 6 (ref 5.0–8.0)

## 2021-05-11 LAB — CBG MONITORING, ED: Glucose-Capillary: 93 mg/dL (ref 70–99)

## 2021-05-11 MED ORDER — NIRMATRELVIR/RITONAVIR (PAXLOVID)TABLET: ORAL_TABLET | ORAL | 0 refills | Status: DC

## 2021-05-11 MED ORDER — SODIUM CHLORIDE 0.9 % IV SOLN
100.0000 mg | Freq: Every day | INTRAVENOUS | Status: DC
  Administered 2021-05-12: 100 mg via INTRAVENOUS
  Filled 2021-05-11: qty 20

## 2021-05-11 MED ORDER — SODIUM CHLORIDE 0.9 % IV SOLN
200.0000 mg | Freq: Once | INTRAVENOUS | Status: AC
  Administered 2021-05-11: 200 mg via INTRAVENOUS
  Filled 2021-05-11: qty 40

## 2021-05-11 MED ORDER — ACETAMINOPHEN 325 MG PO TABS
650.0000 mg | ORAL_TABLET | Freq: Three times a day (TID) | ORAL | Status: DC | PRN
  Filled 2021-05-11: qty 2

## 2021-05-11 NOTE — ED Notes (Signed)
Per Dr.Horton, family now agrees to administer Remdesivir.

## 2021-05-11 NOTE — ED Provider Notes (Signed)
Spoke to the patient's sister and father in regards to plan for remdesivir dosing while here in the department since paxlovid is not available.  I have answered all of their questions, they agree with to remdesivir while here in the department until he gets placed.   Rozelle Logan, DO 05/11/21 1819

## 2021-05-11 NOTE — ED Notes (Signed)
Breakfast tray ordered. Pending social worker consult for placement. Pt sleeping. No acute distress observed. Continuous cardiac monitoring in place.

## 2021-05-11 NOTE — Evaluation (Signed)
Physical Therapy Evaluation Patient Details Name: Nathan Marsh MRN: [id number] DOB: 27-Nov-1976 Today's Date: 05/11/2021   History of Present Illness  45 yo presented 05/10/21 to the emergency department for evaluation of fever and generalized weakness.  Patient also has had urinary frequency and urinary incontinence.  Concern for possible UTI.  Patient does have a cough but is not short of breath. He has had multiple falls recently.  No injuries noted from falls. +COVID  PMH MS  Clinical Impression   Pt admitted secondary to problem above with deficits below. PTA patient was falling from his wheelchair more frequently (currently has rug burns on his knees from fall yesterday) and is unable to get himself up off the floor. He has an aide for 8 hours per day, but this leaves him home alone for many hours of each day.  Pt currently requires moderate assistance to move supine to sit at EOB and unsafe to atttempt transfer with 1 person assist. Frankly discussed his current deficits (he agrees his extensor tone in his legs is worse than usual and he is more fatigued than usual) and he agreed to SNF for therapies.  Will continue to follow acutely to maximize functional mobility independence and safety.       Follow Up Recommendations SNF;Supervision/Assistance - 24 hour    Equipment Recommendations  None recommended by PT    Recommendations for Other Services OT consult     Precautions / Restrictions Precautions Precautions: Fall Precaution Comments: falls at least 2x/month      Mobility  Bed Mobility Overal bed mobility: Needs Assistance Bed Mobility: Rolling;Sidelying to Sit;Sit to Supine Rolling: Min assist (with rail) Sidelying to sit: Mod assist (with rail)   Sit to supine: Mod assist   General bed mobility comments: roll to right as pt exits his bed at home to his right (has hospital bed and uses rails at home); required total assist to move LEs over EOB and min assist for  raising torso (for overall mod assist to get to sitting EOB); required assist to guide trunk/head away from bed rail as returning to supine and assist to lift legs onto mattress    Transfers                 General transfer comment: unable to safely attempt with +1 assist and only small chair in room  Ambulation/Gait                Stairs            Wheelchair Mobility    Modified Rankin (Stroke Patients Only)       Balance Overall balance assessment: Needs assistance Sitting-balance support: Bilateral upper extremity supported;Feet supported Sitting balance-Leahy Scale: Poor                                       Pertinent Vitals/Pain Pain Assessment: No/denies pain    Home Living Family/patient expects to be discharged to:: Private residence Living Arrangements: Alone Available Help at Discharge: Personal care attendant;Family (aide 8 hrs/day 7 days/week) Type of Home: House Home Access: Ramped entrance     Home Layout: One level Home Equipment: Wheelchair - power;Wheelchair - manual;Bedside commode;Grab bars - tub/shower;Hospital bed [facility] over toilet)      Prior Function Level of Independence: Needs assistance   Gait / Transfers Assistance Needed: uses manual wheelchair for locomotion; fell out of wheelchair and  was reaching for something on the floor (yesterday);  falls about 2x per month;cannot get off the floor and get back into the chair (aide or friend assist him)  ADL's / Homemaking Assistance Needed: usually gets down into tub and CNA helps take bath and helps him get out        Hand Dominance        Extremity/Trunk Assessment   Upper Extremity Assessment Upper Extremity Assessment: Generalized weakness (increased tone and ataxia with reaching to use bed rails)    Lower Extremity Assessment Lower Extremity Assessment: RLE deficits/detail;LLE deficits/detail RLE Deficits / Details: increase extensor tone in supine  (unable to flex knee until in sidelying and sitting--knee flexion 90) RLE Coordination: decreased gross motor LLE Deficits / Details: spastic but pt able to flex in supine; sitting knee flexion to 120 LLE Coordination: decreased gross motor    Cervical / Trunk Assessment Cervical / Trunk Assessment: Other exceptions Cervical / Trunk Exceptions: increased tone  Communication      Cognition Arousal/Alertness: Awake/alert Behavior During Therapy: WFL for tasks assessed/performed Overall Cognitive Status: No family/caregiver present to determine baseline cognitive functioning                                 General Comments: oriented to month, year but not day of week; good attention and awareness of deficits; ?safety awareness with mention of frequent falls      General Comments      Exercises     Assessment/Plan    PT Assessment Patient needs continued PT services  PT Problem List Decreased strength;Decreased range of motion;Decreased activity tolerance;Decreased balance;Decreased mobility;Decreased coordination;Decreased cognition;Decreased knowledge of use of DME;Decreased safety awareness;Impaired tone;Decreased skin integrity       PT Treatment Interventions DME instruction;Functional mobility training;Therapeutic activities;Therapeutic exercise;Balance training;Neuromuscular re-education;Cognitive remediation;Patient/family education;Wheelchair mobility training    PT Goals (Current goals can be found in the Care Plan section)  Acute Rehab PT Goals Patient Stated Goal: agrees with need for residential rehabilitation/therapy for improving mobility/safety PT Goal Formulation: With patient Time For Goal Achievement: 05/25/21 Potential to Achieve Goals: Fair    Frequency Min 2X/week   Barriers to discharge Decreased caregiver support      Co-evaluation               AM-PAC PT "6 Clicks" Mobility  Outcome Measure Help needed turning from your back  to your side while in a flat bed without using bedrails?: A Little Help needed moving from lying on your back to sitting on the side of a flat bed without using bedrails?: A Lot Help needed moving to and from a bed to a chair (including a wheelchair)?: Total Help needed standing up from a chair using your arms (e.g., wheelchair or bedside chair)?: Total Help needed to walk in hospital room?: Total Help needed climbing 3-5 steps with a railing? : Total 6 Click Score: 9    End of Session   Activity Tolerance: Patient limited by fatigue Patient left: in bed;with call bell/phone within reach;with bed alarm set   PT Visit Diagnosis: Other abnormalities of gait and mobility (R26.89);Other symptoms and signs involving the nervous system (S97.026)    Time: 3785-8850 PT Time Calculation (min) (ACUTE ONLY): 32 min   Charges:   PT Evaluation $PT Eval Moderate Complexity: 1 Mod PT Treatments $Therapeutic Activity: 8-22 mins         Jerolyn Center, PT Pager [phone]  Scherrie November Nigil Braman 05/11/2021, 12:54 PM

## 2021-05-11 NOTE — ED Notes (Signed)
Father Zebedee Segundo sr [phone] would like an update and to speak to his son when he has a chance

## 2021-05-11 NOTE — ED Notes (Signed)
Sister and Darcella Cheshire [phone] would like an update asap

## 2021-05-11 NOTE — ED Notes (Signed)
Family notified regarding a fall that happened in pt room. Pt rolled out of bed. Reports he was calling no one answered. Found on the floor by staff. Providers at bedside. Pt was assessed. No wounds or deformities observed. Pt denies any pain. Remains alert and oriented x 3. Spoke to Nathan Marsh who is pt sister. Will continue to monitor.

## 2021-05-11 NOTE — ED Provider Notes (Signed)
  Physical Exam  BP 122/77   Pulse 63   Temp (!) 100.8 F (38.2 C) (Oral)   Resp 17   SpO2 99%   Physical Exam  ED Course/Procedures     Procedures  MDM  Received care from Dr. Blinda Leatherwood. See his note for prior history and physical and care.  Briefly this is a 45 year old unvaccinated male with a history of MS who presents with concern for frequent falls and was found to be COVID-positive.  He does not have hypoxia or indication for admission.  Initially, he is declining placement, however after physical therapy evaluation, he reported he would be interested in placement.  He is currently awaiting placement.  We will give a dose of remdesivir as we do not have paxlovid available in the ED.  Plan would be for him to initiate paxlovid after leaving the ED.  He did have a fall in his room without signs of injuries.  Currently awaiting placement at time of transfer of care.        Alvira Monday, MD 05/13/21 2249

## 2021-05-11 NOTE — ED Provider Notes (Incomplete)
[name] EMERGENCY DEPARTMENT Provider Note   CSN: [id number] Arrival date & time: 05/10/21  1805     History Chief Complaint  Patient presents with  . Fever    Nathan Marsh is a 45 y.o. male.  Patient presents to the emergency department for evaluation of fever and generalized weakness.  Patient also has had urinary frequency and urinary incontinence.  Concern for possible UTI.  Patient does have a cough but is not short of breath.  No nausea, vomiting, diarrhea.        Past Medical History:  Diagnosis Date  . Multiple sclerosis [facility])     Patient Active Problem List   Diagnosis Date Noted  . Tobacco abuse 05/31/2020  . Finger injury 04/27/2017  . Ataxia 06/21/2013  . Cerebellar tremor 06/21/2013  . Essential tremor 06/21/2013  . Multiple sclerosis (HCC) 10/26/2007    Past Surgical History:  Procedure Laterality Date  . AMPUTATION Left 04/27/2017   Procedure: AMPUTATION DIGIT REVISION LEFT MIDDLE FINGER;  Surgeon: Knute Neu, MD;  Location: [facility] OR;  Service: Orthopedics;  Laterality: Left;       Family History  Problem Relation Age of Onset  . Cancer Mother        lung    Social History   Tobacco Use  . Smoking status: Current Every Day Smoker    Packs/day: 0.50    Types: Cigarettes    Start date: 05/31/2000  . Smokeless tobacco: Never Used  Vaping Use  . Vaping Use: Never used  Substance Use Topics  . Alcohol use: Yes    Comment: very limited  . Drug use: Yes    Types: Marijuana    Home Medications Prior to Admission medications   Medication Sig Start Date End Date Taking? Authorizing Provider  natalizumab (TYSABRI) 300 MG/15ML injection Inject into the vein every 30 (thirty) days.    [provider]  oxybutynin (DITROPAN) 5 MG tablet Take 1 tablet by mouth 2 (two) times daily. Patient not taking: Reported on 05/06/2021 09/09/20   [provider]    Allergies    Patient has no known  allergies.  Review of Systems   Review of Systems  Constitutional: Positive for fever.  Respiratory: Positive for cough.   Genitourinary: Positive for frequency.  All other systems reviewed and are negative.   Physical Exam Updated Vital Signs BP 125/90   Pulse 66   Temp 98.9 F (37.2 C) (Oral)   Resp (!) 22   SpO2 100%   Physical Exam Vitals and nursing note reviewed.  Constitutional:      General: He is not in acute distress.    Appearance: Normal appearance. He is well-developed.  HENT:     Head: Normocephalic and atraumatic.     Right Ear: Hearing normal.     Left Ear: Hearing normal.     Nose: Nose normal.  Eyes:     Conjunctiva/sclera: Conjunctivae normal.     Pupils: Pupils are equal, round, and reactive to light.  Cardiovascular:     Rate and Rhythm: Regular rhythm.     Heart sounds: S1 normal and S2 normal. No murmur heard. No friction rub. No gallop.   Pulmonary:     Effort: Pulmonary effort is normal. No respiratory distress.     Breath sounds: Normal breath sounds.  Chest:     Chest wall: No tenderness.  Abdominal:     General: Bowel sounds are normal.     Palpations: Abdomen  is soft.     Tenderness: There is no abdominal tenderness. There is no guarding or rebound. Negative signs include Murphy's sign and McBurney's sign.     Hernia: No hernia is present.  Musculoskeletal:        General: Normal range of motion.     Cervical back: Normal range of motion and neck supple.  Skin:    General: Skin is warm and dry.     Findings: No rash.  Neurological:     Mental Status: He is alert and oriented to person, place, and time.     GCS: GCS eye subscore is 4. GCS verbal subscore is 5. GCS motor subscore is 6.     Cranial Nerves: No cranial nerve deficit.     Sensory: No sensory deficit.     Coordination: Coordination normal.  Psychiatric:        Speech: Speech normal.        Behavior: Behavior normal.        Thought Content: Thought content normal.      ED Results / Procedures / Treatments   Labs (all labs ordered are listed, but only abnormal results are displayed) Labs Reviewed  SARS CORONAVIRUS 2 (TAT 6-24 HRS) - Abnormal; Notable for the following components:      Result Value   SARS Coronavirus 2 POSITIVE (*)    All other components within normal limits  CBC - Abnormal; Notable for the following components:   Hemoglobin 12.4 (*)    MCV 77.1 (*)    MCH 23.5 (*)    RDW 15.6 (*)    Platelets 120 (*)    nRBC 0.7 (*)    All other components within normal limits  BASIC METABOLIC PANEL  URINALYSIS, ROUTINE W REFLEX MICROSCOPIC  CBG MONITORING, ED    EKG None  Radiology No results found.  Procedures Procedures {Remember to document critical care time when appropriate:1}  Medications Ordered in ED Medications  acetaminophen (TYLENOL) tablet 650 mg (650 mg Oral Given 05/10/21 1815)    ED Course  I have reviewed the triage vital signs and the nursing notes.  Pertinent labs & imaging results that were available during my care of the patient were reviewed by me and considered in my medical decision making (see chart for details).    MDM Rules/Calculators/A&P                          *** Final Clinical Impression(s) / ED Diagnoses Final diagnoses:  None    Rx / DC Orders ED Discharge Orders    None

## 2021-05-11 NOTE — ED Provider Notes (Incomplete)
[name] EMERGENCY DEPARTMENT Provider Note   CSN: [id number] Arrival date & time: 05/10/21  1805     History Chief Complaint  Patient presents with  . Fever    Nathan Marsh is a 45 y.o. male.  Patient presents to the emergency department for evaluation of fever and generalized weakness.  Patient also has had urinary frequency and urinary incontinence.  Concern for possible UTI.  Patient does have a cough but is not short of breath.  No nausea, vomiting, diarrhea.        Past Medical History:  Diagnosis Date  . Multiple sclerosis [facility])     Patient Active Problem List   Diagnosis Date Noted  . Tobacco abuse 05/31/2020  . Finger injury 04/27/2017  . Ataxia 06/21/2013  . Cerebellar tremor 06/21/2013  . Essential tremor 06/21/2013  . Multiple sclerosis (HCC) 10/26/2007    Past Surgical History:  Procedure Laterality Date  . AMPUTATION Left 04/27/2017   Procedure: AMPUTATION DIGIT REVISION LEFT MIDDLE FINGER;  Surgeon: Knute Neu, MD;  Location: [facility] OR;  Service: Orthopedics;  Laterality: Left;       Family History  Problem Relation Age of Onset  . Cancer Mother        lung    Social History   Tobacco Use  . Smoking status: Current Every Day Smoker    Packs/day: 0.50    Types: Cigarettes    Start date: 05/31/2000  . Smokeless tobacco: Never Used  Vaping Use  . Vaping Use: Never used  Substance Use Topics  . Alcohol use: Yes    Comment: very limited  . Drug use: Yes    Types: Marijuana    Home Medications Prior to Admission medications   Medication Sig Start Date End Date Taking? Authorizing Provider  natalizumab (TYSABRI) 300 MG/15ML injection Inject into the vein every 30 (thirty) days.    [provider]  oxybutynin (DITROPAN) 5 MG tablet Take 1 tablet by mouth 2 (two) times daily. Patient not taking: Reported on 05/06/2021 09/09/20   [provider]    Allergies    Patient has no known  allergies.  Review of Systems   Review of Systems  Constitutional: Positive for fever.  Respiratory: Positive for cough.   Genitourinary: Positive for frequency.  All other systems reviewed and are negative.   Physical Exam Updated Vital Signs BP 125/90   Pulse 66   Temp 98.9 F (37.2 C) (Oral)   Resp (!) 22   SpO2 100%   Physical Exam Vitals and nursing note reviewed.  Constitutional:      General: He is not in acute distress.    Appearance: Normal appearance. He is well-developed.  HENT:     Head: Normocephalic and atraumatic.     Right Ear: Hearing normal.     Left Ear: Hearing normal.     Nose: Nose normal.  Eyes:     Conjunctiva/sclera: Conjunctivae normal.     Pupils: Pupils are equal, round, and reactive to light.  Cardiovascular:     Rate and Rhythm: Regular rhythm.     Heart sounds: S1 normal and S2 normal. No murmur heard. No friction rub. No gallop.   Pulmonary:     Effort: Pulmonary effort is normal. No respiratory distress.     Breath sounds: Normal breath sounds.  Chest:     Chest wall: No tenderness.  Abdominal:     General: Bowel sounds are normal.     Palpations: Abdomen  is soft.     Tenderness: There is no abdominal tenderness. There is no guarding or rebound. Negative signs include Murphy's sign and McBurney's sign.     Hernia: No hernia is present.  Musculoskeletal:        General: Normal range of motion.     Cervical back: Normal range of motion and neck supple.  Skin:    General: Skin is warm and dry.     Findings: No rash.  Neurological:     Mental Status: He is alert and oriented to person, place, and time.     GCS: GCS eye subscore is 4. GCS verbal subscore is 5. GCS motor subscore is 6.     Cranial Nerves: No cranial nerve deficit.     Sensory: No sensory deficit.     Coordination: Coordination normal.  Psychiatric:        Speech: Speech normal.        Behavior: Behavior normal.        Thought Content: Thought content normal.      ED Results / Procedures / Treatments   Labs (all labs ordered are listed, but only abnormal results are displayed) Labs Reviewed  SARS CORONAVIRUS 2 (TAT 6-24 HRS) - Abnormal; Notable for the following components:      Result Value   SARS Coronavirus 2 POSITIVE (*)    All other components within normal limits  CBC - Abnormal; Notable for the following components:   Hemoglobin 12.4 (*)    MCV 77.1 (*)    MCH 23.5 (*)    RDW 15.6 (*)    Platelets 120 (*)    nRBC 0.7 (*)    All other components within normal limits  BASIC METABOLIC PANEL  URINALYSIS, ROUTINE W REFLEX MICROSCOPIC  CBG MONITORING, ED    EKG None  Radiology No results found.  Procedures Procedures {Remember to document critical care time when appropriate:1}  Medications Ordered in ED Medications  acetaminophen (TYLENOL) tablet 650 mg (650 mg Oral Given 05/10/21 1815)    ED Course  I have reviewed the triage vital signs and the nursing notes.  Pertinent labs & imaging results that were available during my care of the patient were reviewed by me and considered in my medical decision making (see chart for details).    MDM Rules/Calculators/A&P                          Patient presents to the emergency department with fever.  He has had increased weakness from his baseline.  Information provided by his sister.  He lives alone, has previously declined any placement despite the family wishing for him to be placed.  He does have an aide that comes in several hours a day.  Patient has had frequent falls secondary to his MS but seems to have increased falls recently.  This is likely secondary to acute illness.  Patient tested positive for COVID.  He is unvaccinated.  He was mildly febrile at arrival but has defervesced.  Vital signs are otherwise normal.  He is 100% on room air, no difficulty breathing.  He does have cough. Final Clinical Impression(s) / ED Diagnoses Final diagnoses:  None    Rx / DC  Orders ED Discharge Orders    None

## 2021-05-11 NOTE — TOC Initial Note (Addendum)
Transition of Care [facility]) - Initial/Assessment Note    Patient Details  Name: Nathan Marsh MRN: [id number] Date of Birth: [birth year]  Transition of Care [facility]) CM/SW Contact:    Lockie Pares, RN Phone Number: 05/11/2021, 10:05 AM  Clinical Narrative:                 44 year olT consult placed for recommendationsd patinet history of MS lives alone, sister next of kin. Inwith general weakness Uri symptoms urinary incontinence. Positive COVID> Sister states has been falling more frequently at home. He has a aide 3 hours in the morning and 3 hours in the evening. His bathroom is very small and frequently falls in there. He has DME at home cannot ambulate. Has wheelchair, electric and manual, the electric one is needing repair.  They have some caretaking at home for a few hours, but not 24/7.  Patient is stating he is adverse to placement. PT will eval definitly recommend socia work to wfollow patient as he will need to transistion to ALF,SNF at some point. Recommend order for condom cath as well to avoid  Bathroom falls and clothes changing, wet skin.   1430: spoke o sister she will look on Medicare.gov for ratings onNOT Patient agreed to be placed in rehab per PT note. She does NOTt want him going to [facility]. FL2 done PASSR could not be completed. Passed to Monday shift for completion  Expected Discharge Plan: Home w Home Health Services     Patient Goals and CMS Choice        Expected Discharge Plan and Services Expected Discharge Plan: Home w Home Health Services   Discharge Planning Services: CM Consult   Living arrangements for the past 2 months: Apartment                                      Prior Living Arrangements/Services Living arrangements for the past 2 months: Apartment Lives with:: Self Patient language and need for interpreter reviewed:: Yes        Need for Family Participation in Patient Care: Yes (Comment) Care giver support system in  place?: Yes (comment)   Criminal Activity/Legal Involvement Pertinent to Current Situation/Hospitalization: No - Comment as needed  Activities of Daily Living      Permission Sought/Granted                  Emotional Assessment       Orientation: : Oriented to Situation,Oriented to  Time,Oriented to Place,Oriented to Self Alcohol / Substance Use: Not Applicable Psych Involvement: No (comment)  Admission diagnosis:  gen weakness  Patient Active Problem List   Diagnosis Date Noted  . Tobacco abuse 05/31/2020  . Finger injury 04/27/2017  . Ataxia 06/21/2013  . Cerebellar tremor 06/21/2013  . Essential tremor 06/21/2013  . Multiple sclerosis (HCC) 10/26/2007   PCP:  Jarold Motto, PA Pharmacy:   [facility] [phone] - [place], [place] - [address] AT [facility] [name] [place] [address] Phone: [phone] Fax: [phone]     Social Determinants of Health (SDOH) Interventions    Readmission Risk Interventions No flowsheet data found.

## 2021-05-11 NOTE — ED Notes (Signed)
Spoke to pt sister who is not wanting to give Remdesivir to pt until she talks to the doctor. Will notify MD regarding sister's request.

## 2021-05-11 NOTE — NC FL2 (Signed)
  [place] MEDICAID FL2 LEVEL OF CARE SCREENING TOOL     IDENTIFICATION  Patient Name: Nathan Marsh Birthdate: [birth year] Sex: male Admission Date (Current Location): 05/10/2021  [facility] and [place] Number:  Producer, television/film/video and Address:  [facility] [place], [address], [place], [place] [address]      Provider Number: [id number]  Attending Physician Name and Address:  Alvira Monday, MD  Relative Name and Phone Number:  George Hugh [phone]    Current Level of Care: SNF Recommended Level of Care: Skilled Nursing Facility Prior Approval Number:    Date Approved/Denied:   PASRR Number:    Discharge Plan: SNF    Current Diagnoses: Patient Active Problem List   Diagnosis Date Noted  . Tobacco abuse 05/31/2020  . Finger injury 04/27/2017  . Ataxia 06/21/2013  . Cerebellar tremor 06/21/2013  . Essential tremor 06/21/2013  . Multiple sclerosis (HCC) 10/26/2007    Orientation RESPIRATION BLADDER Height & Weight     Self,Time,Situation,Place  Normal Incontinent,External catheter Weight:   Height:     BEHAVIORAL SYMPTOMS/MOOD NEUROLOGICAL BOWEL NUTRITION STATUS      Incontinent (at times) Diet (See discharge Summary)  AMBULATORY STATUS COMMUNICATION OF NEEDS Skin   Extensive Assist (uses Wheelchair)                           Personal Care Assistance Level of Assistance  Total care Bathing Assistance: Maximum assistance Feeding assistance: Maximum assistance   Total Care Assistance: Maximum assistance   Functional Limitations Info             SPECIAL CARE FACTORS FREQUENCY  PT (By licensed PT),OT (By licensed OT)     PT Frequency: 5x week OT Frequency: 5x week            Contractures      Additional Factors Info  Code Status Code Status Info: Full Code             Current Medications (05/11/2021):  This is the current hospital active medication list Current Facility-Administered Medications   Medication Dose Route Frequency Provider Last Rate Last Admin  . acetaminophen (TYLENOL) tablet 650 mg  650 mg Oral Q8H PRN Henderly, Britni A, PA-C       Current Outpatient Medications  Medication Sig Dispense Refill  . nirmatrelvir/ritonavir EUA (PAXLOVID) TABS Patient GFR is >60. Take nirmatrelvir (150 mg) 2 tablet(s) twice daily for 5 days and ritonavir (100 mg) one tablet twice daily for 5 days. 30 tablet 0  . oxybutynin (DITROPAN) 5 MG tablet Take 1 tablet by mouth 2 (two) times daily.       Discharge Medications: Please see discharge summary for a list of discharge medications.  Relevant Imaging Results:  Relevant Lab Results:   Additional Information SSN# [id number]  Lockie Pares, RN

## 2021-05-11 NOTE — ED Provider Notes (Signed)
Nursing called me to patient's room.  Patient had rolled out of bed.  States he had called out and no one had answered.  He was currently medically cleared from previous provider.  He was awaiting SNF placement due to increased weakness, history of MS.  History of multiple falls.  Patient denies hitting his head, LOC.  He denies any complaints aside from the fact he would like some cranberry juice.  He does feel warm on exam, recent diagnosis of COVID infection.  No shortening or rotation of legs.  No acute new traumatic injuries.  He does have some abrasions to his lower extremities which patient states he had from a prior fall, confirmed with PT note from prior to fall.  Low suspicion for acute traumatic injury, occult fracture, intracranial abnormality.   Dhamar Gregory A, PA-C 05/11/21 1548    Alvira Monday, MD 05/14/21 1547

## 2021-05-11 NOTE — ED Notes (Signed)
RN into assess pt. Education on using call bell if needing assistance. Pt verbalized understanding. RN assisted pt with changing positions. Will continue to monitor.

## 2021-05-11 NOTE — ED Provider Notes (Signed)
[name] EMERGENCY DEPARTMENT Provider Note   CSN: [id number] Arrival date & time: 05/10/21  1805     History Chief Complaint  Patient presents with  . Fever    Nathan Marsh is a 45 y.o. male.  Patient presents to the emergency department for evaluation of fever and generalized weakness.  Patient also has had urinary frequency and urinary incontinence.  Concern for possible UTI.  Patient does have a cough but is not short of breath.  No nausea, vomiting, diarrhea.  He has had multiple falls recently.  No injuries noted from falls.        Past Medical History:  Diagnosis Date  . Multiple sclerosis [facility])     Patient Active Problem List   Diagnosis Date Noted  . Tobacco abuse 05/31/2020  . Finger injury 04/27/2017  . Ataxia 06/21/2013  . Cerebellar tremor 06/21/2013  . Essential tremor 06/21/2013  . Multiple sclerosis (HCC) 10/26/2007    Past Surgical History:  Procedure Laterality Date  . AMPUTATION Left 04/27/2017   Procedure: AMPUTATION DIGIT REVISION LEFT MIDDLE FINGER;  Surgeon: Knute Neu, MD;  Location: [facility] OR;  Service: Orthopedics;  Laterality: Left;       Family History  Problem Relation Age of Onset  . Cancer Mother        lung    Social History   Tobacco Use  . Smoking status: Current Every Day Smoker    Packs/day: 0.50    Types: Cigarettes    Start date: 05/31/2000  . Smokeless tobacco: Never Used  Vaping Use  . Vaping Use: Never used  Substance Use Topics  . Alcohol use: Yes    Comment: very limited  . Drug use: Yes    Types: Marijuana    Home Medications Prior to Admission medications   Medication Sig Start Date End Date Taking? Authorizing Provider  nirmatrelvir/ritonavir EUA (PAXLOVID) TABS Patient GFR is >60. Take nirmatrelvir (150 mg) 2 tablet(s) twice daily for 5 days and ritonavir (100 mg) one tablet twice daily for 5 days. 05/11/21  Yes Shigeo Baugh, Canary Brim, MD  natalizumab (TYSABRI) 300 MG/15ML  injection Inject into the vein every 30 (thirty) days.    [provider]  oxybutynin (DITROPAN) 5 MG tablet Take 1 tablet by mouth 2 (two) times daily. Patient not taking: Reported on 05/06/2021 09/09/20   [provider]    Allergies    Patient has no known allergies.  Review of Systems   Review of Systems  Constitutional: Positive for fatigue and fever.  Respiratory: Positive for cough.   Genitourinary: Positive for frequency.  All other systems reviewed and are negative.   Physical Exam Updated Vital Signs BP 132/79   Pulse 70   Temp 98.9 F (37.2 C) (Oral)   Resp 15   SpO2 99%   Physical Exam Vitals and nursing note reviewed.  Constitutional:      General: He is not in acute distress.    Appearance: Normal appearance. He is well-developed.  HENT:     Head: Normocephalic and atraumatic.     Right Ear: Hearing normal.     Left Ear: Hearing normal.     Nose: Nose normal.  Eyes:     Conjunctiva/sclera: Conjunctivae normal.     Pupils: Pupils are equal, round, and reactive to light.  Cardiovascular:     Rate and Rhythm: Regular rhythm.     Heart sounds: S1 normal and S2 normal. No murmur heard. No friction rub. No  gallop.   Pulmonary:     Effort: Pulmonary effort is normal. No respiratory distress.     Breath sounds: Normal breath sounds.  Chest:     Chest wall: No tenderness.  Abdominal:     General: Bowel sounds are normal.     Palpations: Abdomen is soft.     Tenderness: There is no abdominal tenderness. There is no guarding or rebound. Negative signs include Murphy's sign and McBurney's sign.     Hernia: No hernia is present.  Musculoskeletal:        General: Normal range of motion.     Cervical back: Normal range of motion and neck supple.  Skin:    General: Skin is warm and dry.     Findings: No rash.  Neurological:     Mental Status: He is alert and oriented to person, place, and time.     GCS: GCS eye subscore is 4. GCS verbal  subscore is 5. GCS motor subscore is 6.     Cranial Nerves: No cranial nerve deficit.     Sensory: No sensory deficit.     Coordination: Coordination normal.  Psychiatric:        Speech: Speech normal.        Behavior: Behavior normal.        Thought Content: Thought content normal.     ED Results / Procedures / Treatments   Labs (all labs ordered are listed, but only abnormal results are displayed) Labs Reviewed  SARS CORONAVIRUS 2 (TAT 6-24 HRS) - Abnormal; Notable for the following components:      Result Value   SARS Coronavirus 2 POSITIVE (*)    All other components within normal limits  CBC - Abnormal; Notable for the following components:   Hemoglobin 12.4 (*)    MCV 77.1 (*)    MCH 23.5 (*)    RDW 15.6 (*)    Platelets 120 (*)    nRBC 0.7 (*)    All other components within normal limits  BASIC METABOLIC PANEL  URINALYSIS, ROUTINE W REFLEX MICROSCOPIC  CBG MONITORING, ED    EKG None  Radiology DG Chest Port 1 View  Result Date: 05/11/2021 CLINICAL DATA:  Cough.  COVID positive. EXAM: PORTABLE CHEST 1 VIEW COMPARISON:  06/06/2008 FINDINGS: Normal heart size and mediastinal contours. Subsegmental opacity in both lung bases. No pleural effusion or pneumothorax. No pulmonary edema. Scoliotic curvature of the spine. Thoracic cage findings suspicious for muscle atrophy. IMPRESSION: Subsegmental bibasilar opacities, favor atelectasis, however COVID pneumonia can have a similar appearance. Electronically Signed   By: Narda Rutherford M.D.   On: 05/11/2021 02:46    Procedures Procedures   Medications Ordered in ED Medications  acetaminophen (TYLENOL) tablet 650 mg (650 mg Oral Given 05/10/21 1815)    ED Course  I have reviewed the triage vital signs and the nursing notes.  Pertinent labs & imaging results that were available during my care of the patient were reviewed by me and considered in my medical decision making (see chart for details).    MDM  Rules/Calculators/A&P                          Patient with frequent falls secondary to MS has had increased falls recently.  He has had some generalized weakness associated with fever, cough.  He has not been experiencing any shortness of breath.  Lungs are clear and oxygenation is normal.  X-ray with atelectasis, cannot  rule out early COVID changes.  Patient is unvaccinated.  He is at risk for worsening of his condition secondary to COVID infection.  He does not, however, meet any criteria for admission at this time.  This was discussed with his sister, Nathan Marsh [phone].  She did confirm that the patient lives alone at this time.  He does have some visiting help for several hours a day but at night and the rest of the time he is on his own.  He has been resistant to placement in the past, despite the family's wishes.  We will hold the patient in the ED to determine if there are any advanced services available to him temporarily in the setting of COVID.  I am concerned that he will develop a pneumonia and worsening condition, but has already stated, does not meet criteria for admission.  Will prescribe Paxlovid as this is likely his most effective treatment.  Final Clinical Impression(s) / ED Diagnoses Final diagnoses:  COVID-19    Rx / DC Orders ED Discharge Orders         Ordered    nirmatrelvir/ritonavir EUA (PAXLOVID) TABS        05/11/21 0357           Gilda Crease, MD 05/11/21 [phone]

## 2021-05-12 DIAGNOSIS — U071 COVID-19: Secondary | ICD-10-CM | POA: Diagnosis not present

## 2021-05-12 DIAGNOSIS — R5381 Other malaise: Secondary | ICD-10-CM | POA: Diagnosis not present

## 2021-05-12 DIAGNOSIS — R531 Weakness: Secondary | ICD-10-CM | POA: Diagnosis not present

## 2021-05-12 MED ORDER — BEBTELOVIMAB 175 MG/2 ML IV (EUA)
175.0000 mg | Freq: Once | INTRAMUSCULAR | Status: AC
  Administered 2021-05-12: 175 mg via INTRAVENOUS
  Filled 2021-05-12: qty 2

## 2021-05-12 MED ORDER — METHYLPREDNISOLONE SODIUM SUCC 125 MG IJ SOLR: 125.0000 mg | Freq: Once | INTRAMUSCULAR | Status: DC | PRN

## 2021-05-12 MED ORDER — DIPHENHYDRAMINE HCL 50 MG/ML IJ SOLN: 50.0000 mg | Freq: Once | INTRAMUSCULAR | Status: DC | PRN

## 2021-05-12 MED ORDER — ALBUTEROL SULFATE HFA 108 (90 BASE) MCG/ACT IN AERS: 2.0000 | INHALATION_SPRAY | Freq: Once | RESPIRATORY_TRACT | Status: DC | PRN

## 2021-05-12 MED ORDER — FAMOTIDINE IN NACL 20-0.9 MG/50ML-% IV SOLN
20.0000 mg | Freq: Once | INTRAVENOUS | Status: DC | PRN
  Filled 2021-05-12: qty 50

## 2021-05-12 MED ORDER — EPINEPHRINE 0.3 MG/0.3ML IJ SOAJ: 0.3000 mg | Freq: Once | INTRAMUSCULAR | Status: DC | PRN

## 2021-05-12 MED ORDER — SODIUM CHLORIDE 0.9 % IV SOLN: INTRAVENOUS | Status: DC | PRN

## 2021-05-12 NOTE — ED Notes (Signed)
Pt assisted with eating apple sauce. TV turned on for pt. No complaints at this time.

## 2021-05-12 NOTE — Discharge Instructions (Addendum)
Please follow-up with your primary care doctor or with the COVID clinic.  If you develop any difficulty in breathing, chest pain, or other new concerning symptom, come back to ER for reassessment.

## 2021-05-12 NOTE — ED Notes (Signed)
I spoke with pt's father, which was ok'd by pt, father is aware that pt wants to go home and that we are trying to facilitate this request.  I also spoke with social worker and she is aware of this request.  Dr. Jonelle Sidle aware and requests that I contact pharmacy regarding out pt remdesivir tx

## 2021-05-12 NOTE — ED Provider Notes (Addendum)
Emergency Medicine Observation Re-evaluation Note  Nathan Marsh is a 45 y.o. male, seen on rounds today.  Pt initially presented to the ED for complaints of Fever Currently, the patient is sitting up in bed in no distress.  Physical Exam  BP 108/65   Pulse 64   Temp 99 F (37.2 C) (Oral)   Resp 15   SpO2 100%  Physical Exam General: calm, no acute distress Cardiac: warm and well perfused Lungs: even and unlabored Psych: calm  ED Course / MDM  EKG:EKG Interpretation  Date/Time:  Saturday May 10 2021 18:22:44 EDT Ventricular Rate:  71 PR Interval:  128 QRS Duration: 70 QT Interval:  360 QTC Calculation: 391 R Axis:   75 Text Interpretation: Normal sinus rhythm Normal ECG Noprevious ECG Confirmed by Alvira Monday ([id number]) on 05/11/2021 7:16:48 AM   I have reviewed the labs performed to date as well as medications administered while in observation.  Recent changes in the last 24 hours include patient now stating he feels better and wants to go home.  RN discussed with patient's family, father who also discussed further with patient.  Case discussed with COVID treatment team given patient will be discharged before finishing the remdesivir.  Desma Mcgregor Pharm after reviewing case recommended giving dose of bebtelovimab once now before dc, will not need any additional meds after getting this.  Plan   Bebtelovimab now Discharge home F/u PCP or in [place] covid clinic out pt   Milagros Loll, MD 05/12/21 1044    Milagros Loll, MD 05/12/21 1045

## 2021-05-12 NOTE — ED Notes (Signed)
Pt dressed and waiting on PTAR.  I called his father and informed him that we are still waiting on PTAR and that we would let him know when he was on his way home. Pt requests that condom cath be left on, ok per dr Stevie Kern, pt pleasant and cooperative, pt instructed in care of condom cath. Understanding verbalized

## 2021-05-12 NOTE — ED Notes (Signed)
PTAR was called to transport pt home.  PTAR aware not to arrive until after 11:30 am, as pt is being monitored status post receiving bebtelovimab for covid, pt looking forward to going home, denies any discomfort

## 2021-05-17 ENCOUNTER — Encounter (HOSPITAL_COMMUNITY): Payer: Self-pay

## 2021-05-17 ENCOUNTER — Emergency Department (HOSPITAL_COMMUNITY)
Admission: EM | Admit: 2021-05-17 | Discharge: 2021-05-17 | Disposition: A | Discharge status: Home / Self Care | Payer: Medicare Other | Attending: Emergency Medicine | Admitting: Emergency Medicine

## 2021-05-17 ENCOUNTER — Emergency Department (HOSPITAL_COMMUNITY): Payer: Medicare Other

## 2021-05-17 DIAGNOSIS — F17210 Nicotine dependence, cigarettes, uncomplicated: Secondary | ICD-10-CM | POA: Insufficient documentation

## 2021-05-17 DIAGNOSIS — Y92009 Unspecified place in unspecified non-institutional (private) residence as the place of occurrence of the external cause: Secondary | ICD-10-CM | POA: Diagnosis not present

## 2021-05-17 DIAGNOSIS — S0990XA Unspecified injury of head, initial encounter: Secondary | ICD-10-CM | POA: Insufficient documentation

## 2021-05-17 DIAGNOSIS — Z743 Need for continuous supervision: Secondary | ICD-10-CM | POA: Diagnosis not present

## 2021-05-17 DIAGNOSIS — R519 Headache, unspecified: Secondary | ICD-10-CM | POA: Diagnosis not present

## 2021-05-17 DIAGNOSIS — G4489 Other headache syndrome: Secondary | ICD-10-CM | POA: Diagnosis not present

## 2021-05-17 MED ORDER — ACETAMINOPHEN 325 MG PO TABS
650.0000 mg | ORAL_TABLET | Freq: Once | ORAL | Status: AC
  Administered 2021-05-17: 650 mg via ORAL
  Filled 2021-05-17: qty 2

## 2021-05-17 NOTE — Discharge Instructions (Signed)
CT scan did not show any signs of a brain injury.   Recommend you take Tylenol for pain at home.
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
## 2021-05-17 NOTE — ED Notes (Signed)
[facility] call for transportation home.
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
## 2021-05-19 ENCOUNTER — Telehealth: Payer: Self-pay

## 2021-05-19 NOTE — Telephone Encounter (Signed)
[name] is calling in stating that the director of [employer] contacted her stating they are not able to provide transportation to the patient any longer due to the paperwork not being filled out by his PCP. [name] is wondering if we have received this paperwork or if she needs to have them send it back again.

## 2021-05-19 NOTE — Telephone Encounter (Signed)
Spoke to pt's sister [name], told her I have had the papers and had tried to contact pt but unsuccessful. I have most of the form done but had a few questions. [name] said she filled out the form for pt and could answer questions. Went over form and completed with [name]. Told her I will fax today to [facility] [place]. [name] verbalized understanding. Forms completed and signed by Worthy Rancher, NP. Faxed forms to [facility] [place] at [phone].

## 2021-05-20 ENCOUNTER — Telehealth: Payer: Self-pay

## 2021-05-20 NOTE — Telephone Encounter (Signed)
Spoke to pt's sister Nathan Marsh, told her I faxed the forms over yesterday to [facility], but I received a fax for Part B needs to have his signature on. Asked her if she can come by and get form and have him sign and I will re-fax. Nathan Marsh said she will come by today with him in car to sign paper work. Told her okay I am here till 4:30 today. Nathan Marsh verbalized understanding.
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
## 2021-05-22 ENCOUNTER — Telehealth: Payer: Self-pay
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
## 2021-05-27 DIAGNOSIS — G35 Multiple sclerosis: Secondary | ICD-10-CM | POA: Diagnosis not present

## 2021-05-27 NOTE — Telephone Encounter (Signed)
FYI

## 2021-05-28 NOTE — Telephone Encounter (Signed)
Pt and sister came by last Thursday and signed papers per [facility] and she faxed them over to [employer]. Sent to scan to be put in pt's chart.
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
## 2021-06-12 ENCOUNTER — Ambulatory Visit: Payer: Medicare Other | Admitting: Family Medicine

## 2021-06-19 NOTE — Telephone Encounter (Signed)
Pt cancelled appt with Dr. Jimmey Ralph has reschedule for 7/19 with Worthy Rancher, NP.

## 2021-06-24 DIAGNOSIS — G35 Multiple sclerosis: Secondary | ICD-10-CM | POA: Diagnosis not present

## 2021-06-25 DIAGNOSIS — E7800 Pure hypercholesterolemia, unspecified: Secondary | ICD-10-CM | POA: Diagnosis not present

## 2021-06-25 DIAGNOSIS — G35 Multiple sclerosis: Secondary | ICD-10-CM | POA: Diagnosis not present

## 2021-06-25 DIAGNOSIS — Z0000 Encounter for general adult medical examination without abnormal findings: Secondary | ICD-10-CM | POA: Diagnosis not present

## 2021-06-25 DIAGNOSIS — E559 Vitamin D deficiency, unspecified: Secondary | ICD-10-CM | POA: Diagnosis not present

## 2021-06-25 DIAGNOSIS — Z79899 Other long term (current) drug therapy: Secondary | ICD-10-CM | POA: Diagnosis not present

## 2021-07-03 ENCOUNTER — Encounter: Payer: Medicare Other | Admitting: Family

## 2021-07-10 ENCOUNTER — Ambulatory Visit (INDEPENDENT_AMBULATORY_CARE_PROVIDER_SITE_OTHER): Payer: Medicare Other | Admitting: Physician Assistant

## 2021-07-10 ENCOUNTER — Other Ambulatory Visit: Payer: Self-pay

## 2021-07-10 ENCOUNTER — Ambulatory Visit: Payer: Medicare Other | Admitting: Orthopedic Surgery

## 2021-07-10 ENCOUNTER — Encounter: Payer: Self-pay | Admitting: Physician Assistant

## 2021-07-10 DIAGNOSIS — B351 Tinea unguium: Secondary | ICD-10-CM | POA: Diagnosis not present

## 2021-07-10 NOTE — Progress Notes (Signed)
Office Visit Note   Patient: Nathan Marsh           Date of Birth: 1976/04/29           MRN: [id number] Visit Date: 07/10/2021              Requested by: Jarold Motto, [name] [address] [place],  [place] [address] PCP: Jarold Motto, [name]  No chief complaint on file.     HPI: Patient is a pleasant 45 year old gentleman who comes in periodically for nail trimming.  He has no other complaints and is unable to do this for himself  Assessment & Plan: Visit Diagnoses: No diagnosis found.  Plan: We will follow-up in 3 months for repeat nail trimming  Follow-Up Instructions: No follow-ups on file.   Ortho Exam  Patient is alert, oriented, no adenopathy, well-dressed, normal affect, normal respiratory effort. Bilateral feet onychomycotic nails x10.  He has no open ulcers mild soft tissue swelling feet are warm without any evidence of infection or vascular compromise pulses are palpable.  Onychomycotic nails were trimmed x10  Imaging: No results found. No images are attached to the encounter.  Labs: No results found for: HGBA1C, ESRSEDRATE, CRP, LABURIC, REPTSTATUS, GRAMSTAIN, CULT, LABORGA   Lab Results  Component Value Date   ALBUMIN 4.5 05/31/2020   ALBUMIN 4.3 06/06/2008    No results found for: MG No results found for: VD25OH  No results found for: PREALBUMIN CBC EXTENDED Latest Ref Rng & Units 05/10/2021 06/06/2008  WBC 4.0 - 10.5 K/uL 6.1 4.1  RBC 4.22 - 5.81 MIL/uL 5.28 6.25(H)  HGB 13.0 - 17.0 g/dL 12.4(L) 14.5  HCT 39.0 - 52.0 % 40.7 44.7  PLT 150 - 400 K/uL 120(L) 124 PLATELET COUNT CONFIRMED BY SMEAR SPECIMEN CHECKED FOR CLOTS(L)  NEUTROABS - - 1.9  LYMPHSABS - - 1.6     There is no height or weight on file to calculate BMI.  Orders:  No orders of the defined types were placed in this encounter.  No orders of the defined types were placed in this encounter.    Procedures: No procedures performed  Clinical Data: No additional  findings.  ROS:  All other systems negative, except as noted in the HPI. Review of Systems  Objective: Vital Signs: There were no vitals taken for this visit.  Specialty Comments:  No specialty comments available.  PMFS History: Patient Active Problem List   Diagnosis Date Noted   Tobacco abuse 05/31/2020   Finger injury 04/27/2017   Ataxia 06/21/2013   Cerebellar tremor 06/21/2013   Essential tremor 06/21/2013   Multiple sclerosis (HCC) 10/26/2007   Past Medical History:  Diagnosis Date   Multiple sclerosis (HCC)     Family History  Problem Relation Age of Onset   Cancer Mother        lung    Past Surgical History:  Procedure Laterality Date   AMPUTATION Left 04/27/2017   Procedure: AMPUTATION DIGIT REVISION LEFT MIDDLE FINGER;  Surgeon: Knute Neu, MD;  Location: [facility] OR;  Service: Orthopedics;  Laterality: Left;   Social History   Occupational History   Not on file  Tobacco Use   Smoking status: Every Day    Packs/day: 0.50    Types: Cigarettes    Start date: 05/31/2000   Smokeless tobacco: Never  Vaping Use   Vaping Use: Never used  Substance and Sexual Activity   Alcohol use: Yes    Comment: very limited   Drug use: Yes  Types: Marijuana   Sexual activity: Not Currently

## 2021-07-15 ENCOUNTER — Encounter: Payer: Medicare Other | Admitting: Family

## 2021-07-21 ENCOUNTER — Other Ambulatory Visit: Payer: Self-pay

## 2021-07-21 ENCOUNTER — Encounter: Payer: Self-pay | Admitting: Physician Assistant

## 2021-07-21 ENCOUNTER — Ambulatory Visit (INDEPENDENT_AMBULATORY_CARE_PROVIDER_SITE_OTHER): Payer: Medicare Other | Admitting: Physician Assistant

## 2021-07-21 VITALS — BP 108/71 | HR 71 | Temp 98.0°F | Ht 65.0 in

## 2021-07-21 DIAGNOSIS — Z131 Encounter for screening for diabetes mellitus: Secondary | ICD-10-CM | POA: Diagnosis not present

## 2021-07-21 DIAGNOSIS — Z0000 Encounter for general adult medical examination without abnormal findings: Secondary | ICD-10-CM | POA: Diagnosis not present

## 2021-07-21 DIAGNOSIS — Z720 Tobacco use: Secondary | ICD-10-CM | POA: Diagnosis not present

## 2021-07-21 DIAGNOSIS — Z13220 Encounter for screening for lipoid disorders: Secondary | ICD-10-CM | POA: Diagnosis not present

## 2021-07-21 DIAGNOSIS — I10 Essential (primary) hypertension: Secondary | ICD-10-CM

## 2021-07-21 DIAGNOSIS — G35 Multiple sclerosis: Secondary | ICD-10-CM

## 2021-07-21 DIAGNOSIS — Z1211 Encounter for screening for malignant neoplasm of colon: Secondary | ICD-10-CM

## 2021-07-21 LAB — CBC WITH DIFFERENTIAL/PLATELET
Basophils Absolute: 0 10*3/uL (ref 0.0–0.1)
Basophils Relative: 0.7 % (ref 0.0–3.0)
Eosinophils Absolute: 0.2 10*3/uL (ref 0.0–0.7)
Eosinophils Relative: 3.5 % (ref 0.0–5.0)
HCT: 38.8 % — ABNORMAL LOW (ref 39.0–52.0)
Hemoglobin: 12.2 g/dL — ABNORMAL LOW (ref 13.0–17.0)
Lymphocytes Relative: 39.3 % (ref 12.0–46.0)
Lymphs Abs: 2.5 10*3/uL (ref 0.7–4.0)
MCHC: 31.5 g/dL (ref 30.0–36.0)
MCV: 75 fl — ABNORMAL LOW (ref 78.0–100.0)
Monocytes Absolute: 0.6 10*3/uL (ref 0.1–1.0)
Monocytes Relative: 9.2 % (ref 3.0–12.0)
Neutro Abs: 3.1 10*3/uL (ref 1.4–7.7)
Neutrophils Relative %: 47.3 % (ref 43.0–77.0)
Platelets: 141 10*3/uL — ABNORMAL LOW (ref 150.0–400.0)
RBC: 5.18 Mil/uL (ref 4.22–5.81)
RDW: 15.9 % — ABNORMAL HIGH (ref 11.5–15.5)
WBC: 6.5 10*3/uL (ref 4.0–10.5)

## 2021-07-21 LAB — COMPREHENSIVE METABOLIC PANEL
ALT: 6 U/L (ref 0–53)
AST: 14 U/L (ref 0–37)
Albumin: 4.6 g/dL (ref 3.5–5.2)
Alkaline Phosphatase: 64 U/L (ref 39–117)
BUN: 15 mg/dL (ref 6–23)
CO2: 30 mEq/L (ref 19–32)
Calcium: 9.6 mg/dL (ref 8.4–10.5)
Chloride: 104 mEq/L (ref 96–112)
Creatinine, Ser: 1 mg/dL (ref 0.40–1.50)
GFR: 90.77 mL/min (ref >60.00)
Glucose, Bld: 78 mg/dL (ref 70–99)
Potassium: 4.3 mEq/L (ref 3.5–5.1)
Sodium: 141 mEq/L (ref 135–145)
Total Bilirubin: 0.8 mg/dL (ref 0.2–1.2)
Total Protein: 7 g/dL (ref 6.0–8.3)

## 2021-07-21 LAB — LIPID PANEL
Cholesterol: 140 mg/dL (ref 0–200)
HDL: 50.2 mg/dL (ref >39.00)
LDL Cholesterol: 78 mg/dL (ref 0–99)
NonHDL: 90.28
Total CHOL/HDL Ratio: 3
Triglycerides: 61 mg/dL (ref 0.0–149.0)
VLDL: 12.2 mg/dL (ref 0.0–40.0)

## 2021-07-21 NOTE — Progress Notes (Addendum)
Established Patient Office Visit  Subjective:  Patient ID: Nathan Marsh, male    DOB: Oct 21, 1976  Age: 45 y.o. MRN: [id number]  CC:  Chief Complaint  Patient presents with   Annual Exam    HPI Nathan Marsh presents for annual exam. Hx of MS. Wheelchair bound.  He is accompanied by his PCA worker and [name], Interior and spatial designer, from [facility] home care. His PCA is with him from 9 am till 8 pm. He receives assistance with meal prep, but he is able to complete most other things by himself. He does not have care throughout the night.   Acute concerns: None  Health maintenance: Lifestyle/ exercise: Does well to the best of his ability Nutrition: Eats healthy. Mental health: Good Caffeine: None Sleep:  helper put him on bed at 8:00 pm. Substance use: Smokes 1/2 ppd  Sexual activity: No Immunizations: UTD Colonoscopy: He is willing to undergo colonoscopy or Cologuard this year.   Past Medical History:  Diagnosis Date   Multiple sclerosis [facility])     Past Surgical History:  Procedure Laterality Date   AMPUTATION Left 04/27/2017   Procedure: AMPUTATION DIGIT REVISION LEFT MIDDLE FINGER;  Surgeon: Knute Neu, MD;  Location: [facility] OR;  Service: Orthopedics;  Laterality: Left;    Family History  Problem Relation Age of Onset   Cancer Mother        lung    Social History   Socioeconomic History   Marital status: Single    Spouse name: Not on file   Number of children: Not on file   Years of education: Not on file   Highest education level: Not on file  Occupational History   Not on file  Tobacco Use   Smoking status: Every Day    Packs/day: 0.50    Types: Cigarettes    Start date: 05/31/2000   Smokeless tobacco: Never  Vaping Use   Vaping Use: Never used  Substance and Sexual Activity   Alcohol use: Yes    Comment: very limited   Drug use: Yes    Types: Marijuana   Sexual activity: Not Currently  Other Topics Concern   Not on file  Social History Narrative   Live alone    Single   No children   Has disability       Social Determinants of Health   Financial Resource Strain: Not on file  Food Insecurity: Not on file  Transportation Needs: Not on file  Physical Activity: Not on file  Stress: Not on file  Social Connections: Not on file  Intimate Partner Violence: Not on file    Outpatient Medications Prior to Visit  Medication Sig Dispense Refill   oxybutynin (DITROPAN) 5 MG tablet Take 1 tablet by mouth 2 (two) times daily.     No facility-administered medications prior to visit.    No Known Allergies  ROS Review of Systems  Constitutional:  Negative for fatigue and fever.  HENT:  Negative for congestion and sore throat.   Respiratory:  Negative for cough, chest tightness and shortness of breath.   Cardiovascular:  Negative for chest pain and palpitations.  Gastrointestinal:  Negative for abdominal pain, constipation, diarrhea, nausea and vomiting.  Neurological:  Negative for light-headedness and headaches.     Objective:    Physical Exam Constitutional:      Appearance: Normal appearance.  HENT:     Head: Normocephalic and atraumatic.     Right Ear: Tympanic membrane and external ear normal.  Left Ear: Tympanic membrane and external ear normal.     Nose: Nose normal.  Eyes:     Extraocular Movements: Extraocular movements intact.     Pupils: Pupils are equal, round, and reactive to light.  Cardiovascular:     Rate and Rhythm: Normal rate and regular rhythm.     Pulses: Normal pulses.     Heart sounds: Normal heart sounds.  Pulmonary:     Effort: Pulmonary effort is normal.     Breath sounds: Normal breath sounds.  Abdominal:     General: Abdomen is flat.     Palpations: Abdomen is soft.  Musculoskeletal:     Comments: Wheelchair bound  Skin:    General: Skin is warm and dry.  Neurological:     Mental Status: He is alert and oriented to person, place, and time.    BP 108/71   Pulse 71   Temp 98 F (36.7 C)    Ht 5\' 5"  (1.651 m)   SpO2 99%   BMI 23.30 kg/m  Wt Readings from Last 3 Encounters:  05/06/21 139 lb 15.9 oz (63.5 kg)  12/25/20 140 lb (63.5 kg)  05/30/20 134 lb (60.8 kg)     Health Maintenance Due  Topic Date Due   COVID-19 Vaccine (1) Never done   Pneumococcal Vaccine 61-42 Years old (1 - PCV) Never done   HIV Screening  Never done   Hepatitis C Screening  Never done   COLONOSCOPY (Pts 45-21yrs Insurance coverage will need to be confirmed)  Never done    There are no preventive care reminders to display for this patient.  No results found for: TSH Lab Results  Component Value Date   WBC 6.1 05/10/2021   HGB 12.4 (L) 05/10/2021   HCT 40.7 05/10/2021   MCV 77.1 (L) 05/10/2021   PLT 120 (L) 05/10/2021   Lab Results  Component Value Date   NA 139 05/10/2021   K 3.8 05/10/2021   CO2 29 05/10/2021   GLUCOSE 96 05/10/2021   BUN 17 05/10/2021   CREATININE 1.24 05/10/2021   BILITOT 0.4 05/31/2020   ALKPHOS 74 05/31/2020   AST 14 05/31/2020   ALT 11 05/31/2020   PROT 6.7 05/31/2020   ALBUMIN 4.5 05/31/2020   CALCIUM 8.9 05/10/2021   ANIONGAP 8 05/10/2021   GFR 97.96 05/31/2020   Lab Results  Component Value Date   CHOL 138 05/31/2020   Lab Results  Component Value Date   HDL 46.60 05/31/2020   Lab Results  Component Value Date   LDLCALC 71 05/31/2020   Lab Results  Component Value Date   TRIG 101.0 05/31/2020   Lab Results  Component Value Date   CHOLHDL 3 05/31/2020   No results found for: HGBA1C    Assessment & Plan:   Problem List Items Addressed This Visit       Nervous and Auditory   Multiple sclerosis (HCC)   Relevant Orders   Ambulatory referral to Ophthalmology     Other   Tobacco abuse   Other Visit Diagnoses     Encounter for annual physical exam    -  Primary   Relevant Orders   CBC with Differential/Platelet   Comprehensive metabolic panel   Lipid panel   Ambulatory referral to Ophthalmology   Diabetes mellitus  screening       Relevant Orders   Comprehensive metabolic panel   Screening for cholesterol level       Relevant Orders  Lipid panel   Screening for colon cancer       Relevant Orders   Cologuard       No orders of the defined types were placed in this encounter.   Follow-up: Return in about 1 year (around 07/21/2022) for CPE and fasting labs with Sam .   Age-appropriate screening and counseling performed today. Will check labs and call with results. Preventive measures discussed and printed in AVS for patient.  Cologuard has been ordered for colon cancer screening.  He has also been referred to an ophthalmologist.  He has been encouraged to quit smoking.  He is doing well with his home health care and benefits from ongoing daily care.  At this time, he does not need care throughout the night.   I,Savera Zaman,acting as a Neurosurgeon for [employer], PA-C.,have documented all relevant documentation on the behalf of Jacque Garrels M Niley Helbig, PA-C,as directed by  [employer], PA-C while in the presence of Nicha Hemann M Mehki Klumpp, PA-C.  I, Tamu Golz M Lukah Goswami, PA-C, have reviewed all documentation for this visit. The documentation on 07/23/21 for the exam, diagnosis, procedures, and orders are all accurate and complete.  Talbert Nan

## 2021-07-21 NOTE — Progress Notes (Deleted)
Established Patient Office Visit  Subjective:  Patient ID: Nathan Marsh, male    DOB: 1976-05-13  Age: 45 y.o. MRN: [id number]  CC:  Chief Complaint  Patient presents with   Annual Exam    HPI Nathan Marsh presents for annual exam.  Acute concerns: ***  Health maintenance: Lifestyle/ exercise: *** Nutrition: *** Mental health: *** Caffeine: *** Sleep: *** Substance use: *** Sexual activity: *** Immunizations: *** Colonoscopy: ***   Past Medical History:  Diagnosis Date   Multiple sclerosis (HCC)     Past Surgical History:  Procedure Laterality Date   AMPUTATION Left 04/27/2017   Procedure: AMPUTATION DIGIT REVISION LEFT MIDDLE FINGER;  Surgeon: Knute Neu, MD;  Location: [facility];  Service: Orthopedics;  Laterality: Left;    Family History  Problem Relation Age of Onset   Cancer Mother        lung    Social History   Socioeconomic History   Marital status: Single    Spouse name: Not on file   Number of children: Not on file   Years of education: Not on file   Highest education level: Not on file  Occupational History   Not on file  Tobacco Use   Smoking status: Every Day    Packs/day: 0.50    Types: Cigarettes    Start date: 05/31/2000   Smokeless tobacco: Never  Vaping Use   Vaping Use: Never used  Substance and Sexual Activity   Alcohol use: Yes    Comment: very limited   Drug use: Yes    Types: Marijuana   Sexual activity: Not Currently  Other Topics Concern   Not on file  Social History Narrative   Live alone   Single   No children   Has disability       Social Determinants of Health   Financial Resource Strain: Not on file  Food Insecurity: Not on file  Transportation Needs: Not on file  Physical Activity: Not on file  Stress: Not on file  Social Connections: Not on file  Intimate Partner Violence: Not on file    Outpatient Medications Prior to Visit  Medication Sig Dispense Refill   oxybutynin (DITROPAN) 5 MG  tablet Take 1 tablet by mouth 2 (two) times daily.     No facility-administered medications prior to visit.    No Known Allergies  ROS Review of Systems    Objective:    Physical Exam  BP 108/71   Pulse 71   Temp 98 F (36.7 C)   Ht 5\' 5"  (1.651 m)   SpO2 99%   BMI 23.30 kg/m  Wt Readings from Last 3 Encounters:  05/06/21 139 lb 15.9 oz (63.5 kg)  12/25/20 140 lb (63.5 kg)  05/30/20 134 lb (60.8 kg)     Health Maintenance Due  Topic Date Due   COVID-19 Vaccine (1) Never done   Pneumococcal Vaccine 94-52 Years old (1 - PCV) Never done   HIV Screening  Never done   Hepatitis C Screening  Never done   COLONOSCOPY (Pts 45-31yrs Insurance coverage will need to be confirmed)  Never done    There are no preventive care reminders to display for this patient.  No results found for: TSH Lab Results  Component Value Date   WBC 6.1 05/10/2021   HGB 12.4 (L) 05/10/2021   HCT 40.7 05/10/2021   MCV 77.1 (L) 05/10/2021   PLT 120 (L) 05/10/2021   Lab Results  Component Value Date  NA 139 05/10/2021   K 3.8 05/10/2021   CO2 29 05/10/2021   GLUCOSE 96 05/10/2021   BUN 17 05/10/2021   CREATININE 1.24 05/10/2021   BILITOT 0.4 05/31/2020   ALKPHOS 74 05/31/2020   AST 14 05/31/2020   ALT 11 05/31/2020   PROT 6.7 05/31/2020   ALBUMIN 4.5 05/31/2020   CALCIUM 8.9 05/10/2021   ANIONGAP 8 05/10/2021   GFR 97.96 05/31/2020   Lab Results  Component Value Date   CHOL 138 05/31/2020   Lab Results  Component Value Date   HDL 46.60 05/31/2020   Lab Results  Component Value Date   LDLCALC 71 05/31/2020   Lab Results  Component Value Date   TRIG 101.0 05/31/2020   Lab Results  Component Value Date   CHOLHDL 3 05/31/2020   No results found for: HGBA1C    Assessment & Plan:   Problem List Items Addressed This Visit   None   No orders of the defined types were placed in this encounter.   Follow-up: No follow-ups on file.    Evertte Sones M Alann Avey, PA-C

## 2021-07-21 NOTE — Patient Instructions (Addendum)
Good to meet you today! Please go to the lab for blood work and I will call with results. Cologuard for colon cancer screening has been ordered. You are encouraged to quit smoking. Let me know if you need any help with this!  You should receive a phone call about an eye exam as well.  Call sooner if any concerns.

## 2021-07-22 DIAGNOSIS — G35 Multiple sclerosis: Secondary | ICD-10-CM | POA: Diagnosis not present

## 2021-07-25 ENCOUNTER — Telehealth: Payer: Self-pay

## 2021-07-25 NOTE — Telephone Encounter (Signed)
Nathan Marsh have you received the form?

## 2021-07-25 NOTE — Telephone Encounter (Signed)
[employer] is calling in stating they faxed over a form on Monday and wants to know if we have received it. States patient told them to send it to [facility] because Sam was out.

## 2021-07-29 NOTE — Telephone Encounter (Signed)
Form received and given to Dr. Jimmey Ralph

## 2021-07-31 ENCOUNTER — Telehealth: Payer: Self-pay

## 2021-07-31 NOTE — Telephone Encounter (Signed)
Ander [name] is calling in from [facility] Department, states they were supposed to received the notes from his last visit and havent heard back. Wanting an update, and if someone can give her a call back.

## 2021-07-31 NOTE — Telephone Encounter (Signed)
Spoke with Joy from health department she is wanting to know if patient is competent to live at home alone. She wants to know what the PCP recommendations are,due to safety. Informed her I will call patient to schedule a appt with his PCP for evaluation, once she returns in office next week.

## 2021-08-04 NOTE — Telephone Encounter (Signed)
Please call pt to schedule an appointment with [name] to discuss care.

## 2021-08-04 NOTE — Telephone Encounter (Signed)
Please advise 

## 2021-08-05 NOTE — Telephone Encounter (Signed)
Spoke with [name] (EC), as I couldn't get a hold of patient. Told her I was trying to get a hold of him to schedule an appointment. Cantelle states that he just came in for an appointment to discuss how Jearl was doing at home and discussing the concerns. Sister is wanting to know if whatever concerns or forms there are could be based off the last appointment as she knows that Ermias will probably not want to come in for another appointment.

## 2021-08-05 NOTE — Telephone Encounter (Signed)
Please see message and advise 

## 2021-08-05 NOTE — Telephone Encounter (Signed)
2nd attempt to contact patient, patient's vm does not accept messages.

## 2021-08-05 NOTE — Telephone Encounter (Signed)
Attempted to contact patient, VM is full.  

## 2021-08-06 NOTE — Telephone Encounter (Signed)
Spoke to pt told him [name] is back in the office and she would like you to come in to be evaluated by her. Pt verbalized understanding and said okay. Appt scheduled for Tues Aug 16th at 10:30 AM. Pt and caregiver [name] verbalized understanding.

## 2021-08-12 ENCOUNTER — Ambulatory Visit (INDEPENDENT_AMBULATORY_CARE_PROVIDER_SITE_OTHER): Payer: Medicare Other | Admitting: Physician Assistant

## 2021-08-12 ENCOUNTER — Telehealth: Payer: Self-pay | Admitting: *Deleted

## 2021-08-12 ENCOUNTER — Encounter: Payer: Self-pay | Admitting: Physician Assistant

## 2021-08-12 ENCOUNTER — Other Ambulatory Visit: Payer: Self-pay

## 2021-08-12 VITALS — BP 120/62 | HR 44 | Temp 98.0°F | Ht 65.0 in | Wt 140.0 lb

## 2021-08-12 DIAGNOSIS — Z993 Dependence on wheelchair: Secondary | ICD-10-CM | POA: Diagnosis not present

## 2021-08-12 DIAGNOSIS — G35 Multiple sclerosis: Secondary | ICD-10-CM

## 2021-08-12 NOTE — Telephone Encounter (Signed)
Called Alcide Clever with [facility] Dept at [phone] and told her pt was seen today by [name] and she said pt is safe to be alone at night he does not need care during the night. Joi verbalized understanding. Asked her if she needed office notes? Joi said yes please fax # [phone]. Told her I will fax office notes over. Joi verbalized understanding.  Office notes faxed to [facility] [phone].

## 2021-08-12 NOTE — Patient Instructions (Signed)
It was great to see you!  We will try to connect you with a Child psychotherapist.  If you need any more forms for your wheelchair, please let me know!  Take care,  Jarold Motto PA-C

## 2021-08-12 NOTE — Progress Notes (Signed)
Nathan Marsh is a 45 y.o. male is here for follow up.  I acted as a Neurosurgeon for [employer], PA-C Corky Mull, LPN   History of Present Illness:   Chief Complaint  Patient presents with   Follow-up    HPI  Pt is here for follow up today to discuss care at home. We received communication from Nathan Marsh at the [facility] department inquiring for documentation of patient's current caregiver needs.  Patient has hx of MS. Doing well overall with regularly scheduled appointments with Nathan Lanes, NP at neurology. He is here today with his caregiver. He continues to have care during the day. His caregiver puts him in bed and leaves around 8p. He does not have care at night and has not had any related falls due to this. He enjoys having independence at home and both he and his caregiver feel that he is meeting his health needs while at home.  He is also hoping to have his motorized wheelchair repaired soon. He states that it has been out of commission.   He is also interested in having a Child psychotherapist assigned to him to see if that can help with his daily life and management of chronic medical issues.   Health Maintenance Due  Topic Date Due   Pneumococcal Vaccine 68-54 Years old (1 - PCV) Never done   HIV Screening  Never done   Hepatitis C Screening  Never done   COLONOSCOPY (Pts 45-16yrs Insurance coverage will need to be confirmed)  Never done    Past Medical History:  Diagnosis Date   Multiple sclerosis (HCC)      Social History   Tobacco Use   Smoking status: Every Day    Packs/day: 0.50    Types: Cigarettes    Start date: 05/31/2000   Smokeless tobacco: Never  Vaping Use   Vaping Use: Never used  Substance Use Topics   Alcohol use: Yes    Comment: very limited   Drug use: Yes    Types: Marijuana    Past Surgical History:  Procedure Laterality Date   AMPUTATION Left 04/27/2017   Procedure: AMPUTATION DIGIT REVISION LEFT MIDDLE FINGER;   Surgeon: Knute Neu, MD;  Location: [facility] OR;  Service: Orthopedics;  Laterality: Left;    Family History  Problem Relation Age of Onset   Cancer Mother        lung    PMHx, SurgHx, SocialHx, FamHx, Medications, and Allergies were reviewed in the Visit Navigator and updated as appropriate.   Patient Active Problem List   Diagnosis Date Noted   Tobacco abuse 05/31/2020   Finger injury 04/27/2017   Ataxia 06/21/2013   Cerebellar tremor 06/21/2013   Essential tremor 06/21/2013   Multiple sclerosis (HCC) 10/26/2007    Social History   Tobacco Use   Smoking status: Every Day    Packs/day: 0.50    Types: Cigarettes    Start date: 05/31/2000   Smokeless tobacco: Never  Vaping Use   Vaping Use: Never used  Substance Use Topics   Alcohol use: Yes    Comment: very limited   Drug use: Yes    Types: Marijuana    Current Medications and Allergies:    Current Outpatient Medications:    oxybutynin (DITROPAN) 5 MG tablet, Take 1 tablet by mouth 2 (two) times daily., Disp: , Rfl:   No Known Allergies  Review of Systems   ROS Negative unless otherwise specified per HPI.  Vitals:  Vitals:   08/12/21 0956  BP: 120/62  Pulse: (!) 44  Temp: 98 F (36.7 C)  TempSrc: Temporal  SpO2: 100%  Weight: 140 lb (63.5 kg)  Height: 5\' 5"  (1.651 m)     Body mass index is 23.3 kg/m.   Physical Exam:    Physical Exam Vitals and nursing note reviewed.  Constitutional:      Appearance: He is well-developed. He is cachectic.     Comments: In wheelchair  HENT:     Head: Normocephalic.  Eyes:     Conjunctiva/sclera: Conjunctivae normal.     Pupils: Pupils are equal, round, and reactive to light.  Pulmonary:     Effort: Pulmonary effort is normal.  Musculoskeletal:        General: Normal range of motion.     Cervical back: Normal range of motion.  Skin:    General: Skin is warm and dry.  Neurological:     Mental Status: He is alert and oriented to person, place, and time.   Psychiatric:        Behavior: Behavior normal. Behavior is cooperative.        Thought Content: Thought content normal.        Judgment: Judgment normal.     Assessment and Plan:    [name] was seen today for follow-up.  Diagnoses and all orders for this visit:  Multiple sclerosis (HCC); Dependence on wheelchair Reviewed current plan of care It is my opinion that patient does not need a caregiver during nighttime as of this time, and he does not appear to have any current safety or medical issues requiring increased level of care Referral to Social Worker placed per patient request to help him manage his transportation and provide any additional resources that he may need  We will reach out to Nathan Marsh at [facility] Department to discuss these recommendations. -     AMB Referral to [facility] Coordinaton  CMA or LPN served as scribe during this visit. History, Physical, and Plan performed by medical provider. The above documentation has been reviewed and is accurate and complete.  Time spent with patient today was 15 minutes which consisted of chart review, discussing diagnosis, work up, treatment answering questions and documentation.  [facility], PA-C [place], [place] 08/12/2021  Follow-up: No follow-ups on file.

## 2021-08-14 ENCOUNTER — Telehealth: Payer: Self-pay

## 2021-08-14 NOTE — Chronic Care Management (AMB) (Signed)
  Chronic Care Management   Outreach Note  08/14/2021 Name: [name] MRN: [id number] DOB: 08-11-1976  Nathan Marsh is a 45 y.o. year old male who is a primary care patient of Jarold Motto, [name]. I reached out to Nathan Marsh by phone today in response to a referral sent by [name] PCP, Jarold Motto, PA      An unsuccessful telephone outreach was attempted today. The patient was referred to the case management team for assistance with care management and care coordination.   Follow Up Plan: The care management team will reach out to the patient again over the next 3 days.  If patient returns call to provider office, please advise to call Embedded Care Management Care Guide Penne Lash at [phone]  Penne Lash, RMA Care Guide, Embedded Care Coordination [facility]  [place], [place] [address] Direct Dial: [phone] [email or web address] Website: [email or web address]

## 2021-08-19 ENCOUNTER — Telehealth: Payer: Self-pay

## 2021-08-19 DIAGNOSIS — Z1211 Encounter for screening for malignant neoplasm of colon: Secondary | ICD-10-CM

## 2021-08-19 DIAGNOSIS — G35 Multiple sclerosis: Secondary | ICD-10-CM | POA: Diagnosis not present

## 2021-08-19 NOTE — Telephone Encounter (Signed)
Tried to contact pt no voicemail set up. Referral has been placed for [facility] for Colonoscopy.

## 2021-08-19 NOTE — Telephone Encounter (Signed)
Pt would like a referral for a colonoscopy. Can the referral be placed without being seen?

## 2021-08-19 NOTE — Telephone Encounter (Signed)
Tried to contact pt again, no voicemail. Referral has been placed for colonoscopy.

## 2021-08-20 ENCOUNTER — Ambulatory Visit: Payer: Medicare Other | Admitting: Physician Assistant

## 2021-08-20 ENCOUNTER — Telehealth: Payer: Self-pay | Admitting: Physician Assistant

## 2021-08-20 NOTE — Telephone Encounter (Signed)
   Telephone encounter was:  Successful.  08/20/2021 Name: [name] MRN: [id number] DOB: Jun 18, 1976  [name] is a 45 y.o. year old male who is a primary care patient of Jarold Motto, [name] . The community resource team was consulted for assistance with Transportation Needs  and resources for a life coach.  Care guide performed the following interventions: Patient provided with information about care guide support team and interviewed to confirm resource needs I provided the number for [employer] which [facility] uses for transportation. He requested resources for a life coach, I will research and get back in touch .  Follow Up Plan:  Care guide will follow up with patient by phone over the next week.  April Green Care Guide, Embedded Care Coordination [facility], Care Management Phone: [phone] Email: [email or web address]

## 2021-08-25 NOTE — Chronic Care Management (AMB) (Signed)
  Chronic Care Management   Outreach Note  08/25/2021 Name: DYON ROTERT MRN: [id number] DOB: December 02, 1976  [name] is a 45 y.o. year old male who is a primary care patient of Jarold Motto, [name]. I reached out to [name] by phone today in response to a referral sent by [name] PCP, Jarold Motto, PA      An unsuccessful telephone outreach was attempted today. The patient was referred to the case management team for assistance with care management and care coordination.   Follow Up Plan: The care management team will reach out to the patient again over the next 7 days.  If patient returns call to provider office, please advise to call Embedded Care Management Care Guide Penne Lash at [phone]  Penne Lash, RMA Care Guide, Embedded Care Coordination [facility]  [place], [place] [address] Direct Dial: [phone] [email or web address] Website: [email or web address]

## 2021-08-27 ENCOUNTER — Ambulatory Visit: Payer: Medicare Other | Admitting: Physician Assistant

## 2021-08-27 NOTE — Telephone Encounter (Signed)
Late entry, spoke to pt's care giver Bradly Bienenstock this morning due to had an appointment for referral to GI for Colonoscopy. Told her I have tried to contact pt but he does not answer and voicemail not set up. Told her can cancel pt's appointment today due to I have already placed referral with [facility] and he can contact them to schedule appointment they are behind on appointments. [name] verbalized understanding  and will cancel pt's transportation and let patient know. Told her okay thanks. Appt cancelled for today.

## 2021-09-09 NOTE — Chronic Care Management (AMB) (Signed)
  Chronic Care Management   Outreach Note  09/09/2021 Name: Nathan Marsh MRN: [id number] DOB: [birth year]  Nathan Marsh is a 45 y.o. year old male who is a primary care patient of Jarold Motto, [name]. I reached out to Nathan Marsh by phone today in response to a referral sent by [name] PCP, Jarold Motto, PA      Third unsuccessful telephone outreach was attempted today. The patient was referred to the case management team for assistance with care management and care coordination. The patient's primary care provider has been notified of our unsuccessful attempts to make or maintain contact with the patient. The care management team is pleased to engage with this patient at any time in the future should he/she be interested in assistance from the care management team.   Follow Up Plan: We have been unable to make contact with the patient. The care management team is available to follow up with the patient after provider conversation with the patient regarding recommendation for care management engagement and subsequent re-referral to the care management team.   Penne Lash, RMA Care Guide, Embedded Care Coordination [facility]  [place], [place] [address] Direct Dial: [phone] [email or web address] Website: [email or web address]

## 2021-09-18 DIAGNOSIS — G35 Multiple sclerosis: Secondary | ICD-10-CM | POA: Diagnosis not present

## 2021-10-06 ENCOUNTER — Telehealth: Payer: Self-pay | Admitting: Physician Assistant

## 2021-10-06 NOTE — Telephone Encounter (Signed)
   Telephone encounter was:  Successful.  10/06/2021 Name: Nathan Marsh MRN: [id number] DOB: [birth year]  Nathan Marsh is a 45 y.o. year old male who is a primary care patient of Jarold Motto, [name] . The community resource team was consulted for assistance with  finding a life coach.  Care guide performed the following interventions: Follow up call placed to the patient to discuss status of referral. Patient could not write down the list of life coaches and asked I call back tomorrow morning at 10 am so his aide can assist him.   Follow Up Plan:  Care guide will follow up with patient by phone over the next day.  April Green Care Guide, Embedded Care Coordination [facility], Care Management Phone: [phone] Email: [email or web address]

## 2021-10-07 ENCOUNTER — Telehealth: Payer: Self-pay | Admitting: Physician Assistant

## 2021-10-07 NOTE — Telephone Encounter (Signed)
   Telephone encounter was:  Successful.  10/07/2021 Name: ROARKE MARCIANO MRN: [id number] DOB: 12/05/1976  [name] is a 45 y.o. year old male who is a primary care patient of Jarold Motto, [name] . The community resource team was consulted for assistance with  fining a life coach.    Care guide performed the following interventions: Discussed resources to assist with life coaches. I gave the patient three options for life coaches and the numbers so he can call and see which one is best for his needs. .  Follow Up Plan:  No further follow up planned at this time. The patient has been provided with needed resources.  April Green Care Guide, Embedded Care Coordination [facility], Care Management Phone: [phone] Email: [email or web address]

## 2021-10-08 DIAGNOSIS — G35 Multiple sclerosis: Secondary | ICD-10-CM | POA: Diagnosis not present

## 2021-10-21 DIAGNOSIS — G35 Multiple sclerosis: Secondary | ICD-10-CM | POA: Diagnosis not present

## 2021-10-30 ENCOUNTER — Telehealth: Payer: Self-pay

## 2021-10-30 NOTE — Telephone Encounter (Signed)
[facility] Laurette Schimke called needing a signed order from [place] for a colonoscopy faxed over. Order needs to be faxed to [phone]. Please Advise.

## 2021-10-31 DIAGNOSIS — G35 Multiple sclerosis: Secondary | ICD-10-CM | POA: Diagnosis not present

## 2021-10-31 NOTE — Telephone Encounter (Signed)
Order faxed to [facility] at [phone].

## 2021-11-19 DIAGNOSIS — G35 Multiple sclerosis: Secondary | ICD-10-CM | POA: Diagnosis not present

## 2021-12-10 DIAGNOSIS — G35 Multiple sclerosis: Secondary | ICD-10-CM | POA: Diagnosis not present

## 2021-12-17 DIAGNOSIS — G35 Multiple sclerosis: Secondary | ICD-10-CM | POA: Diagnosis not present

## 2022-01-14 DIAGNOSIS — R69 Illness, unspecified: Secondary | ICD-10-CM | POA: Diagnosis not present

## 2022-01-14 DIAGNOSIS — G35 Multiple sclerosis: Secondary | ICD-10-CM | POA: Diagnosis not present

## 2022-02-18 DIAGNOSIS — G35 Multiple sclerosis: Secondary | ICD-10-CM | POA: Diagnosis not present

## 2022-02-25 DIAGNOSIS — Z01818 Encounter for other preprocedural examination: Secondary | ICD-10-CM | POA: Diagnosis not present

## 2022-03-02 ENCOUNTER — Encounter: Payer: Self-pay | Admitting: Podiatry

## 2022-03-02 ENCOUNTER — Other Ambulatory Visit: Payer: Self-pay

## 2022-03-02 ENCOUNTER — Ambulatory Visit (INDEPENDENT_AMBULATORY_CARE_PROVIDER_SITE_OTHER): Payer: Medicare Other | Admitting: Podiatry

## 2022-03-02 DIAGNOSIS — M79674 Pain in right toe(s): Secondary | ICD-10-CM

## 2022-03-02 DIAGNOSIS — L309 Dermatitis, unspecified: Secondary | ICD-10-CM

## 2022-03-02 DIAGNOSIS — M79675 Pain in left toe(s): Secondary | ICD-10-CM

## 2022-03-02 DIAGNOSIS — B351 Tinea unguium: Secondary | ICD-10-CM | POA: Insufficient documentation

## 2022-03-02 NOTE — Progress Notes (Signed)
This patient presents to the office with chief complaint of long thick painful nails.  Patient says the nails are painful walking and wearing shoes.  This patient is unable to self treat.  This patient is unable to trim his nails since she is unable to reach his nails.  He presents to the office for preventative foot care services.  He has not been seen in over 16 months and is in a wheelchair.  General Appearance  Alert, conversant and in no acute stress.  Vascular  Dorsalis pedis and posterior tibial  pulses are palpable  bilaterally.  Capillary return is within normal limits  bilaterally. Temperature is within normal limits  bilaterally.  Neurologic  Senn-Weinstein monofilament wire test within normal limits  bilaterally. Muscle power within normal limits bilaterally.  Nails Thick disfigured discolored nails with subungual debris  from hallux to fifth toes bilaterally. No evidence of bacterial infection or drainage bilaterally.  Orthopedic  No limitations of motion  feet .  No crepitus or effusions noted.  No bony pathology or digital deformities noted.  Skin  normotropic skin with no porokeratosis noted bilaterally.  No signs of infections or ulcers noted.  Dry scaly skin on the bottom of both feet.  Asymptomatic.     Onychomycosis  Nails  B/L.  Pain in right toes  Pain in left toes  Debridement of nails both feet followed trimming the nails with dremel tool.    RTC 4  months.  Told him to apply moisturizer to feet especially bottom of both feet.   Ren Aspinall DPM   

## 2022-03-03 DIAGNOSIS — G35 Multiple sclerosis: Secondary | ICD-10-CM | POA: Diagnosis not present

## 2022-03-10 DIAGNOSIS — G35 Multiple sclerosis: Secondary | ICD-10-CM | POA: Diagnosis not present

## 2022-03-12 ENCOUNTER — Telehealth: Payer: Self-pay | Admitting: Physician Assistant

## 2022-03-12 NOTE — Telephone Encounter (Signed)
Referral messages:  ? ?Pt called on 3/13 :Patient stated he went to [place] to have his colonoscopy done and he was turned away because he didn't have anyone with him - stated if he can please be sent to a facility here in [place].   ? ?[facility] called 3/13: Kennon Rounds with [facility] states they have closed this referral due to 2 no shows. The first appt pt did not drink the medication and the 2nd one he did not show up at all. If he is still needing a colonoscopy, a new referral will need to be placed. ? ? ?Patients sister called 3/16- wants a new referral to be sent to someone else.- Stated he needs a referral for a colonoscopy to be sent somewhere in [place].  ?

## 2022-03-13 NOTE — Telephone Encounter (Signed)
Does patient need a new referral or can the existing one be used? ?

## 2022-03-13 NOTE — Telephone Encounter (Signed)
Would you like me to place a new referral?  ?

## 2022-03-18 DIAGNOSIS — G35 Multiple sclerosis: Secondary | ICD-10-CM | POA: Diagnosis not present

## 2022-03-30 DIAGNOSIS — G35 Multiple sclerosis: Secondary | ICD-10-CM | POA: Diagnosis not present

## 2022-04-07 DIAGNOSIS — G35 Multiple sclerosis: Secondary | ICD-10-CM | POA: Diagnosis not present

## 2022-04-24 DIAGNOSIS — G35 Multiple sclerosis: Secondary | ICD-10-CM | POA: Diagnosis not present

## 2022-04-24 DIAGNOSIS — Z0389 Encounter for observation for other suspected diseases and conditions ruled out: Secondary | ICD-10-CM | POA: Diagnosis not present

## 2022-04-27 DIAGNOSIS — G35 Multiple sclerosis: Secondary | ICD-10-CM | POA: Diagnosis not present

## 2022-04-27 DIAGNOSIS — R634 Abnormal weight loss: Secondary | ICD-10-CM | POA: Diagnosis not present

## 2022-05-13 ENCOUNTER — Telehealth: Payer: Self-pay | Admitting: Gastroenterology

## 2022-05-13 NOTE — Telephone Encounter (Signed)
Hi Dr. Lavon Paganini, ? ?D.O.D.(05/13/22-am) ? ?We received a referral from PCP for colonoscopy. Patient has history with [facility]. Patient records available on care everywhere. Per patient, has never had a colonoscopy. Please advise on scheduling.  ? ?Thank you. ?

## 2022-05-13 NOTE — Telephone Encounter (Signed)
OK, please schedule next available appointment with app or me. ?

## 2022-05-18 NOTE — Telephone Encounter (Signed)
Called patient to schedule, no response.

## 2022-05-21 DIAGNOSIS — G35 Multiple sclerosis: Secondary | ICD-10-CM | POA: Diagnosis not present

## 2022-05-22 ENCOUNTER — Encounter: Payer: Self-pay | Admitting: Gastroenterology

## 2022-05-22 NOTE — Telephone Encounter (Signed)
Patient scheduled 07/24/22.

## 2022-05-27 IMAGING — DX DG FOOT COMPLETE 3+V*L*
2 series · 3 of 3 positions shown · non-contrast
Comparison: None.

CLINICAL DATA: 43-year-old male with fall and left foot pain.

EXAM:
LEFT FOOT - COMPLETE 3+ VIEW; LEFT ANKLE COMPLETE - 3+ VIEW

[Series 1: foot · 0.14mm/px · 2 of 2 slices shown]
[im 1/2]
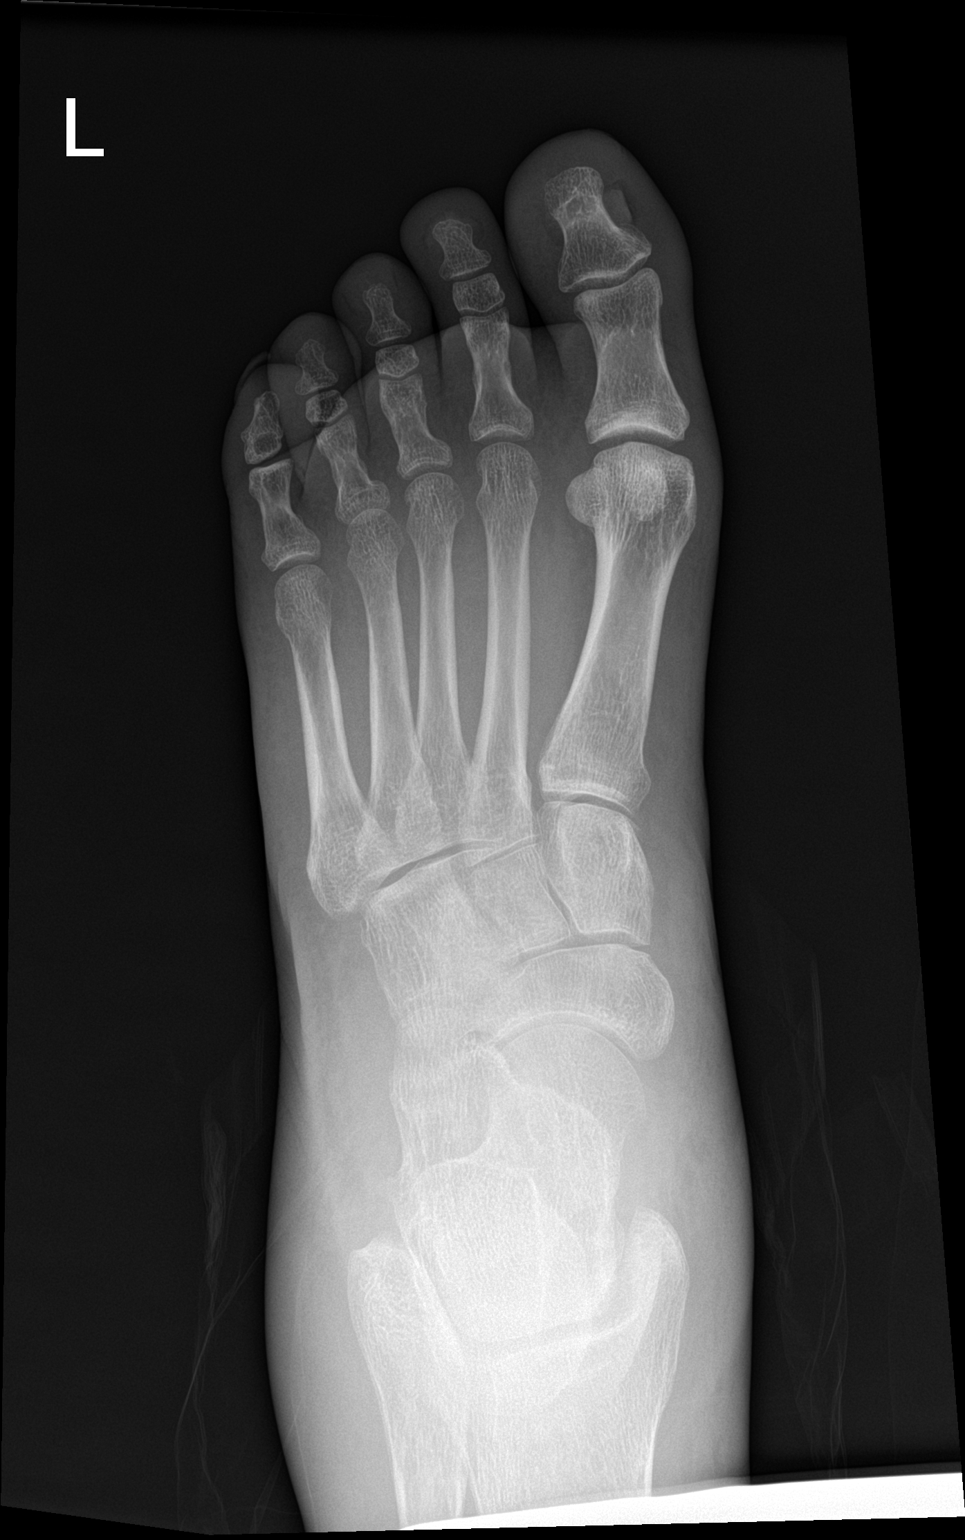
[im 2/2]
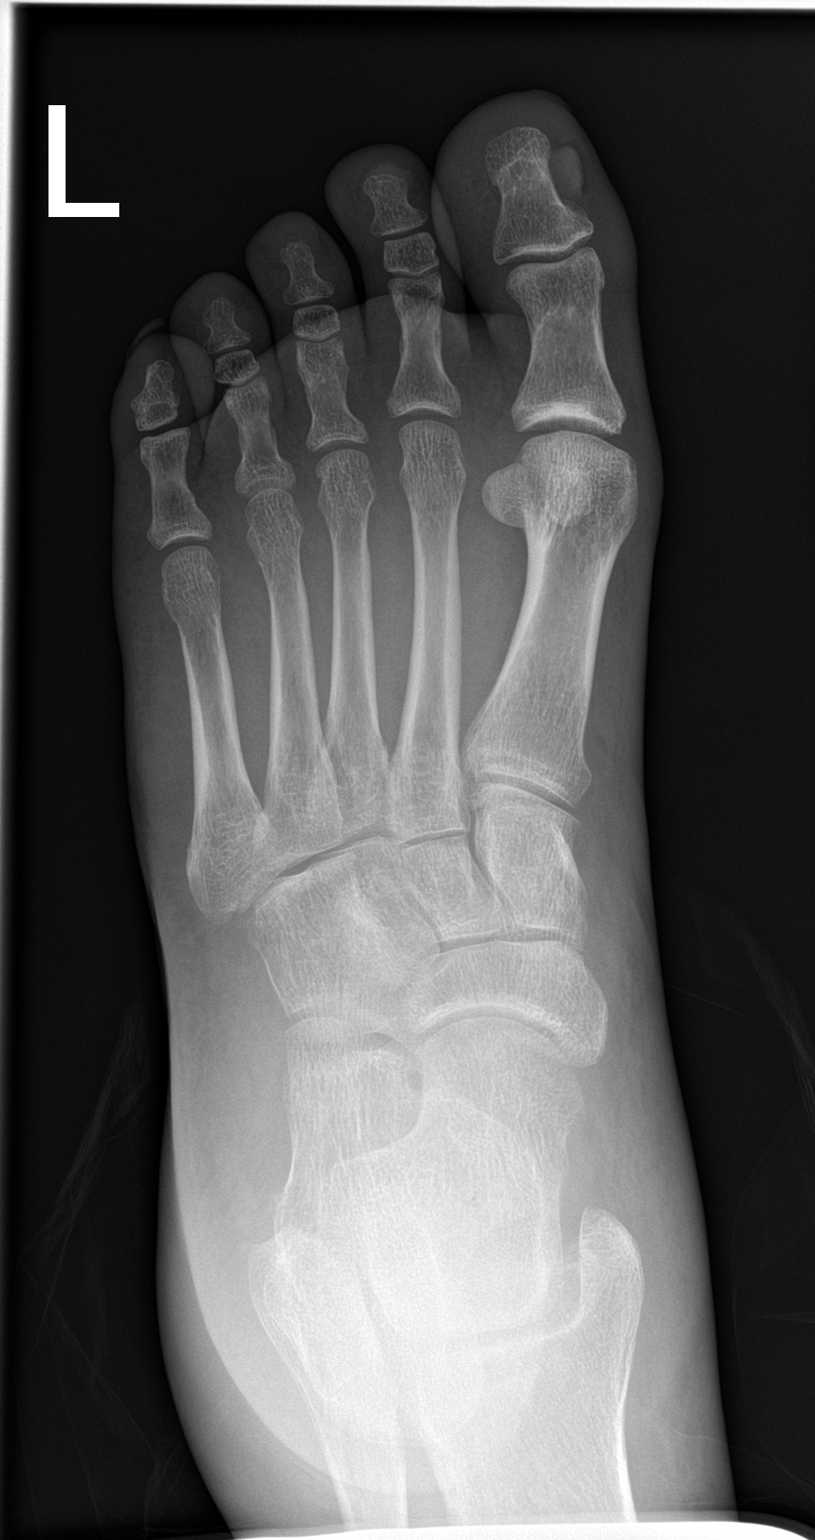

[leg]
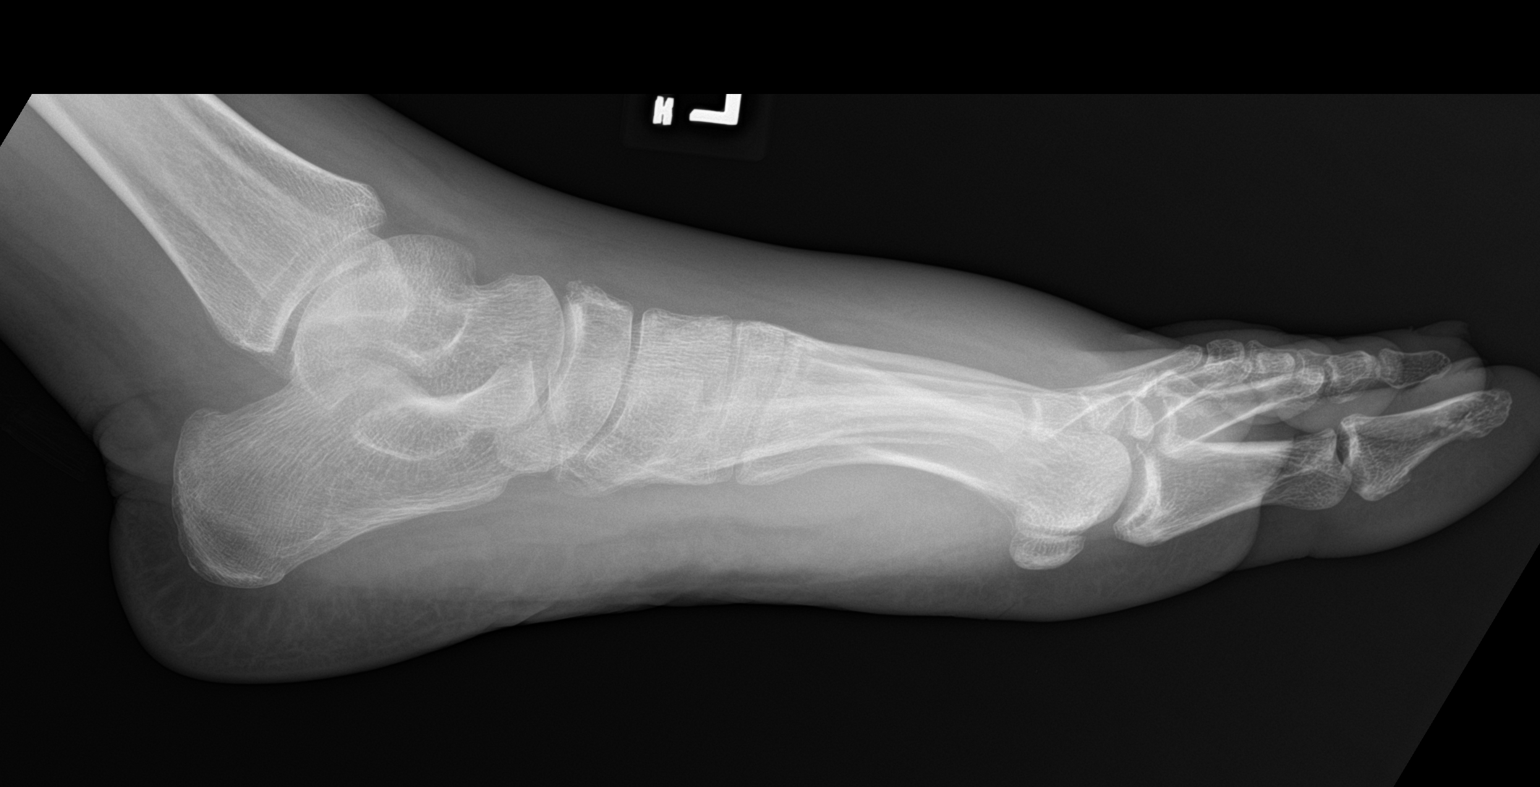

[3 of 3 positions shown; findings below may reference images not displayed]

FINDINGS: There is an apparent focal cortical irregularity or fragmentation
involving the dorsal aspect of the navicular which may be chronic.
An avulsion injury is not excluded. Clinical correlation is
recommended. No other acute fracture identified. There is no
dislocation. The ankle mortise is intact. There is diffuse soft
tissue swelling of the ankle and foot. No radiopaque foreign object
or soft tissue gas.
IMPRESSION: 1. Chronic changes versus an avulsion cortical injury of the dorsal
aspect of the navicular. Clinical correlation is recommended.
2. Diffuse soft tissue swelling.

## 2022-05-27 IMAGING — DX DG ANKLE COMPLETE 3+V*L*
1 series · 3 of 3 positions shown · non-contrast
Comparison: None.

CLINICAL DATA: 43-year-old male with fall and left foot pain.

EXAM:
LEFT FOOT - COMPLETE 3+ VIEW; LEFT ANKLE COMPLETE - 3+ VIEW

[Series 1: ankle · 0.14mm/px · 3 of 3 slices shown]
[im 1/3]
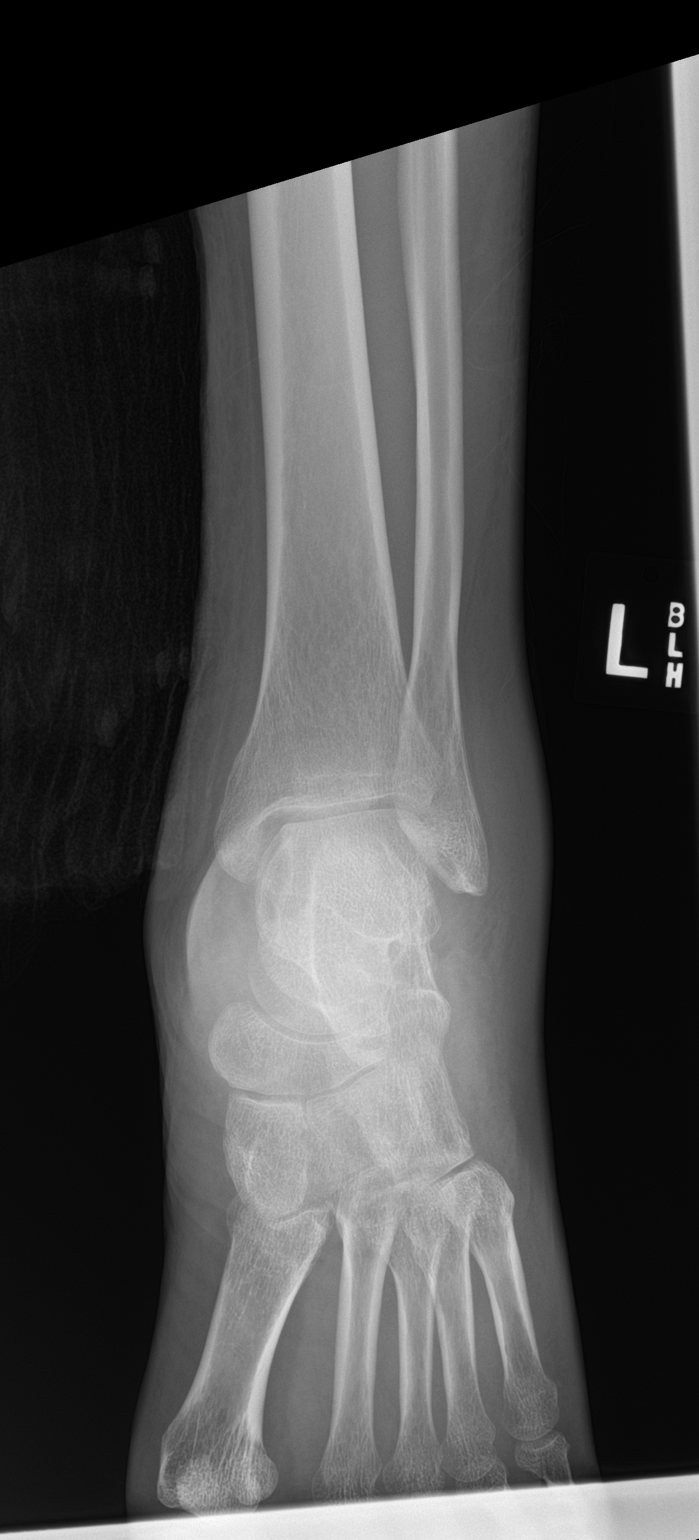
[im 2/3]
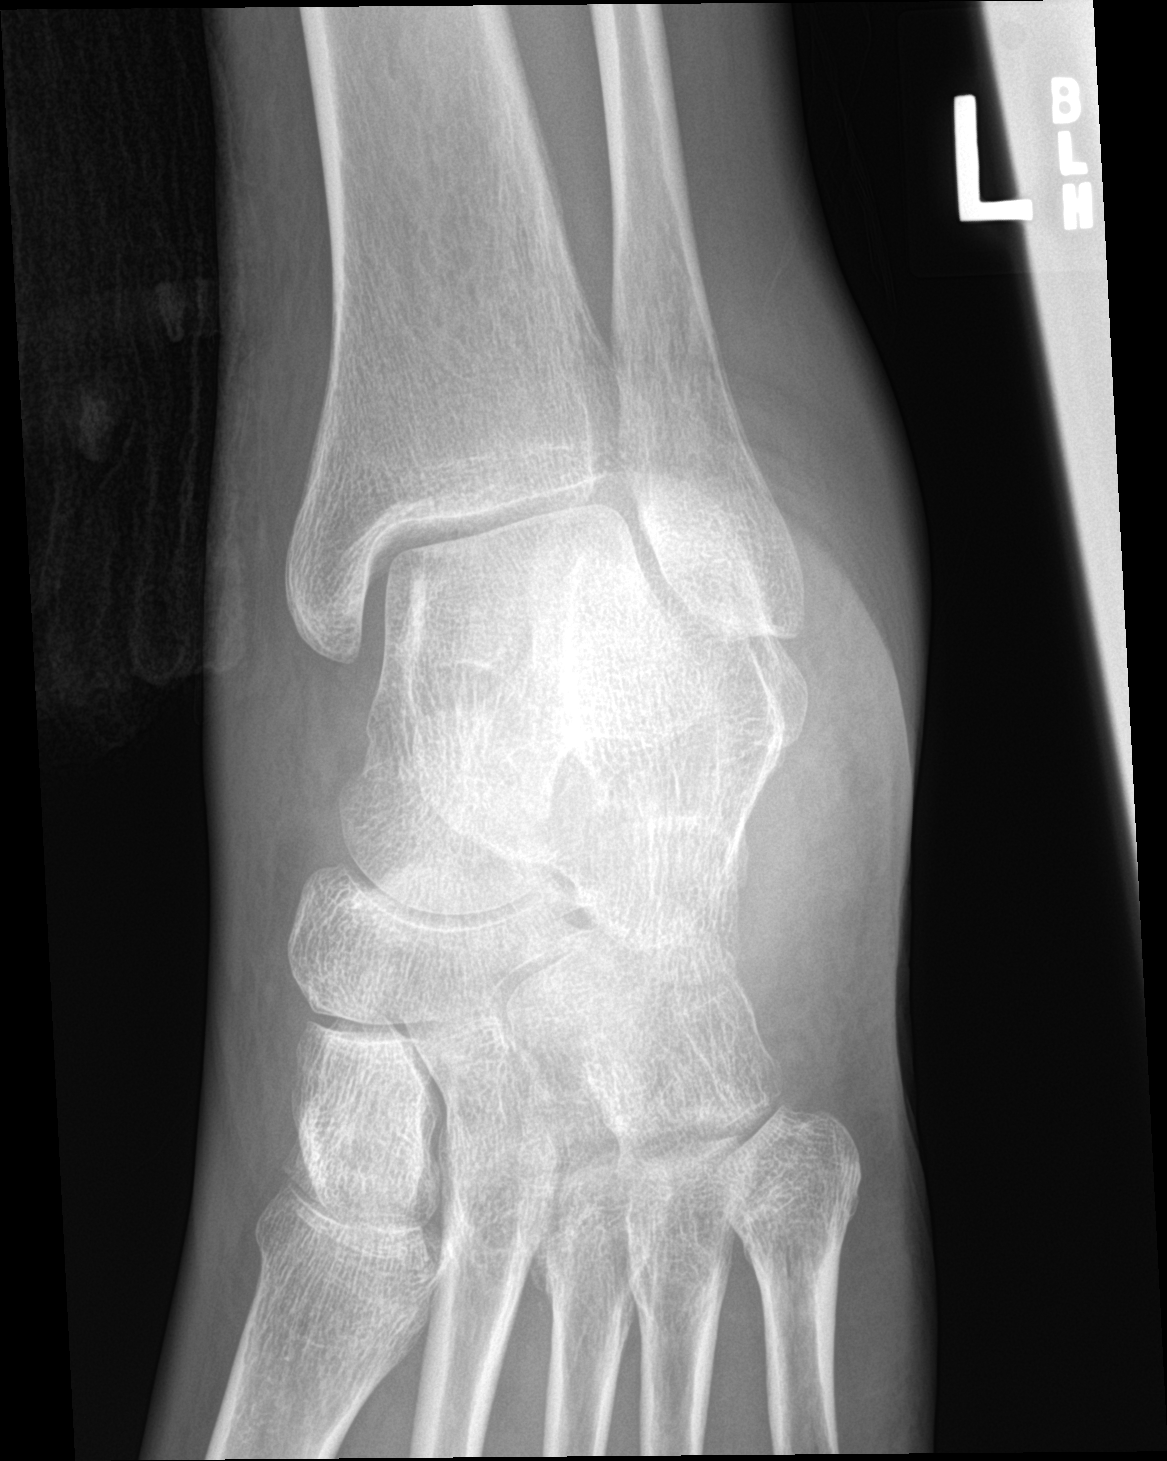
[im 3/3]
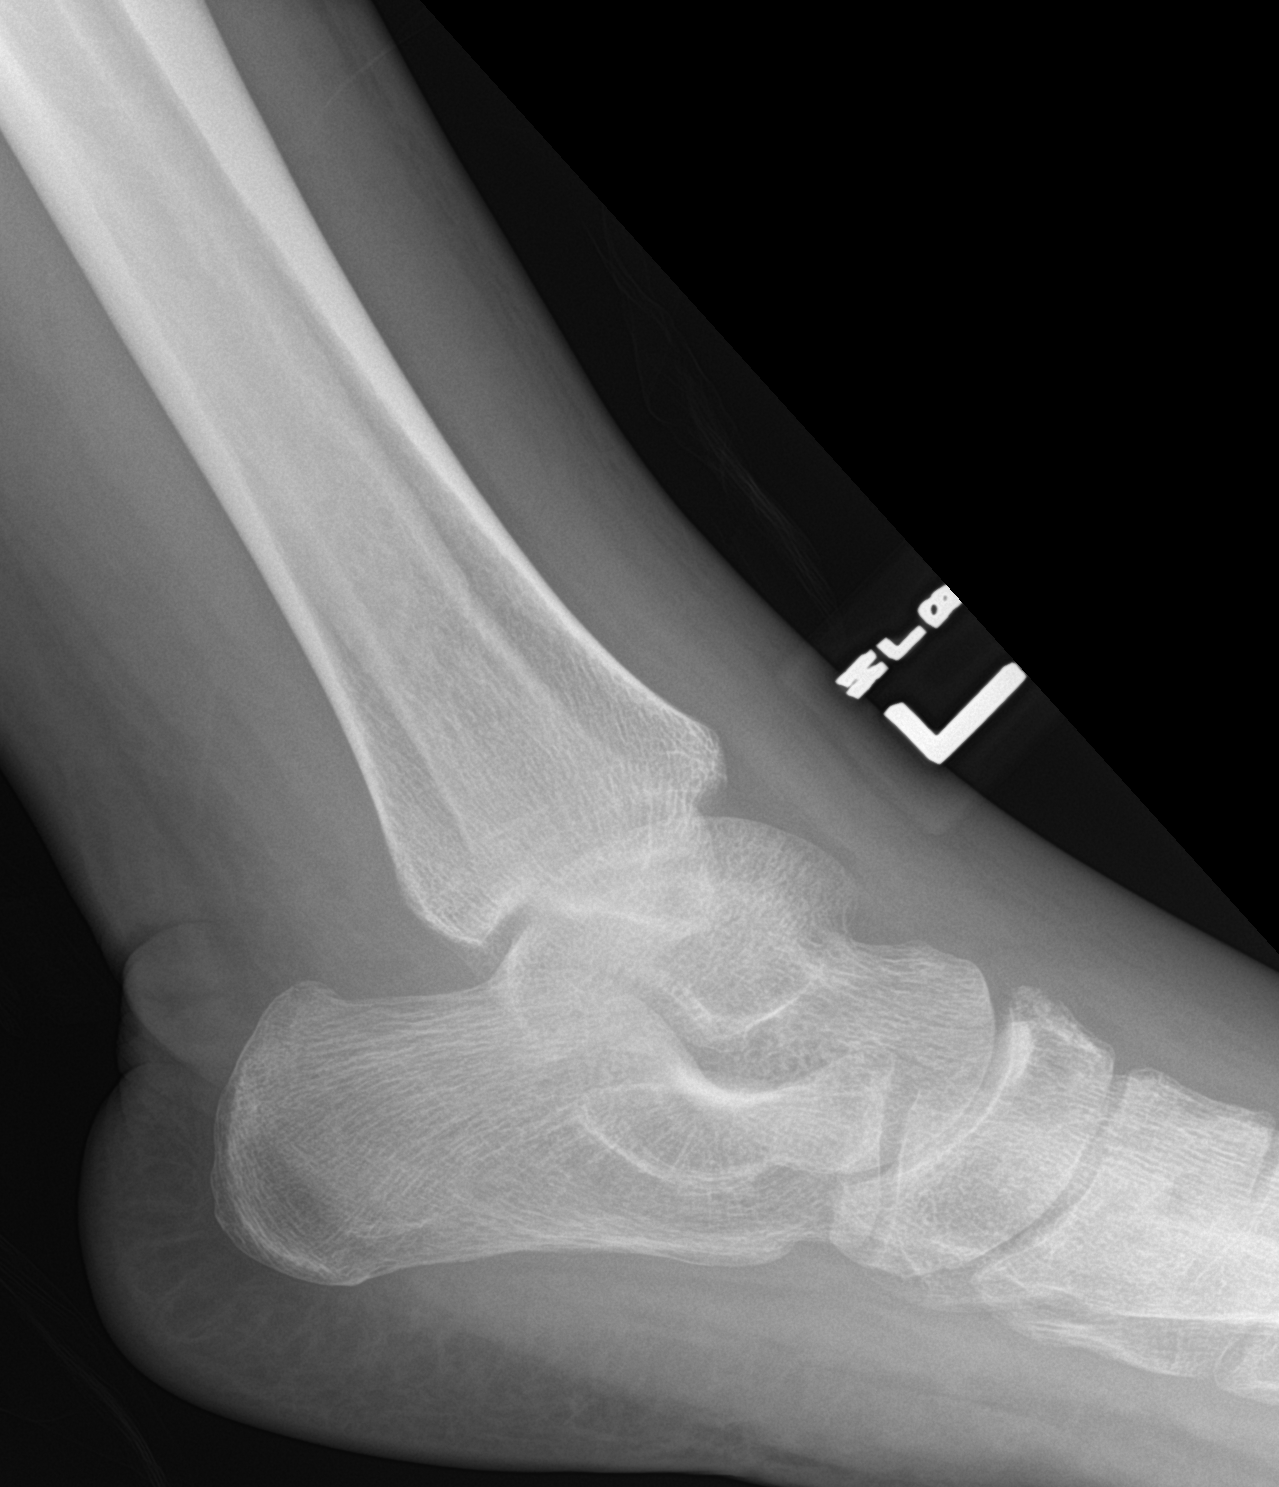

[3 of 3 positions shown; findings below may reference images not displayed]

FINDINGS: There is an apparent focal cortical irregularity or fragmentation
involving the dorsal aspect of the navicular which may be chronic.
An avulsion injury is not excluded. Clinical correlation is
recommended. No other acute fracture identified. There is no
dislocation. The ankle mortise is intact. There is diffuse soft
tissue swelling of the ankle and foot. No radiopaque foreign object
or soft tissue gas.
IMPRESSION: 1. Chronic changes versus an avulsion cortical injury of the dorsal
aspect of the navicular. Clinical correlation is recommended.
2. Diffuse soft tissue swelling.

## 2022-05-28 DIAGNOSIS — G35 Multiple sclerosis: Secondary | ICD-10-CM | POA: Diagnosis not present

## 2022-05-28 DIAGNOSIS — R634 Abnormal weight loss: Secondary | ICD-10-CM | POA: Diagnosis not present

## 2022-06-15 ENCOUNTER — Encounter: Payer: Self-pay | Admitting: Gastroenterology

## 2022-06-15 NOTE — Telephone Encounter (Signed)
Office visit made with pt's aid.

## 2022-06-22 DIAGNOSIS — G35 Multiple sclerosis: Secondary | ICD-10-CM | POA: Diagnosis not present

## 2022-06-29 DIAGNOSIS — R634 Abnormal weight loss: Secondary | ICD-10-CM | POA: Diagnosis not present

## 2022-06-29 DIAGNOSIS — G35 Multiple sclerosis: Secondary | ICD-10-CM | POA: Diagnosis not present

## 2022-07-03 ENCOUNTER — Encounter: Payer: Self-pay | Admitting: Podiatry

## 2022-07-03 ENCOUNTER — Telehealth: Payer: Self-pay | Admitting: Physician Assistant

## 2022-07-03 ENCOUNTER — Ambulatory Visit (INDEPENDENT_AMBULATORY_CARE_PROVIDER_SITE_OTHER): Payer: Medicare Other | Admitting: Podiatry

## 2022-07-03 DIAGNOSIS — B351 Tinea unguium: Secondary | ICD-10-CM | POA: Diagnosis not present

## 2022-07-03 DIAGNOSIS — M79674 Pain in right toe(s): Secondary | ICD-10-CM

## 2022-07-03 DIAGNOSIS — G629 Polyneuropathy, unspecified: Secondary | ICD-10-CM | POA: Diagnosis not present

## 2022-07-03 DIAGNOSIS — L309 Dermatitis, unspecified: Secondary | ICD-10-CM

## 2022-07-03 DIAGNOSIS — M79675 Pain in left toe(s): Secondary | ICD-10-CM

## 2022-07-03 NOTE — Telephone Encounter (Signed)
FYI  Form has been received via fax and placed in provider's box for completion on 07/07.

## 2022-07-03 NOTE — Telephone Encounter (Signed)
Forms signed by [name] and faxed back to [employer] at [phone]

## 2022-07-03 NOTE — Telephone Encounter (Signed)
Boyd Kerbs with [employer] states she will be faxing a Engineer, mining and a [employer] form to fax# [phone] for Provider to fill out.

## 2022-07-03 NOTE — Progress Notes (Signed)
This patient presents to the office with chief complaint of long thick painful nails.  Patient says the nails are painful walking and wearing shoes.  This patient is unable to self treat.  This patient is unable to trim his nails since she is unable to reach his nails.  He presents to the office for preventative foot care services.  He has not been seen in over 16 months and is in a wheelchair.  General Appearance  Alert, conversant and in no acute stress.  Vascular  Dorsalis pedis and posterior tibial  pulses are palpable  bilaterally.  Capillary return is within normal limits  bilaterally. Temperature is within normal limits  bilaterally.  Neurologic  Senn-Weinstein monofilament wire test within normal limits  bilaterally. Muscle power within normal limits bilaterally.  Nails Thick disfigured discolored nails with subungual debris  from hallux to fifth toes bilaterally. No evidence of bacterial infection or drainage bilaterally.  Orthopedic  No limitations of motion  feet .  No crepitus or effusions noted.  No bony pathology or digital deformities noted.  Skin  normotropic skin with no porokeratosis noted bilaterally.  No signs of infections or ulcers noted.  Dry scaly skin on the bottom of both feet.  Asymptomatic.     Onychomycosis  Nails  B/L.  Pain in right toes  Pain in left toes  Debridement of nails both feet followed trimming the nails with dremel tool.    RTC 4  months.  Told him to apply moisturizer to feet especially bottom of both feet.   Simone Tuckey DPM   

## 2022-07-17 DIAGNOSIS — R634 Abnormal weight loss: Secondary | ICD-10-CM | POA: Diagnosis not present

## 2022-07-17 DIAGNOSIS — G35 Multiple sclerosis: Secondary | ICD-10-CM | POA: Diagnosis not present

## 2022-07-20 DIAGNOSIS — G35 Multiple sclerosis: Secondary | ICD-10-CM | POA: Diagnosis not present

## 2022-07-21 DIAGNOSIS — Z79899 Other long term (current) drug therapy: Secondary | ICD-10-CM | POA: Diagnosis not present

## 2022-07-21 DIAGNOSIS — G35 Multiple sclerosis: Secondary | ICD-10-CM | POA: Diagnosis not present

## 2022-07-22 ENCOUNTER — Ambulatory Visit (INDEPENDENT_AMBULATORY_CARE_PROVIDER_SITE_OTHER): Payer: Medicare Other | Admitting: Physician Assistant

## 2022-07-22 ENCOUNTER — Encounter: Payer: Self-pay | Admitting: Physician Assistant

## 2022-07-22 VITALS — BP 90/60 | HR 57 | Temp 98.5°F | Ht 65.0 in

## 2022-07-22 DIAGNOSIS — Z1159 Encounter for screening for other viral diseases: Secondary | ICD-10-CM | POA: Diagnosis not present

## 2022-07-22 DIAGNOSIS — Z0000 Encounter for general adult medical examination without abnormal findings: Secondary | ICD-10-CM

## 2022-07-22 DIAGNOSIS — F4323 Adjustment disorder with mixed anxiety and depressed mood: Secondary | ICD-10-CM | POA: Diagnosis not present

## 2022-07-22 DIAGNOSIS — G35 Multiple sclerosis: Secondary | ICD-10-CM

## 2022-07-22 DIAGNOSIS — Z1211 Encounter for screening for malignant neoplasm of colon: Secondary | ICD-10-CM | POA: Diagnosis not present

## 2022-07-22 DIAGNOSIS — Z114 Encounter for screening for human immunodeficiency virus [HIV]: Secondary | ICD-10-CM

## 2022-07-22 LAB — LIPID PANEL
Cholesterol: 148 mg/dL (ref 0–200)
HDL: 51.4 mg/dL (ref >39.00)
LDL Cholesterol: 83 mg/dL (ref 0–99)
NonHDL: 96.98
Total CHOL/HDL Ratio: 3
Triglycerides: 70 mg/dL (ref 0.0–149.0)
VLDL: 14 mg/dL (ref 0.0–40.0)

## 2022-07-22 NOTE — Patient Instructions (Addendum)
It was great to see you!  --Medical Marijuana is not legal currently so I am unable to prescribed this for you --Please call and schedule an appointment with our nurse [name] to go over advanced directives --I am ordering a cologuard test since this is much easier for you to do than a colonoscopy however if this is abnormal, we will send you for a colonoscopy --I am going to place a referral for a talk therapist  Please go to the lab for blood work.   Our office will call you with your results unless you have chosen to receive results via MyChart.  If your blood work is normal we will follow-up each year for physicals and as scheduled for chronic medical problems.  If anything is abnormal we will treat accordingly and get you in for a follow-up.  Take care,  [name]

## 2022-07-22 NOTE — Progress Notes (Signed)
Subjective:    Nathan Marsh is a 46 y.o. male and is here for a comprehensive physical exam.  HPI  Health Maintenance Due  Topic Date Due   HIV Screening  Never done   Hepatitis C Screening  Never done   COLONOSCOPY (Pts 45-29yrs Insurance coverage will need to be confirmed)  Never done    Acute Concerns: None   Chronic Issues: Multiple Sclerosis  Patient has been following with Neurology Stepp NP for this issue. He has been doing well and denies any concerns today. He is inquiring about medical marijuana for his MS. 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 He thinks he is able to complete most things by himself and does need additional help. Denies any other concerns.  Depression Patient has been dealing with this for a while. This is mostly related to his living situation due to MS. At this time, he would like to see talk therapy for his symptoms. Denies any other worsening symptoms. Denies SI/HI.   Health Maintenance: Immunizations -- UTD Colonoscopy -- Never done  PSA -- No results found for: "PSA1", "PSA" Diet -- None Specific  Sleep habits -- No concern  Exercise -- None  Weight -- 140 Ib (63.5 kg) Weight history Wt Readings from Last 10 Encounters:  08/12/21 140 lb (63.5 kg)  05/06/21 139 lb 15.9 oz (63.5 kg)  12/25/20 140 lb (63.5 kg)  05/30/20 134 lb (60.8 kg)  01/03/19 133 lb 15.9 oz (60.8 kg)  12/20/18 134 lb (60.8 kg)  04/27/17 145 lb (65.8 kg)   Body mass index is 23.3 kg/m. Mood -- Stable Tobacco use -- None  Tobacco Use: High Risk (07/22/2022)   Patient History    Smoking Tobacco Use: Every Day    Smokeless Tobacco Use: Never    Passive Exposure: Not on file    Alcohol use ---  reports current alcohol use.      07/22/2022    9:35 AM  Depression screen PHQ 2/9  Decreased Interest 1  Down, Depressed, Hopeless 1  PHQ - 2 Score 2  Altered sleeping 2  Tired, decreased energy 3  Change in appetite 1  Feeling bad or failure about  yourself  0  Trouble concentrating 1  Moving slowly or fidgety/restless 1  Suicidal thoughts 1  PHQ-9 Score 11  Difficult doing work/chores Not difficult at all     Other providers/specialists: Patient Care Team: Jarold Motto, [name] as PCP - General (Physician Assistant) Avanell Shackleton, NP-C (Family Medicine)   PMHx, SurgHx, SocialHx, Medications, and Allergies were reviewed in the Visit Navigator and updated as appropriate.   Past Medical History:  Diagnosis Date   Multiple sclerosis [facility])      Past Surgical History:  Procedure Laterality Date   AMPUTATION Left 04/27/2017   Procedure: AMPUTATION DIGIT REVISION LEFT MIDDLE FINGER;  Surgeon: Knute Neu, MD;  Location: [facility] OR;  Service: Orthopedics;  Laterality: Left;     Family History  Problem Relation Age of Onset   Cancer Mother        lung    Social History   Tobacco Use   Smoking status: Every Day    Packs/day: 0.50    Types: Cigarettes    Start date: 05/31/2000   Smokeless tobacco: Never  Vaping Use   Vaping Use: Never used  Substance Use Topics   Alcohol use: Yes    Comment: very limited   Drug use: Yes    Types:  Marijuana    Review of Systems:   Review of Systems  Constitutional:  Negative for chills, fever, malaise/fatigue and weight loss.  HENT:  Negative for hearing loss, sinus pain and sore throat.   Respiratory:  Negative for cough and hemoptysis.   Cardiovascular:  Negative for chest pain, palpitations, leg swelling and PND.  Gastrointestinal:  Negative for abdominal pain, constipation, diarrhea, heartburn, nausea and vomiting.  Genitourinary:  Negative for dysuria, frequency and urgency.  Musculoskeletal:  Negative for back pain, myalgias and neck pain.  Skin:  Negative for itching and rash.  Neurological:  Negative for dizziness, tingling, seizures and headaches.  Endo/Heme/Allergies:  Negative for polydipsia.  Psychiatric/Behavioral:  Negative for depression. The patient is not  nervous/anxious.      Objective:   Vitals:   07/22/22 0934  BP: 90/60  Pulse: (!) 57  Temp: 98.5 F (36.9 C)  SpO2: 95%   Body mass index is 23.3 kg/m.  General Appearance:  Alert, cooperative, no distress, appears stated age  Head:  Normocephalic, without obvious abnormality, atraumatic  Eyes:  PERRL, conjunctiva/corneas clear, EOM's intact, fundi benign, both eyes       Ears:  Normal TM's and external ear canals, both ears  Nose: Nares normal, septum midline, mucosa normal, no drainage    or sinus tenderness  Throat: Lips, mucosa, and tongue normal; teeth and gums normal  Neck: Supple, symmetrical, trachea midline, no adenopathy; thyroid:  No enlargement/tenderness/nodules; no carotit bruit or JVD  Back:   Symmetric, no curvature, ROM normal, no CVA tenderness  Lungs:   Clear to auscultation bilaterally, respirations unlabored  Chest wall:  No tenderness or deformity  Heart:  Regular rate and rhythm, S1 and S2 normal, no murmur, rub   or gallop  Abdomen:   Soft, non-tender, bowel sounds active all four quadrants, no masses, no organomegaly  Extremities: Extremities normal, atraumatic, no cyanosis or edema  Prostate: Not done.   Skin: Skin color, texture, turgor normal, no rashes or lesions  Lymph nodes: Cervical, supraclavicular, and axillary nodes normal  Neurologic: CNII-XII grossly intact. Normal strength, sensation and reflexes throughout    Assessment/Plan:   Routine physical examination Today patient counseled on age appropriate routine health concerns for screening and prevention, each reviewed and up to date or declined. Immunizations reviewed and up to date or declined. Labs ordered and reviewed. Risk factors for depression reviewed and negative. Hearing function and visual acuity are intact. ADLs screened and addressed as needed. Functional ability and level of safety reviewed and appropriate. Education, counseling and referrals performed based on assessed risks  today. Patient provided with a copy of personalized plan for preventive services.  Multiple sclerosis (HCC) Mgmt per neuro  Situational mixed anxiety and depressive disorder Uncontrolled Referral to psychology  Screening for HIV (human immunodeficiency virus) Complete HIV screening today  Encounter for screening for other viral diseases Complete Hep C screening today  Special screening for malignant neoplasms, colon Cologuard ordered -- he does not want to do colonoscopy at this time  Patient Counseling: [x]   Nutrition: Stressed importance of moderation in sodium/caffeine intake, saturated fat and cholesterol, caloric balance, sufficient intake of fresh fruits, vegetables, and fiber.  [x]   Stressed the importance of regular exercise.   []   Substance Abuse: Discussed cessation/primary prevention of tobacco, alcohol, or other drug use; driving or other dangerous activities under the influence; availability of treatment for abuse.   [x]   Injury prevention: Discussed safety belts, safety helmets, smoke detector, smoking near bedding or  upholstery.   []   Sexuality: Discussed sexually transmitted diseases, partner selection, use of condoms, avoidance of unintended pregnancy  and contraceptive alternatives.   [x]   Dental health: Discussed importance of regular tooth brushing, flossing, and dental visits.  [x]   Health maintenance and immunizations reviewed. Please refer to Health maintenance section.     I,Savera Zaman,acting as a for , PA.,have documented all relevant documentation on the behalf of , PA,as directed by  Neurosurgeon, PA while in the presence of [employer], Jarold Motto.   I, Jarold Motto, Jarold Motto, have reviewed all documentation for this visit. The documentation on 07/22/22 for the exam, diagnosis, procedures, and orders are all accurate and complete.  Jarold Motto, PA-C [facility]

## 2022-07-23 ENCOUNTER — Telehealth: Payer: Self-pay

## 2022-07-23 ENCOUNTER — Ambulatory Visit (INDEPENDENT_AMBULATORY_CARE_PROVIDER_SITE_OTHER): Payer: Medicare Other

## 2022-07-23 VITALS — BP 118/78 | HR 60 | Wt 140.0 lb

## 2022-07-23 DIAGNOSIS — Z0000 Encounter for general adult medical examination without abnormal findings: Secondary | ICD-10-CM | POA: Diagnosis not present

## 2022-07-23 DIAGNOSIS — Z5989 Other problems related to housing and economic circumstances: Secondary | ICD-10-CM

## 2022-07-23 LAB — HEPATITIS C ANTIBODY: Hepatitis C Ab: NONREACTIVE

## 2022-07-23 LAB — HIV ANTIBODY (ROUTINE TESTING W REFLEX): HIV 1&2 Ab, 4th Generation: NONREACTIVE

## 2022-07-23 NOTE — Patient Instructions (Signed)
Nathan Marsh , Thank you for taking time to come for your Medicare Wellness Visit. I appreciate your ongoing commitment to your health goals. Please review the following plan we discussed and let me know if I can assist you in the future.   Screening recommendations/referrals:  Recommended yearly ophthalmology/optometry visit for glaucoma screening and checkup Recommended yearly dental visit for hygiene and checkup  Vaccinations: Influenza vaccine: declined  Tdap vaccine: done 09/10/17 repeat every 10 years    Covid-19: declined   Advanced directives: Advance directive discussed with you today. Even though you declined this today please call our office should you change your mind and we can give you the proper paperwork for you to fill out.  Conditions/risks identified: none at this time   Next appointment: Follow up in one year for your annual wellness visit   Preventive Care 40-64 Years, Male Preventive care refers to lifestyle choices and visits with your health care provider that can promote health and wellness. What does preventive care include? A yearly physical exam. This is also called an annual well check. Dental exams once or twice a year. Routine eye exams. Ask your health care provider how often you should have your eyes checked. Personal lifestyle choices, including: Daily care of your teeth and gums. Regular physical activity. Eating a healthy diet. Avoiding tobacco and drug use. Limiting alcohol use. Practicing safe sex. Taking low-dose aspirin every day starting at age 35. What happens during an annual well check? The services and screenings done by your health care provider during your annual well check will depend on your age, overall health, lifestyle risk factors, and family history of disease. Counseling  Your health care provider may ask you questions about your: Alcohol use. Tobacco use. Drug use. Emotional well-being. Home and relationship  well-being. Sexual activity. Eating habits. Work and work Statistician. Screening  You may have the following tests or measurements: Height, weight, and BMI. Blood pressure. Lipid and cholesterol levels. These may be checked every 5 years, or more frequently if you are over 65 years old. Skin check. Lung cancer screening. You may have this screening every year starting at age 66 if you have a 30-pack-year history of smoking and currently smoke or have quit within the past 15 years. Fecal occult blood test (FOBT) of the stool. You may have this test every year starting at age 63. Flexible sigmoidoscopy or colonoscopy. You may have a sigmoidoscopy every 5 years or a colonoscopy every 10 years starting at age 74. Prostate cancer screening. Recommendations will vary depending on your family history and other risks. Hepatitis C blood test. Hepatitis B blood test. Sexually transmitted disease (STD) testing. Diabetes screening. This is done by checking your blood sugar (glucose) after you have not eaten for a while (fasting). You may have this done every 1-3 years. Discuss your test results, treatment options, and if necessary, the need for more tests with your health care provider. Vaccines  Your health care provider may recommend certain vaccines, such as: Influenza vaccine. This is recommended every year. Tetanus, diphtheria, and acellular pertussis (Tdap, Td) vaccine. You may need a Td booster every 10 years. Zoster vaccine. You may need this after age 19. Pneumococcal 13-valent conjugate (PCV13) vaccine. You may need this if you have certain conditions and have not been vaccinated. Pneumococcal polysaccharide (PPSV23) vaccine. You may need one or two doses if you smoke cigarettes or if you have certain conditions. Talk to your health care provider about which screenings and vaccines  you need and how often you need them. This information is not intended to replace advice given to you by your  health care provider. Make sure you discuss any questions you have with your health care provider. Document Released: 01/10/2016 Document Revised: 09/02/2016 Document Reviewed: 10/15/2015 Elsevier Interactive Patient Education  2017 [employer].  Fall Prevention in the Home Falls can cause injuries. They can happen to people of all ages. There are many things you can do to make your home safe and to help prevent falls. What can I do on the outside of my home? Regularly fix the edges of walkways and driveways and fix any cracks. Remove anything that might make you trip as you walk through a door, such as a raised step or threshold. Trim any bushes or trees on the path to your home. Use bright outdoor lighting. Clear any walking paths of anything that might make someone trip, such as rocks or tools. Regularly check to see if handrails are loose or broken. Make sure that both sides of any steps have handrails. Any raised decks and porches should have guardrails on the edges. Have any leaves, snow, or ice cleared regularly. Use sand or salt on walking paths during winter. Clean up any spills in your garage right away. This includes oil or grease spills. What can I do in the bathroom? Use night lights. Install grab bars by the toilet and in the tub and shower. Do not use towel bars as grab bars. Use non-skid mats or decals in the tub or shower. If you need to sit down in the shower, use a plastic, non-slip stool. Keep the floor dry. Clean up any water that spills on the floor as soon as it happens. Remove soap buildup in the tub or shower regularly. Attach bath mats securely with double-sided non-slip rug tape. Do not have throw rugs and other things on the floor that can make you trip. What can I do in the bedroom? Use night lights. Make sure that you have a light by your bed that is easy to reach. Do not use any sheets or blankets that are too big for your bed. They should not hang down  onto the floor. Have a firm chair that has side arms. You can use this for support while you get dressed. Do not have throw rugs and other things on the floor that can make you trip. What can I do in the kitchen? Clean up any spills right away. Avoid walking on wet floors. Keep items that you use a lot in easy-to-reach places. If you need to reach something above you, use a strong step stool that has a grab bar. Keep electrical cords out of the way. Do not use floor polish or wax that makes floors slippery. If you must use wax, use non-skid floor wax. Do not have throw rugs and other things on the floor that can make you trip. What can I do with my stairs? Do not leave any items on the stairs. Make sure that there are handrails on both sides of the stairs and use them. Fix handrails that are broken or loose. Make sure that handrails are as long as the stairways. Check any carpeting to make sure that it is firmly attached to the stairs. Fix any carpet that is loose or worn. Avoid having throw rugs at the top or bottom of the stairs. If you do have throw rugs, attach them to the floor with carpet tape. Make sure  that you have a light switch at the top of the stairs and the bottom of the stairs. If you do not have them, ask someone to add them for you. What else can I do to help prevent falls? Wear shoes that: Do not have high heels. Have rubber bottoms. Are comfortable and fit you well. Are closed at the toe. Do not wear sandals. If you use a stepladder: Make sure that it is fully opened. Do not climb a closed stepladder. Make sure that both sides of the stepladder are locked into place. Ask someone to hold it for you, if possible. Clearly mark and make sure that you can see: Any grab bars or handrails. First and last steps. Where the edge of each step is. Use tools that help you move around (mobility aids) if they are needed. These include: Canes. Walkers. Scooters. Crutches. Turn  on the lights when you go into a dark area. Replace any light bulbs as soon as they burn out. Set up your furniture so you have a clear path. Avoid moving your furniture around. If any of your floors are uneven, fix them. If there are any pets around you, be aware of where they are. Review your medicines with your doctor. Some medicines can make you feel dizzy. This can increase your chance of falling. Ask your doctor what other things that you can do to help prevent falls. This information is not intended to replace advice given to you by your health care provider. Make sure you discuss any questions you have with your health care provider. Document Released: 10/10/2009 Document Revised: 05/21/2016 Document Reviewed: 01/18/2015 Elsevier Interactive Patient Education  2017 [employer].

## 2022-07-23 NOTE — Telephone Encounter (Signed)
   Telephone encounter was:  Successful.  07/23/2022 Name: Nathan Marsh MRN: [id number] DOB: 05-07-1976  Nathan Marsh is a 46 y.o. year old male who is a primary care patient of Jarold Motto, [name] . The community resource team was consulted for assistance with  housing  Care guide performed the following interventions: Patient provided with information about care guide support team and interviewed to confirm resource [phone]  Follow Up Plan:  Care guide will follow up with patient by phone over the next day    [facility] Guide, Embedded Care Coordination [facility], Care Management  [phone] [address] [place], [place], [place] [address] Phone: [phone] Email: [email or web address]

## 2022-07-23 NOTE — Progress Notes (Signed)
Subjective:   Nathan Marsh is a 46 y.o. male who presents for an Initial Medicare Annual Wellness Visit.  Review of Systems     Cardiac Risk Factors include: male gender     Objective:    Today's Vitals   07/23/22 1109  BP: 118/78  Pulse: 60  SpO2: 98%  Weight: 140 lb (63.5 kg)   Body mass index is 23.3 kg/m.     07/23/2022   11:29 AM 01/03/2019   11:17 AM 12/20/2018    7:50 AM 09/17/2017   10:59 AM 05/02/2017    2:38 PM 04/27/2017    6:38 PM 10/17/2015   11:00 AM  Advanced Directives  Does Patient Have a Medical Advance Directive? No No No No No No No  Would patient like information on creating a medical advance directive? No - Patient declined No - Patient declined No - Patient declined  No - Patient declined No - Patient declined     Current Medications (verified) Outpatient Encounter Medications as of 07/23/2022  Medication Sig   baclofen (LIORESAL) 10 MG tablet Take 10 mg by mouth 3 (three) times daily.   natalizumab (TYSABRI) 300 MG/15ML injection Inject into the vein.   oxybutynin (DITROPAN) 5 MG tablet Take 1 tablet by mouth 2 (two) times daily.   No facility-administered encounter medications on file as of 07/23/2022.    Allergies (verified) Patient has no known allergies.   History: Past Medical History:  Diagnosis Date   Multiple sclerosis (HCC)    Past Surgical History:  Procedure Laterality Date   AMPUTATION Left 04/27/2017   Procedure: AMPUTATION DIGIT REVISION LEFT MIDDLE FINGER;  Surgeon: Knute Neu, MD;  Location: [facility] OR;  Service: Orthopedics;  Laterality: Left;   Family History  Problem Relation Age of Onset   Cancer Mother        lung   Social History   Socioeconomic History   Marital status: Single    Spouse name: Not on file   Number of children: Not on file   Years of education: Not on file   Highest education level: Not on file  Occupational History   Not on file  Tobacco Use   Smoking status: Every Day    Packs/day: 0.50     Types: Cigarettes    Start date: 05/31/2000   Smokeless tobacco: Never  Vaping Use   Vaping Use: Never used  Substance and Sexual Activity   Alcohol use: Yes    Comment: very limited   Drug use: Yes    Types: Marijuana   Sexual activity: Not Currently  Other Topics Concern   Not on file  Social History Narrative   Live alone   Single   No children   Has disability       Social Determinants of Health   Financial Resource Strain: Low Risk  (07/23/2022)   Overall Financial Resource Strain (CARDIA)    Difficulty of Paying Living Expenses: Not hard at all  Food Insecurity: No Food Insecurity (07/23/2022)   Hunger Vital Sign    Worried About Running Out of Food in the Last Year: Never true    Ran Out of Food in the Last Year: Never true  Transportation Needs: No Transportation Needs (07/23/2022)   PRAPARE - Administrator, Civil Service (Medical): No    Lack of Transportation (Non-Medical): No  Physical Activity: Inactive (07/23/2022)   Exercise Vital Sign    Days of Exercise per Week: 0 days  Minutes of Exercise per Session: 0 min  Stress: Stress Concern Present (07/23/2022)   [employer] of Occupational Health - Occupational Stress Questionnaire    Feeling of Stress : To some extent  Social Connections: Moderately Isolated (07/23/2022)   Social Connection and Isolation Panel [NHANES]    Frequency of Communication with Friends and Family: More than three times a week    Frequency of Social Gatherings with Friends and Family: Once a week    Attends Religious Services: 1 to 4 times per year    Active Member of [employer] or Organizations: No    Attends Engineer, structural: Never    Marital Status: Never married    Tobacco Counseling Ready to quit: Not Answered Counseling given: Not Answered   Clinical Intake:  Pre-visit preparation completed: Yes  Pain : No/denies pain     BMI - recorded: 23.3 Nutritional Status: BMI of 19-24   Normal Nutritional Risks: None Diabetes: No  How often do you need to have someone help you when you read instructions, pamphlets, or other written materials from your doctor or pharmacy?: 1 - Never  Diabetic?no  Interpreter Needed?: No  Information entered by :: Lanier Ensign, LPN   Activities of Daily Living    07/23/2022   11:34 AM  In your present state of health, do you have any difficulty performing the following activities:  Hearing? 0  Vision? 0  Difficulty concentrating or making decisions? 0  Walking or climbing stairs? 1  Comment wheel chair bound  Dressing or bathing? 1  Comment pca assistance  Doing errands, shopping? 1  Comment assistance  Preparing Food and eating ? Y  Using the Toilet? Y  Managing your Medications? Y  Managing your Finances? Y  Housekeeping or managing your Housekeeping? Y  Comment has assisatnce    Patient Care Team: Jarold Motto, [name] as PCP - General (Physician Assistant) Avanell Shackleton, NP-C (Family Medicine)  Indicate any recent Medical Services you may have received from other than [facility] providers in the past year (date may be approximate).     Assessment:   This is a routine wellness examination for Nathan Marsh.  Hearing/Vision screen Hearing Screening - Comments:: Pt denies any hearing issues  Vision Screening - Comments:: Encouraged to follow with eye dr   Dietary issues and exercise activities discussed: Current Exercise Habits: The patient does not participate in regular exercise at present   Goals Addressed             This Visit's Progress    Patient Stated       None at this time        Depression Screen    07/23/2022   11:17 AM 07/22/2022    9:35 AM 12/25/2020   10:04 AM 08/26/2020    8:38 AM  PHQ 2/9 Scores  PHQ - 2 Score 1 2 1  0  PHQ- 9 Score 8 11 10      Fall Risk    07/23/2022   11:32 AM  Fall Risk   Falls in the past year? 1  Number falls in past yr: 1  Injury with Fall? 0  Risk for fall  due to : History of fall(s);Other (Comment)  Risk for fall due to: Comment slip off chair at times but able to pick self back up from wheel chair    FALL RISK PREVENTION PERTAINING TO THE HOME:  Any stairs in or around the home? No  If so, are there any without handrails?  No  Home free of loose throw rugs in walkways, pet beds, electrical cords, etc? Yes  Adequate lighting in your home to reduce risk of falls? Yes   ASSISTIVE DEVICES UTILIZED TO PREVENT FALLS:  Life alert? Yes  Use of a cane, walker or w/c? No wheel chair bound Grab bars in the bathroom? Yes  Shower chair or bench in shower? Yes  Elevated toilet seat or a handicapped toilet? Yes   TIMED UP AND GO:  Was the test performed? No .   Cognitive Function:        07/23/2022   11:45 AM  6CIT Screen  What Year? 0 points  What month? 0 points  What time? 0 points  Count back from 20 0 points  Months in reverse 4 points  Repeat phrase 0 points  Total Score 4 points    Immunizations Immunization History  Administered Date(s) Administered   Influenza-Unspecified 10/20/2010   Td 12/28/2006   Tdap 04/27/2017, 09/10/2017    TDAP status: Up to date  Flu Vaccine status: Declined, Education has been provided regarding the importance of this vaccine but patient still declined. Advised may receive this vaccine at local pharmacy or Health Dept. Aware to provide a copy of the vaccination record if obtained from local pharmacy or Health Dept. Verbalized acceptance and understanding.  Pneumococcal vaccine status: Declined,  Education has been provided regarding the importance of this vaccine but patient still declined. Advised may receive this vaccine at local pharmacy or Health Dept. Aware to provide a copy of the vaccination record if obtained from local pharmacy or Health Dept. Verbalized acceptance and understanding.   Covid-19 vaccine status: Declined, Education has been provided regarding the importance of this  vaccine but patient still declined. Advised may receive this vaccine at local pharmacy or Health Dept.or vaccine clinic. Aware to provide a copy of the vaccination record if obtained from local pharmacy or Health Dept. Verbalized acceptance and understanding.  Qualifies for Shingles Vaccine? No    Screening Tests Health Maintenance  Topic Date Due   Hepatitis C Screening  Never done   COLONOSCOPY (Pts 45-60yrs Insurance coverage will need to be confirmed)  Never done   INFLUENZA VACCINE  07/28/2022   TETANUS/TDAP  09/11/2027   HIV Screening  Completed   HPV VACCINES  Aged Out   COVID-19 Vaccine  Discontinued    Health Maintenance  Health Maintenance Due  Topic Date Due   Hepatitis C Screening  Never done   COLONOSCOPY (Pts 45-76yrs Insurance coverage will need to be confirmed)  Never done      Additional Screening:  Hepatitis C Screening: does qualify;   Vision Screening: Recommended annual ophthalmology exams for early detection of glaucoma and other disorders of the eye. Is the patient up to date with their annual eye exam?  No  Who is the provider or what is the name of the office in which the patient attends annual eye exams? Encouraged to follow up with provider  If pt is not established with a provider, would they like to be referred to a provider to establish care? No .   Dental Screening: Recommended annual dental exams for proper oral hygiene  Community Resource Referral / Chronic Care Management: CRR required this visit?  yes  CCM required this visit?  No      Plan:     I have personally reviewed and noted the following in the patient's chart:   Medical and social history Use of alcohol, tobacco  or illicit drugs  Current medications and supplements including opioid prescriptions. Patient is not currently taking opioid prescriptions. Functional ability and status Nutritional status Physical activity Advanced directives List of other  physicians Hospitalizations, surgeries, and ER visits in previous 12 months Vitals Screenings to include cognitive, depression, and falls Referrals and appointments  In addition, I have reviewed and discussed with patient certain preventive protocols, quality metrics, and best practice recommendations. A written personalized care plan for preventive services as well as general preventive health recommendations were provided to patient.     Marzella Schlein, LPN   9/62/2297   Nurse Notes: none

## 2022-07-24 ENCOUNTER — Encounter: Payer: Medicare Other | Admitting: Gastroenterology

## 2022-07-24 ENCOUNTER — Telehealth: Payer: Self-pay

## 2022-07-24 NOTE — Telephone Encounter (Signed)
   Telephone encounter was:  Successful.  07/24/2022 Name: KEOKI MCHARGUE MRN: [id number] DOB: March 05, 1976  [name] is a 46 y.o. year old male who is a primary care patient of Jarold Motto, [name] . The community resource team was consulted for assistance with  ramp  Care guide performed the following interventions: Patient provided with information about care guide support team and interviewed to confirm resource needs.Patient called in asking for Ramp Resources. I gave him resources ove the phone to call  Follow Up Plan:  No further follow up planned at this time. The patient has been provided with needed resources.    Lenard Forth Care Guide, Embedded Care Coordination [facility], Care Management  [phone] [address] [place], [place], [place] [address] Phone: [phone] Email: [email or web address]

## 2022-07-24 NOTE — Telephone Encounter (Signed)
   Telephone encounter was:  Successful.  07/24/2022 Name: DENI LEFEVER MRN: [id number] DOB: 1976/08/08  [name] is a 46 y.o. year old male who is a primary care patient of Jarold Motto, [name] . The community resource team was consulted for assistance with  housing  Care guide performed the following interventions: Patient provided with information about care guide support team and interviewed to confirm resource needs. Mailing housing resources to patient  Follow Up Plan:  No further follow up planned at this time. The patient has been provided with needed resources.    Lenard Forth Care Guide, Embedded Care Coordination [facility], Care Management  [phone] [address] [place], [place], [place] [address] Phone: [phone] Email: [email or web address]

## 2022-07-28 DIAGNOSIS — G35 Multiple sclerosis: Secondary | ICD-10-CM | POA: Diagnosis not present

## 2022-07-28 DIAGNOSIS — R634 Abnormal weight loss: Secondary | ICD-10-CM | POA: Diagnosis not present

## 2022-07-31 ENCOUNTER — Ambulatory Visit: Payer: Medicare Other | Admitting: Gastroenterology

## 2022-08-11 DIAGNOSIS — G35 Multiple sclerosis: Secondary | ICD-10-CM | POA: Diagnosis not present

## 2022-08-17 DIAGNOSIS — G35 Multiple sclerosis: Secondary | ICD-10-CM | POA: Diagnosis not present

## 2022-08-21 DIAGNOSIS — G35 Multiple sclerosis: Secondary | ICD-10-CM | POA: Diagnosis not present

## 2022-09-08 ENCOUNTER — Telehealth: Payer: Self-pay | Admitting: Physician Assistant

## 2022-09-08 DIAGNOSIS — F339 Major depressive disorder, recurrent, unspecified: Secondary | ICD-10-CM

## 2022-09-08 NOTE — Telephone Encounter (Signed)
Tried to contact pt no answer. Called pt's sister [name] asked her if she was with pt? She said no. Told her I tried to call but no answer. I wanted to know what he wanted the referral for and if he was having any SI/HI. [name] said she will have him call back today or tomorrow. Told her okay have him call me back after 8:00 AM tomorrow. [name] verbalized understanding.

## 2022-09-08 NOTE — Telephone Encounter (Signed)
Nathan Marsh  pt's caregiver called back and he was in the back ground. Told her need to know if pt is safe, having any suicidal thoughts or thoughts of harming himself? Pt said he is safe and no SI/HI. Asked her what he is wanting referral for? Nathan Marsh and pt said for depression. Nathan Marsh said to please make sure that he understands that the psychiatrist will prescribe medications.  Please make sure he is not wanting to see a therapist instead. Nathan Marsh and pt verbalized understanding and said he is wanting a therapist. Told her okay I will place referral and someone will contact him to schedule an appointment. Nathan Marsh verbalized understanding.

## 2022-09-08 NOTE — Telephone Encounter (Signed)
Okay for referral or does pt need an appt?

## 2022-09-08 NOTE — Telephone Encounter (Signed)
Patient would like to be referred to a psychiatrist. Does patient need to be seen in office for this?

## 2022-09-09 NOTE — Telephone Encounter (Signed)
Referral for Psychology placed in Epic.

## 2022-09-14 DIAGNOSIS — R634 Abnormal weight loss: Secondary | ICD-10-CM | POA: Diagnosis not present

## 2022-09-14 DIAGNOSIS — G35 Multiple sclerosis: Secondary | ICD-10-CM | POA: Diagnosis not present

## 2022-09-23 DIAGNOSIS — G35 Multiple sclerosis: Secondary | ICD-10-CM | POA: Diagnosis not present

## 2022-10-01 DIAGNOSIS — R634 Abnormal weight loss: Secondary | ICD-10-CM | POA: Diagnosis not present

## 2022-10-01 DIAGNOSIS — G35 Multiple sclerosis: Secondary | ICD-10-CM | POA: Diagnosis not present

## 2022-10-05 ENCOUNTER — Telehealth: Payer: Self-pay | Admitting: Physician Assistant

## 2022-10-05 NOTE — Telephone Encounter (Signed)
Patient called himself to confirm his sister's message below.   Patient states: - He can be contacted with PCP's response at [phone].  - He is better able to answer phone from 9-12 due to aide, Varney Biles, being there

## 2022-10-05 NOTE — Telephone Encounter (Signed)
Caller states: - She is worried about patient due to him not being able to care for himself anymore  - He has been falling often and can barely get himself on the toilet or get into his bathtub - She is sure that PCP hasn't been notified of these struggles because patient "believes he is superman" - Patient is currently in CAP program and has an aide for 6 hours a day - CAP is not helping due to no one properly addressing patient's issues - Patient's home is not accessible for him   Caller states: -She is wondering if PCP has any recommendations for patient other than assisted living facility or expensive home care   Caller is aware she is not on patient DPR and can't be told any of patient's medical information. She states she is just worried about patient and wanted PCP aware.

## 2022-10-05 NOTE — Telephone Encounter (Signed)
Please see message from patient's sister and pt.

## 2022-10-06 NOTE — Telephone Encounter (Signed)
Noted  

## 2022-10-06 NOTE — Telephone Encounter (Signed)
Tried to contact pt no answer, voicemail not set up unable to leave message.

## 2022-10-09 ENCOUNTER — Encounter: Payer: Self-pay | Admitting: Physician Assistant

## 2022-10-09 ENCOUNTER — Other Ambulatory Visit: Payer: Self-pay | Admitting: Physician Assistant

## 2022-10-09 ENCOUNTER — Ambulatory Visit (INDEPENDENT_AMBULATORY_CARE_PROVIDER_SITE_OTHER): Payer: Medicare Other | Admitting: Physician Assistant

## 2022-10-09 VITALS — BP 122/82 | HR 58 | Temp 98.0°F | Ht 65.0 in | Wt 140.0 lb

## 2022-10-09 DIAGNOSIS — R296 Repeated falls: Secondary | ICD-10-CM

## 2022-10-09 DIAGNOSIS — G35 Multiple sclerosis: Secondary | ICD-10-CM

## 2022-10-09 DIAGNOSIS — Z23 Encounter for immunization: Secondary | ICD-10-CM

## 2022-10-09 MED ORDER — TRANSFER BOARD MISC: 0 refills | Status: DC

## 2022-10-09 NOTE — Patient Instructions (Addendum)
It was great to see you!  I have printed a transfer board prescription for you -- please take this to [facility] and see if they can help you with this, if they cannot, please let me know  I will ask Levada Dy (the Education officer, museum) to reach out to you again regarding your housing  I will ask my nurse to call [employer] to see if they can dedicate more time to repairing your existing wheelchair before we look for a new one  Let's follow-up in 1-3 months, sooner if you have concerns.  Take care,  [name] PA-C

## 2022-10-09 NOTE — Progress Notes (Signed)
Nathan Marsh is a 46 y.o. male here for a persistent problem.  History of Present Illness:   Chief Complaint  Patient presents with   Fall    Has increased per My chart note.   Home Care health    HPI  Recurrent Falls Patient's sister contacted office on 10/05/22 about patient having recurrent falls. Patient states that his falls typically occur when transferring into his wheelchair. He would like to get a transfer board if possible. He does not want his family on DPR- He feels that family is trying to get him moved to a group home or take advantage of his medical benefits. Patient does not like where he lives due to accessibility issues and safety. He is agreeable to trying to find alternative housing after speaking with Lenard Forth from Care Management -- will place another referral for her to reach out to him about this.  Multiple Sclerosis  Patient has been following with Neurology Stepp NP for this issue. He reports that his power wheelchair has some issues-some difficulty turning appropriately at times and uncomfortable to sit in resulting in back pain. He has tried to get this fixed for several months but [employer] staff has not been helpful. Patient has CNA at home for 6 hours per day-Family does not think that his CNA is helping him enough. Family has concerns that he cannot perform ADLs on his own-he does not have a wheelchair accessible shower or entrance.   Past Medical History:  Diagnosis Date   Multiple sclerosis (HCC)      Social History   Tobacco Use   Smoking status: Every Day    Packs/day: 0.50    Types: Cigarettes    Start date: 05/31/2000   Smokeless tobacco: Never  Vaping Use   Vaping Use: Never used  Substance Use Topics   Alcohol use: Yes    Comment: very limited   Drug use: Yes    Types: Marijuana    Past Surgical History:  Procedure Laterality Date   AMPUTATION Left 04/27/2017   Procedure: AMPUTATION DIGIT REVISION LEFT MIDDLE FINGER;   Surgeon: Knute Neu, MD;  Location: [facility] OR;  Service: Orthopedics;  Laterality: Left;    Family History  Problem Relation Age of Onset   Cancer Mother        lung    No Known Allergies  Current Medications:   Current Outpatient Medications:    baclofen (LIORESAL) 10 MG tablet, Take 10 mg by mouth 3 (three) times daily., Disp: , Rfl:    natalizumab (TYSABRI) 300 MG/15ML injection, Inject into the vein., Disp: , Rfl:    oxybutynin (DITROPAN) 5 MG tablet, Take 1 tablet by mouth 2 (two) times daily., Disp: , Rfl:    Review of Systems:   Review of Systems  Constitutional:  Negative for chills, fever, malaise/fatigue and weight loss.  HENT:  Negative for hearing loss, sinus pain and sore throat.   Respiratory:  Negative for cough and hemoptysis.   Cardiovascular:  Negative for chest pain, palpitations, leg swelling and PND.  Gastrointestinal:  Negative for abdominal pain, constipation, diarrhea, heartburn, nausea and vomiting.  Genitourinary:  Negative for dysuria, frequency and urgency.  Musculoskeletal:  Negative for back pain, myalgias and neck pain.  Skin:  Negative for itching and rash.  Neurological:  Negative for dizziness, tingling, seizures and headaches.       + Falls  Endo/Heme/Allergies:  Negative for polydipsia.  Psychiatric/Behavioral:  Negative for depression. The patient is not  nervous/anxious.     Vitals:   Vitals:   10/09/22 1049  BP: 122/82  Pulse: (!) 58  Temp: 98 F (36.7 C)  TempSrc: Temporal  SpO2: 100%  Weight: 139 lb 15.9 oz (63.5 kg)  Height: 5\' 5"  (1.651 m)     Body mass index is 23.3 kg/m.  Physical Exam:   Physical Exam Vitals and nursing note reviewed.  Constitutional:      General: He is not in acute distress.    Appearance: He is well-developed. He is not ill-appearing or toxic-appearing.  Cardiovascular:     Rate and Rhythm: Normal rate and regular rhythm.     Pulses: Normal pulses.     Heart sounds: Normal heart sounds, S1  normal and S2 normal.  Pulmonary:     Effort: Pulmonary effort is normal.     Breath sounds: Normal breath sounds.  Musculoskeletal:     Comments: Swelling to L leg No erythema/warmth  Skin:    General: Skin is warm and dry.  Neurological:     Mental Status: He is alert.     GCS: GCS eye subscore is 4. GCS verbal subscore is 5. GCS motor subscore is 6.  Psychiatric:        Speech: Speech is delayed.        Behavior: Behavior normal. Behavior is cooperative.     Assessment and Plan:   Recurrent falls; Multiple sclerosis ([place]) Will order transfer board for better mobility when using toilet/transfers Will reach out to [facility] to see if they can provide ongoing assistance for more wheelchair accessible living arrangements or other modalities to help improve quality of life and prevent falls  Need for immunization against influenza Completed today  I,Alexis Herring,acting as a scribe for [name], PA.,have documented all relevant documentation on the behalf of [name], PA,as directed by  [name], PA while in the presence of [name], [place].  I, [name], [place], have reviewed all documentation for this visit. The documentation on 10/09/22 for the exam, diagnosis, procedures, and orders are all accurate and complete.  Time spent with patient today was 31 minutes which consisted of chart review, discussing diagnosis, work up, treatment answering questions and documentation.   [name] PA-C

## 2022-10-12 ENCOUNTER — Telehealth: Payer: Self-pay | Admitting: *Deleted

## 2022-10-12 NOTE — Chronic Care Management (AMB) (Signed)
  Care Coordination   Note   10/12/2022 Name: Nathan Marsh MRN: [id number] DOB: [birth year]  Nathan Marsh is a 46 y.o. year old male who sees [place], [place], [place] for primary care. I reached out to Nathan Marsh by phone today to offer care coordination services.  [name] was given information about Care Coordination services today including:   The Care Coordination services include support from the care team which includes your Nurse Coordinator, Clinical Social Worker, or Pharmacist.  The Care Coordination team is here to help remove barriers to the health concerns and goals most important to you. Care Coordination services are voluntary, and the patient may decline or stop services at any time by request to their care team member.   Care Coordination Consent Status: Patient agreed to services and verbal consent obtained.   Follow up plan:  Telephone appointment with care coordination team member scheduled for:  10/14/2022  Encounter Outcome:  Pt. Scheduled from referral   Julian Hy, [place] Direct Dial: [phone]

## 2022-10-14 ENCOUNTER — Ambulatory Visit: Payer: Self-pay

## 2022-10-14 NOTE — Patient Outreach (Signed)
  Care Coordination   Initial Visit Note   10/14/2022 Name: Nathan Marsh MRN: [id number] DOB: [birth year]  Nathan Marsh is a 46 y.o. year old male who sees [place], [place], [place] for primary care. I spoke with  Nathan Marsh by phone today.  What matters to the patients health and wellness today?  I would like to move    Goals Addressed             This Visit's Progress    Care Coordination Activities       Care Coordination Interventions: SDoH screening performed - no acute resource challenges identified at this time Determined the patient does not have concerns with medications costs and/or adherence Discussed the patient has a CAP worker in the home 6 hours per day 7 days per week Performed chart review to note patient was seen by his primary care provider on 10/13 due to recurrent falls Patient indicates he does fall often but his provider has ordered a sliding board to assist with transfers Safety plan with patient reviewed - patient endorses he does wear a life alert pendant and is able to call for help when his aid is unavailable Determined the patient would like to move to a more wheelchair accessible home outside of [place]. Patient declines placement into assisted living or a group home Instructed patient to contact the [employer] to follow up on ability to move and retain section 8 vouchers During call with the patient, patients thought process did not seem clear at time which prompted SW to contact the patient CAP social worker Lovena Neighbours at [phone] Discussed Ms. Felipa Evener has worked with the patient for approximately 5 years. Patients only social supports are his sister and dad. Unfortunately, patients paranoia has worsened and he feels his family is trying to place him rather than help him. His sister does manage the patients money in order to pay bills while giving the patient a weekly allowance. It is indicated the patient is unable to manage  his own funds because he will spend all monies on marijuana rather than household needs Determined Ms. Heard felt patient needed 24 hour assistance last year but patients provider indicated the patient was still cognitive and able to make his own choices. Because of this Ms. Heard did not increase patients supervision needs because he would have refused placement and lost his CAP services Discussed Ms. Heard plans to follow up with patients neurologist to determine if the patients cognition has changed Collaboration with patients primary care provider to request feedback regarding patient needs and competency  SW will follow up with the patients CAP worker over the next 6 weeks          SDOH assessments and interventions completed:  Yes  SDOH Interventions Today    Flowsheet Row Most Recent Value  SDOH Interventions   Food Insecurity Interventions Intervention Not Indicated  Housing Interventions Intervention Not Indicated  Transportation Interventions Intervention Not Indicated  Utilities Interventions Intervention Not Indicated        Care Coordination Interventions Activated:  Yes  Care Coordination Interventions:  Yes, provided   Follow up plan:  SW will continue to follow    Encounter Outcome:  Pt. Visit Completed   Daneen Schick, Arita Miss, CDP Social Worker, Certified Dementia Practitioner [place] [facility] [phone]

## 2022-10-14 NOTE — Patient Instructions (Signed)
Visit Information  Thank you for taking time to visit with me today. Please don't hesitate to contact me if I can be of assistance to you.   Following are the goals we discussed today:   Goals Addressed             This Visit's Progress    Care Coordination Activities       Care Coordination Interventions: SDoH screening performed - no acute resource challenges identified at this time Determined the patient does not have concerns with medications costs and/or adherence Discussed the patient has a CAP worker in the home 6 hours per day 7 days per week Performed chart review to note patient was seen by his primary care provider on 10/13 due to recurrent falls Patient indicates he does fall often but his provider has ordered a sliding board to assist with transfers Safety plan with patient reviewed - patient endorses he does wear a life alert pendant and is able to call for help when his aid is unavailable Determined the patient would like to move to a more wheelchair accessible home outside of [place]. Patient declines placement into assisted living or a group home Instructed patient to contact the [employer] to follow up on ability to move and retain section 8 vouchers During call with the patient, patients thought process did not seem clear at time which prompted SW to contact the patient CAP social worker Lovena Neighbours at [phone] Discussed Ms. Felipa Evener has worked with the patient for approximately 5 years. Patients only social supports are his sister and dad. Unfortunately, patients paranoia has worsened and he feels his family is trying to place him rather than help him. His sister does manage the patients money in order to pay bills while giving the patient a weekly allowance. It is indicated the patient is unable to manage his own funds because he will spend all monies on marijuana rather than household needs Determined Ms. Heard felt patient needed 24 hour assistance  last year but patients provider indicated the patient was still cognitive and able to make his own choices. Because of this Ms. Heard did not increase patients supervision needs because he would have refused placement and lost his CAP services Discussed Ms. Heard plans to follow up with patients neurologist to determine if the patients cognition has changed Collaboration with patients primary care provider to request feedback regarding patient needs and competency  SW will follow up with the patients CAP worker over the next 6 weeks         If you are experiencing a Mental Health or [place] or need someone to talk to, please go to [facility] [address], [place] [phone])  The patient verbalized understanding of instructions, educational materials, and care plan provided today and DECLINED offer to receive copy of patient instructions, educational materials, and care plan.   Daneen Schick, BSW, CDP Social Worker, Certified Dementia Practitioner [facility]  Care Coordination [phone]

## 2022-10-16 ENCOUNTER — Ambulatory Visit: Payer: Self-pay

## 2022-10-16 NOTE — Patient Outreach (Signed)
  Care Coordination   Case Collaboration  Visit Note   10/16/2022 Name: Nathan Marsh MRN: [id number] DOB: 1976-02-11  Nathan Marsh is a 46 y.o. year old male who sees [place], [place], [place] for primary care. I  collaborated with patients primary care provider  What matters to the patients health and wellness today?  Case Collaboration to discuss patients cognition    Goals Addressed             This Visit's Progress    Care Coordination Activities       Care Coordination Interventions: Collaboration with patients primary care provider who indicates she feels patient is cognitive enough to make his own decisions at this time Discussed plans for SW to follow up with patients CAP worker over the next 6 weeks to determine outcome of collaboration with patients neurologist Determined patients primary care provider is open to referring the patient for a neurocognitive evaluation if desired SW will continue to follow         SDOH assessments and interventions completed:  No     Care Coordination Interventions Activated:  Yes  Care Coordination Interventions:  Yes, provided   Follow up plan:  SW will continue to follow with plans to outreach CAP case worker over the next 6 weeks    Encounter Outcome:  Pt. Visit Completed   Daneen Schick, Arita Miss, CDP Social Worker, Certified Dementia Practitioner [facility]  Care Coordination [phone]

## 2022-10-19 ENCOUNTER — Ambulatory Visit: Payer: Self-pay

## 2022-10-19 NOTE — Patient Outreach (Signed)
  Care Coordination   Multidisciplinary Case Review Note    10/19/2022 Name: Nathan Marsh MRN: [id number] DOB: 1976/02/07  Nathan Marsh is a 46 y.o. year old male who sees [place], [place], [place] for primary care.  The  multidisciplinary care team met today to review patient care needs and barriers.     Goals Addressed             This Visit's Progress    Care Coordination Activities       Care Coordination Interventions: Multi-disciplinary case discussion to review plan of care Discussed plans for SW to follow up with patients CAP worker to determine outcome of neurologist collaboration and to advise patients primary care provider does feel patient is able to make his own health care decision         SDOH assessments and interventions completed:  No     Care Coordination Interventions Activated:  Yes   Care Coordination Interventions:  Yes, provided   Follow up plan:  SW will outreach CAP caseworker over the next 10 days.    Multidisciplinary Team Attendees:   Christa See LCSW [place] Manager [place] Kandra Nicolas [facility] Manager  Scribe for Multidisciplinary Case Review:  Daneen Schick, BSW  Nadara Eaton, CDP Social Worker, Certified Dementia Practitioner [place] [facility]  Care Coordination [phone]

## 2022-10-20 ENCOUNTER — Ambulatory Visit: Payer: Self-pay

## 2022-10-20 NOTE — Patient Outreach (Signed)
  Care Coordination   Case Collaboration  Visit Note   10/20/2022 Name: SYRE KNERR MRN: [id number] DOB: 1976-11-29  [name] is a 46 y.o. year old male who sees [place], [place], [place] for primary care. I  attempted to collaborate with patients CAP caseworker by phone  What matters to the patients health and wellness today?  Unsuccessful call    Goals Addressed             This Visit's Progress    Care Coordination Activities       Care Coordination Interventions: Unsuccessful outbound call placed to the patients CAP caseworker Joi Heard - voice mailbox full, unable to leave a message SW will follow up with Ms. Heard over the next 10 days         SDOH assessments and interventions completed:  No     Care Coordination Interventions Activated:  Yes  Care Coordination Interventions:  Yes, provided   Follow up plan:  SW will continue to follow    Encounter Outcome:  No Answer   Daneen Schick, BSW, CDP Social Worker, Certified Dementia Practitioner [place] Coordination [phone]

## 2022-10-21 DIAGNOSIS — G35 Multiple sclerosis: Secondary | ICD-10-CM | POA: Diagnosis not present

## 2022-10-22 ENCOUNTER — Ambulatory Visit: Payer: Self-pay

## 2022-10-22 NOTE — Patient Outreach (Signed)
  Care Coordination   Case Collaboration  Visit Note   10/22/2022 Name: RAVIN DENARDO MRN: [id number] DOB: 04-05-1976  [name] is a 46 y.o. year old male who sees [place], [place], [place] for primary care. I  collaborated with patients CAP caseworker  What matters to the patients health and wellness today?  Assess ability to remain in the home    Goals Addressed             This Visit's Progress    COMPLETED: Care Coordination Activities       Care Coordination Interventions: Unsuccessful call placed to patients CAP caseworker Voice message left advising patients PCP does feel patient is competent at this time  Advised this SW would sign off at this time due to patients needs being addressed by CAP program Contact number provided if follow up is needed          SDOH assessments and interventions completed:  No     Care Coordination Interventions Activated:  Yes  Care Coordination Interventions:  Yes, provided   Follow up plan: No further intervention required.   Encounter Outcome:  Pt. Visit Completed   Daneen Schick, BSW, CDP Social Worker, Certified Dementia Practitioner [place] [facility]  Care Coordination [phone]

## 2022-10-28 DIAGNOSIS — R634 Abnormal weight loss: Secondary | ICD-10-CM | POA: Diagnosis not present

## 2022-10-28 DIAGNOSIS — G35 Multiple sclerosis: Secondary | ICD-10-CM | POA: Diagnosis not present

## 2022-11-04 ENCOUNTER — Ambulatory Visit (INDEPENDENT_AMBULATORY_CARE_PROVIDER_SITE_OTHER): Payer: Medicare Other | Admitting: Podiatry

## 2022-11-04 ENCOUNTER — Encounter: Payer: Self-pay | Admitting: Podiatry

## 2022-11-04 DIAGNOSIS — B351 Tinea unguium: Secondary | ICD-10-CM

## 2022-11-04 DIAGNOSIS — M79674 Pain in right toe(s): Secondary | ICD-10-CM

## 2022-11-04 DIAGNOSIS — M79675 Pain in left toe(s): Secondary | ICD-10-CM | POA: Diagnosis not present

## 2022-11-04 DIAGNOSIS — G629 Polyneuropathy, unspecified: Secondary | ICD-10-CM

## 2022-11-04 DIAGNOSIS — L309 Dermatitis, unspecified: Secondary | ICD-10-CM

## 2022-11-04 NOTE — Progress Notes (Signed)
This patient presents to the office with chief complaint of long thick painful nails.  Patient says the nails are painful walking and wearing shoes.  This patient is unable to self treat.  This patient is unable to trim his nails since she is unable to reach his nails.  He presents to the office for preventative foot care services.  He has not been seen in over 16 months and is in a wheelchair.  General Appearance  Alert, conversant and in no acute stress.  Vascular  Dorsalis pedis and posterior tibial  pulses are palpable  bilaterally.  Capillary return is within normal limits  bilaterally. Temperature is within normal limits  bilaterally.  Neurologic  Senn-Weinstein monofilament wire test within normal limits  bilaterally. Muscle power within normal limits bilaterally.  Nails Thick disfigured discolored nails with subungual debris  from hallux to fifth toes bilaterally. No evidence of bacterial infection or drainage bilaterally.  Orthopedic  No limitations of motion  feet .  No crepitus or effusions noted.  No bony pathology or digital deformities noted.  Skin  normotropic skin with no porokeratosis noted bilaterally.  No signs of infections or ulcers noted.  Dry scaly skin on the bottom of both feet.  Asymptomatic.     Onychomycosis  Nails  B/L.  Pain in right toes  Pain in left toes  Debridement of nails both feet followed trimming the nails with dremel tool.    RTC 4  months.  Told him to apply moisturizer to feet especially bottom of both feet.   Helane Gunther DPM

## 2022-11-07 ENCOUNTER — Emergency Department (HOSPITAL_COMMUNITY)
Admission: EM | Admit: 2022-11-07 | Discharge: 2022-11-08 | Disposition: A | Discharge status: Home / Self Care | Payer: Medicare Other | Attending: Student | Admitting: Student

## 2022-11-07 ENCOUNTER — Other Ambulatory Visit: Payer: Self-pay

## 2022-11-07 ENCOUNTER — Encounter (HOSPITAL_COMMUNITY): Payer: Self-pay

## 2022-11-07 ENCOUNTER — Emergency Department (HOSPITAL_COMMUNITY): Payer: Medicare Other

## 2022-11-07 DIAGNOSIS — M79662 Pain in left lower leg: Secondary | ICD-10-CM | POA: Diagnosis not present

## 2022-11-07 DIAGNOSIS — F17210 Nicotine dependence, cigarettes, uncomplicated: Secondary | ICD-10-CM | POA: Insufficient documentation

## 2022-11-07 DIAGNOSIS — S8992XA Unspecified injury of left lower leg, initial encounter: Secondary | ICD-10-CM | POA: Diagnosis not present

## 2022-11-07 DIAGNOSIS — M25561 Pain in right knee: Secondary | ICD-10-CM | POA: Diagnosis not present

## 2022-11-07 DIAGNOSIS — Y92002 Bathroom of unspecified non-institutional (private) residence single-family (private) house as the place of occurrence of the external cause: Secondary | ICD-10-CM | POA: Diagnosis not present

## 2022-11-07 DIAGNOSIS — R531 Weakness: Secondary | ICD-10-CM | POA: Insufficient documentation

## 2022-11-07 DIAGNOSIS — M79661 Pain in right lower leg: Secondary | ICD-10-CM | POA: Insufficient documentation

## 2022-11-07 DIAGNOSIS — S8991XA Unspecified injury of right lower leg, initial encounter: Secondary | ICD-10-CM | POA: Diagnosis not present

## 2022-11-07 DIAGNOSIS — M25562 Pain in left knee: Secondary | ICD-10-CM | POA: Diagnosis not present

## 2022-11-07 DIAGNOSIS — S99921A Unspecified injury of right foot, initial encounter: Secondary | ICD-10-CM | POA: Diagnosis not present

## 2022-11-07 DIAGNOSIS — W19XXXA Unspecified fall, initial encounter: Secondary | ICD-10-CM

## 2022-11-07 DIAGNOSIS — S99922A Unspecified injury of left foot, initial encounter: Secondary | ICD-10-CM | POA: Diagnosis not present

## 2022-11-07 DIAGNOSIS — M791 Myalgia, unspecified site: Secondary | ICD-10-CM | POA: Diagnosis not present

## 2022-11-07 NOTE — ED Triage Notes (Signed)
Complaining of bilateral leg pain after falling in the bathroom two days ago. Said his legs are weak and he can not stand to go to the bathroom like normal. Brought in by his home CNA.

## 2022-11-07 NOTE — ED Provider Triage Note (Signed)
Emergency Medicine Provider Triage Evaluation Note  Nathan Marsh , a 46 y.o. male  was evaluated in triage.  Pt complains of bilateral leg pain and weaknessx2 days after getting twisted in his wheelchair. Denies falling out of wheelchair, more slipped to one side. Typically can stand to pivot but can not longer do so. No new loss of bowel or bladder---is incontinent of bladder at baseline. No head or back injury..  Review of Systems  Positive: Leg pain Negative: Loss of bowel  Physical Exam  There were no vitals taken for this visit. Gen:   Awake, no distress   Resp:  Normal effort  MSK:   Pain to bilateral knee and tibia/fibula  Medical Decision Making  Medically screening exam initiated at 7:29 PM.  Appropriate orders placed.  [name] was informed that the remainder of the evaluation will be completed by another provider, this initial triage assessment does not replace that evaluation, and the importance of remaining in the ED until their evaluation is complete.    Pete Pelt, [name] 11/07/22 1936

## 2022-11-08 ENCOUNTER — Emergency Department (HOSPITAL_COMMUNITY): Payer: Medicare Other

## 2022-11-08 LAB — CBC WITH DIFFERENTIAL/PLATELET
Abs Immature Granulocytes: 0.02 10*3/uL (ref 0.00–0.07)
Basophils Absolute: 0.1 10*3/uL (ref 0.0–0.1)
Basophils Relative: 1 %
Eosinophils Absolute: 0.2 10*3/uL (ref 0.0–0.5)
Eosinophils Relative: 3 %
HCT: 39.3 % (ref 39.0–52.0)
Hemoglobin: 11.9 g/dL — ABNORMAL LOW (ref 13.0–17.0)
Immature Granulocytes: 0 %
Lymphocytes Relative: 41 %
Lymphs Abs: 3.2 10*3/uL (ref 0.7–4.0)
MCH: 23.4 pg — ABNORMAL LOW (ref 26.0–34.0)
MCHC: 30.3 g/dL (ref 30.0–36.0)
MCV: 77.4 fL — ABNORMAL LOW (ref 80.0–100.0)
Monocytes Absolute: 0.7 10*3/uL (ref 0.1–1.0)
Monocytes Relative: 9 %
Neutro Abs: 3.5 10*3/uL (ref 1.7–7.7)
Neutrophils Relative %: 46 %
Platelets: 153 10*3/uL (ref 150–400)
RBC: 5.08 MIL/uL (ref 4.22–5.81)
RDW: 16 % — ABNORMAL HIGH (ref 11.5–15.5)
WBC: 7.7 10*3/uL (ref 4.0–10.5)
nRBC: 0.8 % — ABNORMAL HIGH (ref 0.0–0.2)

## 2022-11-08 LAB — URINALYSIS, ROUTINE W REFLEX MICROSCOPIC
Bilirubin Urine: NEGATIVE
Glucose, UA: NEGATIVE mg/dL
Hgb urine dipstick: NEGATIVE
Ketones, ur: NEGATIVE mg/dL
Leukocytes,Ua: NEGATIVE
Nitrite: NEGATIVE
Protein, ur: NEGATIVE mg/dL
Specific Gravity, Urine: 1.023 (ref 1.005–1.030)
pH: 6 (ref 5.0–8.0)

## 2022-11-08 LAB — COMPREHENSIVE METABOLIC PANEL
ALT: 23 U/L (ref 0–44)
AST: 28 U/L (ref 15–41)
Albumin: 4.1 g/dL (ref 3.5–5.0)
Alkaline Phosphatase: 59 U/L (ref 38–126)
Anion gap: 6 (ref 5–15)
BUN: 19 mg/dL (ref 6–20)
CO2: 28 mmol/L (ref 22–32)
Calcium: 9.1 mg/dL (ref 8.9–10.3)
Chloride: 108 mmol/L (ref 98–111)
Creatinine, Ser: 0.92 mg/dL (ref 0.61–1.24)
GFR, Estimated: >60 mL/min (ref >60)
Glucose, Bld: 122 mg/dL — ABNORMAL HIGH (ref 70–99)
Potassium: 3.9 mmol/L (ref 3.5–5.1)
Sodium: 142 mmol/L (ref 135–145)
Total Bilirubin: 0.6 mg/dL (ref 0.3–1.2)
Total Protein: 7.2 g/dL (ref 6.5–8.1)

## 2022-11-08 NOTE — ED Provider Notes (Signed)
[facility]-EMERGENCY DEPT Provider Note  CSN: [id number] Arrival date & time: 11/07/22 0086  Chief Complaint(s) Fall  HPI Nathan Marsh is a 46 y.o. male with PMH multiple sclerosis wheelchair-bound who presents to the emergency department for evaluation of a fall.  History obtained from patient, patient's family who states that the caregiver called the family as the patient was trying to transfer from his wheelchair to the bathroom and the patient slipped out of his wheelchair and fell on the ground.  Family has concerned that the patient's functional status is starting to decline and that his MS is getting worse.  On arrival to triage, there was some complaints of bilateral lower extremity pain but for the time that I evaluated the patient, he is denying any pain in the lower extremities.  Patient denies head strike, loss of consciousness, chest pain, shortness of breath, abdominal pain, nausea, vomiting or other systemic symptoms.  Past Medical History Past Medical History:  Diagnosis Date   Multiple sclerosis [facility])    Patient Active Problem List   Diagnosis Date Noted   Pain due to onychomycosis of toenails of both feet 03/02/2022   Dermatitis 03/02/2022   Tobacco abuse 05/31/2020   Finger injury 04/27/2017   Ataxia 06/21/2013   Cerebellar tremor 06/21/2013   Essential tremor 06/21/2013   Multiple sclerosis (HCC) 10/26/2007   Home Medication(s) Prior to Admission medications   Medication Sig Start Date End Date Taking? Authorizing Provider  baclofen (LIORESAL) 10 MG tablet Take 10 mg by mouth 3 (three) times daily. 07/21/22   [provider]  Misc. Devices (TRANSFER BOARD) MISC Use as needed for transfer from wheelchair 10/09/22   Jarold Motto, PA  natalizumab (TYSABRI) 300 MG/15ML injection Inject into the vein.    [provider]  oxybutynin (DITROPAN) 5 MG tablet Take 1 tablet by mouth 2 (two) times daily. 09/09/20   [provider]                                                                                                                                    Past Surgical History Past Surgical History:  Procedure Laterality Date   AMPUTATION Left 04/27/2017   Procedure: AMPUTATION DIGIT REVISION LEFT MIDDLE FINGER;  Surgeon: Knute Neu, MD;  Location: [facility] OR;  Service: Orthopedics;  Laterality: Left;   Family History Family History  Problem Relation Age of Onset   Cancer Mother        lung    Social History Social History   Tobacco Use   Smoking status: Every Day    Packs/day: 0.50    Types: Cigarettes    Start date: 05/31/2000   Smokeless tobacco: Never  Vaping Use   Vaping Use: Never used  Substance Use Topics   Alcohol use: Yes    Comment: very limited   Drug use: Yes    Types: Marijuana   Allergies Patient  has no known allergies.  Review of Systems Review of Systems  Musculoskeletal:  Positive for arthralgias and myalgias.    Physical Exam Vital Signs  I have reviewed the triage vital signs BP 119/83   Pulse 80   Temp 98.5 F (36.9 C) (Oral)   Resp 18   Ht 5\' 6"  (1.676 m)   Wt 63.5 kg   SpO2 100%   BMI 22.60 kg/m   Physical Exam Constitutional:      General: He is not in acute distress.    Appearance: Normal appearance.  HENT:     Head: Normocephalic and atraumatic.     Nose: No congestion or rhinorrhea.  Eyes:     General:        Right eye: No discharge.        Left eye: No discharge.     Extraocular Movements: Extraocular movements intact.     Pupils: Pupils are equal, round, and reactive to light.  Cardiovascular:     Rate and Rhythm: Normal rate and regular rhythm.     Heart sounds: No murmur heard. Pulmonary:     Effort: No respiratory distress.     Breath sounds: No wheezing or rales.  Abdominal:     General: There is no distension.     Tenderness: There is no abdominal tenderness.  Musculoskeletal:        General: Normal range of motion.      Cervical back: Normal range of motion.  Skin:    General: Skin is warm and dry.  Neurological:     Mental Status: He is alert. Mental status is at baseline.     Motor: Weakness present.     ED Results and Treatments Labs (all labs ordered are listed, but only abnormal results are displayed) Labs Reviewed  COMPREHENSIVE METABOLIC PANEL - Abnormal; Notable for the following components:      Result Value   Glucose, Bld 122 (*)    All other components within normal limits  CBC WITH DIFFERENTIAL/PLATELET - Abnormal; Notable for the following components:   Hemoglobin 11.9 (*)    MCV 77.4 (*)    MCH 23.4 (*)    RDW 16.0 (*)    nRBC 0.8 (*)    All other components within normal limits  URINALYSIS, ROUTINE W REFLEX MICROSCOPIC                                                                                                                          Radiology DG Foot Complete Right  Result Date: 11/07/2022 CLINICAL DATA:  fall/twisted legs EXAM: RIGHT FOOT COMPLETE - 3+ VIEW COMPARISON:  None Available. FINDINGS: There is no evidence of fracture or dislocation. There is no evidence of arthropathy or other focal bone abnormality. Soft tissues are unremarkable. IMPRESSION: Negative. Electronically Signed   By: Iven Finn M.D.   On: 11/07/2022 20:28   DG Foot Complete Left  Result Date: 11/07/2022 CLINICAL DATA:  fall/twisted legs EXAM:  LEFT FOOT - COMPLETE 3+ VIEW COMPARISON:  None Available. FINDINGS: There is no evidence of fracture or dislocation. Cortical irregularity of the superior navicular likely degenerative changes. There is no evidence of arthropathy or other focal bone abnormality. Soft tissues are unremarkable. IMPRESSION: No acute displaced fracture or dislocation. Electronically Signed   By: Iven Finn M.D.   On: 11/07/2022 20:27   DG Tibia/Fibula Right  Result Date: 11/07/2022 CLINICAL DATA:  fall/twisted legs EXAM: RIGHT TIBIA AND FIBULA - 2 VIEW COMPARISON:  None  Available. FINDINGS: There is no evidence of fracture or other focal bone lesions. Soft tissues are unremarkable. IMPRESSION: Negative. Electronically Signed   By: Iven Finn M.D.   On: 11/07/2022 20:26   DG Tibia/Fibula Left  Result Date: 11/07/2022 CLINICAL DATA:  fall/twisted legs EXAM: LEFT TIBIA AND FIBULA - 2 VIEW COMPARISON:  None Available. FINDINGS: There is no evidence of fracture or other focal bone lesions. Soft tissues are unremarkable. IMPRESSION: Negative. Electronically Signed   By: Iven Finn M.D.   On: 11/07/2022 20:25   DG Knee Complete 4 Views Left  Result Date: 11/07/2022 CLINICAL DATA:  fall/twisted legs EXAM: LEFT KNEE - COMPLETE 4+ VIEW COMPARISON:  None Available. FINDINGS: No evidence of fracture, dislocation, or joint effusion. No evidence of arthropathy or other focal bone abnormality. Soft tissues are unremarkable. IMPRESSION: Negative. Electronically Signed   By: Iven Finn M.D.   On: 11/07/2022 20:25   DG Knee Complete 4 Views Right  Result Date: 11/07/2022 CLINICAL DATA:  fall/twisted legs EXAM: RIGHT KNEE - COMPLETE 4+ VIEW COMPARISON:  None Available. FINDINGS: No evidence of fracture, dislocation, or joint effusion. No evidence of arthropathy or other focal bone abnormality. Soft tissues are unremarkable. IMPRESSION: Negative. Electronically Signed   By: Iven Finn M.D.   On: 11/07/2022 20:24    Pertinent labs & imaging results that were available during my care of the patient were reviewed by me and considered in my medical decision making (see MDM for details).  Medications Ordered in ED Medications - No data to display                                                                                                                                   Procedures Procedures  (including critical care time)  Medical Decision Making / ED Course   This patient presents to the ED for concern of fall, this involves an extensive number of  treatment options, and is a complaint that carries with it a high risk of complications and morbidity.  The differential diagnosis includes progression of underlying MS, electrolyte abnormality, UTI, fracture, ligamentous injury, contusion, hematoma  MDM: Patient seen emergency room for evaluation of a fall.  Physical exam with bilateral lower extremity weakness but this is baseline and consistent with his known multiple sclerosis.  No new neurologic deficits.  Trauma imaging of bilateral lower extremities is negative.  Laboratory evaluation  with a hemoglobin of 11.9 but is otherwise unremarkable.  Urinalysis unremarkable.  Patient is very well-appearing here in the emergency department and has no pain.  In regards to his functional neurologic status, the patient and his family will need to reach back out to his primary care physician and neurologist to discuss home health physical therapy or additional medical therapy for his MS.  At this time he does not meet inpatient criteria for admission he is safe for discharge with outpatient follow-up   Additional history obtained: -Additional history obtained from multiple family members -External records from outside source obtained and reviewed including: Chart review including previous notes, labs, imaging, consultation notes   Lab Tests: -I ordered, reviewed, and interpreted labs.   The pertinent results include:   Labs Reviewed  COMPREHENSIVE METABOLIC PANEL - Abnormal; Notable for the following components:      Result Value   Glucose, Bld 122 (*)    All other components within normal limits  CBC WITH DIFFERENTIAL/PLATELET - Abnormal; Notable for the following components:   Hemoglobin 11.9 (*)    MCV 77.4 (*)    MCH 23.4 (*)    RDW 16.0 (*)    nRBC 0.8 (*)    All other components within normal limits  URINALYSIS, ROUTINE W REFLEX MICROSCOPIC        Imaging Studies ordered: I ordered imaging studies including multiple x-rays of the  lower extremities I independently visualized and interpreted imaging. I agree with the radiologist interpretation   Medicines ordered and prescription drug management: No orders of the defined types were placed in this encounter.   -I have reviewed the patients home medicines and have made adjustments as needed  Critical interventions none   Cardiac Monitoring: The patient was maintained on a cardiac monitor.  I personally viewed and interpreted the cardiac monitored which showed an underlying rhythm of: NSR  Social Determinants of Health:  Factors impacting patients care include: Home health aide but no home physical therapy   Reevaluation: After the interventions noted above, I reevaluated the patient and found that they have :stayed the same  Co morbidities that complicate the patient evaluation  Past Medical History:  Diagnosis Date   Multiple sclerosis ([place])       Dispostion: I considered admission for this patient, but he does not meet inpatient criteria for admission and he is safe for discharge with outpatient neurologic and PCP follow-up.     Final Clinical Impression(s) / ED Diagnoses Final diagnoses:  Fall, initial encounter     @PCDICTATION @    Teressa Lower, MD 11/08/22 1919

## 2022-11-11 ENCOUNTER — Encounter: Payer: Self-pay | Admitting: Physician Assistant

## 2022-11-11 ENCOUNTER — Ambulatory Visit (INDEPENDENT_AMBULATORY_CARE_PROVIDER_SITE_OTHER): Payer: Medicare Other | Admitting: Physician Assistant

## 2022-11-11 VITALS — BP 100/64 | HR 62 | Temp 97.3°F | Ht 66.0 in

## 2022-11-11 DIAGNOSIS — R710 Precipitous drop in hematocrit: Secondary | ICD-10-CM | POA: Diagnosis not present

## 2022-11-11 DIAGNOSIS — R296 Repeated falls: Secondary | ICD-10-CM | POA: Diagnosis not present

## 2022-11-11 DIAGNOSIS — R531 Weakness: Secondary | ICD-10-CM

## 2022-11-11 MED ORDER — MELOXICAM 15 MG PO TABS: 15.0000 mg | ORAL_TABLET | Freq: Every day | ORAL | 0 refills | Status: DC

## 2022-11-11 NOTE — Progress Notes (Signed)
Nathan Marsh is a 46 y.o. male here for a follow up of a pre-existing problem.  History of Present Illness:   Chief Complaint  Patient presents with   Leg Pain    Pt here for ED f/u from 11/11. He fell, c/o bilateral leg pain R > L.    HPI  Leg pain Patient was admitted to the ER on 11/11 due to a fall attempting to transfer him from his wheelchair to the bathroom. He reports that he was on the floor for 2-3 hours before someone was able to get him to the hospital. Caregiver reports that when they arrived, his legs were positioned away from his body in an awkward position. It is reported that he did not break anything. Patient is mainly complaining of right knee pain, but states that when his legs move such as being transferred, there is discomfort in both his legs.    He reports that his right knee only hurts when he has stand or pull himself up to stand. He explains that he thinks his inability to stand is not due to his back, but leg weakness. Patient states that since being discharged, the pain has slightly worsened. Pain occurs during physical movement and not with palpation. Patient reports that it was recommended that he start physical therapy to build strength and he is willing if the physical therapist arrives at his home. Patient states that he managed his symptoms once with Aleve with relief.   He denies any bruising on legs, back pain, and painful tailbone.   Decreased hgb This was an incidental finding on his blood work at the ER. Denies changes to fatigue, visible blood in stools.  He has a cologuard test but this has not been done yet.   Past Medical History:  Diagnosis Date   Multiple sclerosis (HCC)      Social History   Tobacco Use   Smoking status: Every Day    Packs/day: 0.50    Types: Cigarettes    Start date: 05/31/2000   Smokeless tobacco: Never  Vaping Use   Vaping Use: Never used  Substance Use Topics   Alcohol use: Yes    Comment: very limited    Drug use: Yes    Types: Marijuana    Past Surgical History:  Procedure Laterality Date   AMPUTATION Left 04/27/2017   Procedure: AMPUTATION DIGIT REVISION LEFT MIDDLE FINGER;  Surgeon: Knute Neu, MD;  Location: [facility] OR;  Service: Orthopedics;  Laterality: Left;    Family History  Problem Relation Age of Onset   Cancer Mother        lung    No Known Allergies  Current Medications:   Current Outpatient Medications:    baclofen (LIORESAL) 10 MG tablet, Take 10 mg by mouth 3 (three) times daily., Disp: , Rfl:    Misc. Devices (TRANSFER BOARD) MISC, Use as needed for transfer from wheelchair, Disp: 1 each, Rfl: 0   natalizumab (TYSABRI) 300 MG/15ML injection, Inject into the vein., Disp: , Rfl:    oxybutynin (DITROPAN) 5 MG tablet, Take 1 tablet by mouth 2 (two) times daily., Disp: , Rfl:    Review of Systems:   Review of Systems  Musculoskeletal:        (+) leg pain    Vitals:   Vitals:   11/11/22 0924  BP: 100/64  Pulse: 62  Temp: (!) 97.3 F (36.3 C)  TempSrc: Temporal  SpO2: 99%  Height: 5\' 6"  (1.676 m)  Body mass index is 22.6 kg/m.  Physical Exam:   Physical Exam Constitutional:      General: He is not in acute distress.    Appearance: Normal appearance. He is not ill-appearing.  HENT:     Head: Normocephalic and atraumatic.     Right Ear: External ear normal.     Left Ear: External ear normal.  Eyes:     Extraocular Movements: Extraocular movements intact.     Pupils: Pupils are equal, round, and reactive to light.  Cardiovascular:     Rate and Rhythm: Normal rate and regular rhythm.     Heart sounds: Normal heart sounds. No murmur heard.    No gallop.  Pulmonary:     Effort: Pulmonary effort is normal. No respiratory distress.     Breath sounds: Normal breath sounds. No wheezing or rales.  Musculoskeletal:     Comments: No TTP of b/l legs No TTP to lower spine or paraspinal muscles  Skin:    General: Skin is warm and dry.  Neurological:      Mental Status: He is alert and oriented to person, place, and time.  Psychiatric:        Judgment: Judgment normal.     Assessment and Plan:   Weakness; Recurrent falls Will order home health PT referral Follow-up in 2-4 weeks after starting to check in on strength and determine if further home needs are warranted -- he currently declines need for more care at home or change in level of care Start daily meloxicam 15 mg x 2 weeks, then prn after that Order lumbar spine out of abundance of caution to assess spine given prolonged down time and leg weakness Recommend reaching out to neurology for evaluation if he feels like his sx are progressing  Decreased hemoglobin Encouraged patient to complete cologuard -- will continue to encourage - he reports that he will do this If not completed by next visit, will push for ifob and additional blood work  Sport and exercise psychologist as a Neurosurgeon for [employer], PA.,have documented all relevant documentation on the behalf of Jarold Motto, PA,as directed by  Jarold Motto, PA while in the presence of Jarold Motto, [name].  I, Jarold Motto, [name], have reviewed all documentation for this visit. The documentation on 11/11/22 for the exam, diagnosis, procedures, and orders are all accurate and complete.  Jarold Motto, PA-C

## 2022-11-11 NOTE — Patient Instructions (Signed)
It was great to see you!  An order for an xray has been put in for you. To get your xray, you can walk in at the [facility] location without a scheduled appointment.  The address is [address] It is across the street from [facility]. X-ray is located in the basement.  Hours of operation are M-F 8:30am to 5:00pm. Please note that they are closed for lunch between 12:30 and 1:00pm.  Start daily meloxicam for your pain. Take for two weeks and then as needed after that.  We will start physical therapy.  Let's follow-up in a few weeks after you have completed some PT to see how your mobility is doing, sooner if concerns, sooner if you have concerns.   Take care,  Jarold Motto PA-C

## 2022-11-12 ENCOUNTER — Ambulatory Visit (INDEPENDENT_AMBULATORY_CARE_PROVIDER_SITE_OTHER)
Admission: RE | Admit: 2022-11-12 | Discharge: 2022-11-12 | Disposition: A | Discharge status: Home / Self Care | Payer: Medicare Other | Source: Ambulatory Visit | Attending: Physician Assistant | Admitting: Physician Assistant

## 2022-11-12 DIAGNOSIS — Z043 Encounter for examination and observation following other accident: Secondary | ICD-10-CM | POA: Diagnosis not present

## 2022-11-12 DIAGNOSIS — R531 Weakness: Secondary | ICD-10-CM | POA: Diagnosis not present

## 2022-11-12 DIAGNOSIS — M5136 Other intervertebral disc degeneration, lumbar region: Secondary | ICD-10-CM | POA: Diagnosis not present

## 2022-11-17 ENCOUNTER — Telehealth: Payer: Self-pay | Admitting: Physician Assistant

## 2022-11-17 NOTE — Telephone Encounter (Signed)
Spoke to [name], told her referral for Home Health PT was done and someone will be contacting you. Nathan Marsh asked who it was sent to? Told her I will have to send our referral coordinator a message cause I do not know and get back to you. Nathan Marsh verbalized understanding.

## 2022-11-17 NOTE — Telephone Encounter (Signed)
Caller States: -pt recently seen by PCP and imaging done. -determined arthritis and not broken.fractured bones. -With this information, patient can start physical therapy to determine what type of wheel chair is needed for him. -Ms. Heard from [facility] program recommend caller reach out to PCP team. -[employer] is the company contracted to help patient. -She does not have contact information for therapy/home health/[employer]   Caller requests: -PCP team send orders for home health physical therapy for strengthening and wheelchair selection.   Internet search shows webpage of: [email or web address]

## 2022-11-23 DIAGNOSIS — G35 Multiple sclerosis: Secondary | ICD-10-CM | POA: Diagnosis not present

## 2022-11-24 ENCOUNTER — Telehealth: Payer: Self-pay

## 2022-11-24 NOTE — Telephone Encounter (Signed)
     Patient  visit on 11/08/2022  at [facility] was for Unspecified fall, initial encounter.  Have you been able to follow up with your primary care physician? Yes  The patient was or was not able to obtain any needed medicine or equipment. Patient was able to obtain medication.  Are there diet recommendations that you are having difficulty following? No  Patient expresses understanding of discharge instructions and education provided has no other needs at this time.    Nathan Marsh [facility]  [name] Community Resource Care Guide   [name]  ?? [phone]   Website: [email or web address]  [email or web address]

## 2022-11-24 NOTE — Telephone Encounter (Signed)
Referral coordinator notified to put in referral for PT to contact [name] to schedule.

## 2022-11-30 ENCOUNTER — Telehealth: Payer: Self-pay | Admitting: Physician Assistant

## 2022-11-30 DIAGNOSIS — G35 Multiple sclerosis: Secondary | ICD-10-CM | POA: Diagnosis not present

## 2022-11-30 DIAGNOSIS — R270 Ataxia, unspecified: Secondary | ICD-10-CM | POA: Diagnosis not present

## 2022-11-30 DIAGNOSIS — R634 Abnormal weight loss: Secondary | ICD-10-CM | POA: Diagnosis not present

## 2022-11-30 DIAGNOSIS — F17210 Nicotine dependence, cigarettes, uncomplicated: Secondary | ICD-10-CM | POA: Diagnosis not present

## 2022-11-30 DIAGNOSIS — G252 Other specified forms of tremor: Secondary | ICD-10-CM | POA: Diagnosis not present

## 2022-11-30 DIAGNOSIS — G250 Essential tremor: Secondary | ICD-10-CM | POA: Diagnosis not present

## 2022-11-30 DIAGNOSIS — R296 Repeated falls: Secondary | ICD-10-CM | POA: Diagnosis not present

## 2022-11-30 NOTE — Telephone Encounter (Signed)
Home Health Verbal Orders  Agency:  [facility]   Caller: Joselyn Glassman, PT   Requesting OT/ PT/ Skilled nursing/ Social Work/ Speech:  PT and Order for power wheel chair  Reason for Request:  Work on patient's strengthening, transfers, postural control, and Home safety. Patient has been experiencing minor falls lately.   Frequency:  2 x 3 weeks then 1 x 3 weeks   Monique can be contacted for VO at [phone].

## 2022-11-30 NOTE — Telephone Encounter (Signed)
Called Nathan Marsh with [facility], verbal orders given for PT 2 x's a week for 3 weeks and then 1 x a week for 3 weeks okay per [facility] for pt. Nathan Marsh verbalized understanding. In regards to Power wheelchair pt uses [employer] and we have signed orders for that back in August. Nathan Marsh said she will contact them and see what they need. Told her okay.

## 2022-12-02 DIAGNOSIS — G252 Other specified forms of tremor: Secondary | ICD-10-CM | POA: Diagnosis not present

## 2022-12-02 DIAGNOSIS — G35 Multiple sclerosis: Secondary | ICD-10-CM | POA: Diagnosis not present

## 2022-12-02 DIAGNOSIS — R296 Repeated falls: Secondary | ICD-10-CM | POA: Diagnosis not present

## 2022-12-02 DIAGNOSIS — R270 Ataxia, unspecified: Secondary | ICD-10-CM | POA: Diagnosis not present

## 2022-12-02 DIAGNOSIS — F17210 Nicotine dependence, cigarettes, uncomplicated: Secondary | ICD-10-CM | POA: Diagnosis not present

## 2022-12-02 DIAGNOSIS — G250 Essential tremor: Secondary | ICD-10-CM | POA: Diagnosis not present

## 2022-12-04 ENCOUNTER — Telehealth: Payer: Self-pay | Admitting: Physician Assistant

## 2022-12-04 DIAGNOSIS — G252 Other specified forms of tremor: Secondary | ICD-10-CM | POA: Diagnosis not present

## 2022-12-04 DIAGNOSIS — R270 Ataxia, unspecified: Secondary | ICD-10-CM | POA: Diagnosis not present

## 2022-12-04 DIAGNOSIS — R296 Repeated falls: Secondary | ICD-10-CM | POA: Diagnosis not present

## 2022-12-04 DIAGNOSIS — G250 Essential tremor: Secondary | ICD-10-CM | POA: Diagnosis not present

## 2022-12-04 DIAGNOSIS — F17210 Nicotine dependence, cigarettes, uncomplicated: Secondary | ICD-10-CM | POA: Diagnosis not present

## 2022-12-04 DIAGNOSIS — G35 Multiple sclerosis: Secondary | ICD-10-CM | POA: Diagnosis not present

## 2022-12-04 NOTE — Telephone Encounter (Signed)
Home Health Verbal Orders  Agency:   [facility]    Requesting OT/ PT/ Skilled nursing/ Social Work/ Speech:   OT   Reason for Request:   ADL, IADL, Adaptive equipment recommendation, sitting balance, therapeutic exercises and activities.  Frequency:   1x/week for 4 weeks

## 2022-12-07 NOTE — Telephone Encounter (Signed)
Please advise 

## 2022-12-07 NOTE — Telephone Encounter (Signed)
Zacharia from [facility] called to get verbal orders, verbal orders were given

## 2022-12-08 DIAGNOSIS — G250 Essential tremor: Secondary | ICD-10-CM | POA: Diagnosis not present

## 2022-12-08 DIAGNOSIS — R270 Ataxia, unspecified: Secondary | ICD-10-CM | POA: Diagnosis not present

## 2022-12-08 DIAGNOSIS — R296 Repeated falls: Secondary | ICD-10-CM | POA: Diagnosis not present

## 2022-12-08 DIAGNOSIS — G252 Other specified forms of tremor: Secondary | ICD-10-CM | POA: Diagnosis not present

## 2022-12-08 DIAGNOSIS — G35 Multiple sclerosis: Secondary | ICD-10-CM | POA: Diagnosis not present

## 2022-12-08 DIAGNOSIS — F17210 Nicotine dependence, cigarettes, uncomplicated: Secondary | ICD-10-CM | POA: Diagnosis not present

## 2022-12-09 ENCOUNTER — Encounter: Payer: Self-pay | Admitting: Physician Assistant

## 2022-12-09 ENCOUNTER — Ambulatory Visit (INDEPENDENT_AMBULATORY_CARE_PROVIDER_SITE_OTHER): Payer: Medicare Other | Admitting: Physician Assistant

## 2022-12-09 VITALS — BP 118/70 | HR 65 | Temp 98.4°F | Ht 66.0 in | Wt 140.0 lb

## 2022-12-09 DIAGNOSIS — R531 Weakness: Secondary | ICD-10-CM

## 2022-12-09 DIAGNOSIS — M7989 Other specified soft tissue disorders: Secondary | ICD-10-CM | POA: Diagnosis not present

## 2022-12-09 MED ORDER — MELOXICAM 15 MG PO TABS: 15.0000 mg | ORAL_TABLET | Freq: Every day | ORAL | 0 refills | Status: DC

## 2022-12-09 NOTE — Patient Instructions (Addendum)
I'm going to put in orders through your Home Health Agency that is providing your PT to get additional supplies to you -- including a power recliner, transfer board, and condom cath supplies.  Keep your feet elevated and let me know if they do not improve (in regards to swelling)  Follow-up in 2-3 months, sooner if concerns.

## 2022-12-09 NOTE — Progress Notes (Signed)
Nathan Marsh is a 46 y.o. male here for a follow up of a pre-existing problem.  History of Present Illness:   Chief Complaint  Patient presents with   Weakness    Pt is still having weakness in legs and sometimes pain.   Edema    Pt is having swelling in both feet x 1 week.    HPI  Leg Weakness Patient last seen by me on 11/11/22 s/p ED visit for a fall that occurred while attempting to transfer him from his wheelchair to the bathroom.  His LE imaging studies were negative at that time. He has since had a L-spine xray which was also negative. He would like a refill of Meloxicam 15mg  as this has helped with his pain with daily use. He is agreeable to trying to cut back to only prn use.  PT has been in his home a few times to work on various exercises. He likes his physical therapist and works well with her. He feels that he is getting progressively stronger. Caregiver notices mild improvement- he is able to assist more with transfers from wheelchair. He has at least a few more visits with home health.  He has not yet received a transfer board. Family does not feel that he could use a transfer board on his own. They report that PT is going to bring a transfer board for him to try. PT has not discussed starting OT with him but he feels that this would benefit him.  He is curious about getting a condom catheter due to difficulty standing to urinate. He reports urinary urgency secondary to his MS. He sees neurology for f/u in January 2024.  Edema Bilateral feet and ankles x1 week. Has used Epsom salt soaks without improvement. He feels that he is spending more time with his feet down in his wheelchair since his fall x1 month ago. He is interested in getting a recliner so that he can elevate his legs more often while sitting at home. He states that his electric wheelchair still needs to be repaired.   Denies any open wounds, sores, or rashes. Denies any pain or redness of feet  or legs. Denies any numbness or tingling.    Past Medical History:  Diagnosis Date   Multiple sclerosis (HCC)      Social History   Tobacco Use   Smoking status: Every Day    Packs/day: 0.50    Types: Cigarettes    Start date: 05/31/2000   Smokeless tobacco: Never  Vaping Use   Vaping Use: Never used  Substance Use Topics   Alcohol use: Yes    Comment: very limited   Drug use: Yes    Types: Marijuana    Past Surgical History:  Procedure Laterality Date   AMPUTATION Left 04/27/2017   Procedure: AMPUTATION DIGIT REVISION LEFT MIDDLE FINGER;  Surgeon: 06/27/2017, MD;  Location: [facility];  Service: Orthopedics;  Laterality: Left;    Family History  Problem Relation Age of Onset   Cancer Mother        lung    No Known Allergies  Current Medications:   Current Outpatient Medications:    baclofen (LIORESAL) 10 MG tablet, Take 10 mg by mouth 3 (three) times daily., Disp: , Rfl:    meloxicam (MOBIC) 15 MG tablet, Take 1 tablet (15 mg total) by mouth daily., Disp: 30 tablet, Rfl: 0   Misc. Devices (TRANSFER BOARD) MISC, Use as needed for transfer from wheelchair, Disp: 1  each, Rfl: 0   natalizumab (TYSABRI) 300 MG/15ML injection, Inject into the vein., Disp: , Rfl:    oxybutynin (DITROPAN) 5 MG tablet, Take 1 tablet by mouth 2 (two) times daily., Disp: , Rfl:    Review of Systems:   Review of Systems  Cardiovascular:  Positive for leg swelling.  Genitourinary:  Positive for urgency.  Musculoskeletal:  Negative for joint pain and myalgias.  Skin:  Negative for rash.  Neurological:  Positive for weakness. Negative for tingling.       Negative for numbness    Vitals:   Vitals:   12/09/22 1007  BP: 118/70  Pulse: 65  Temp: 98.4 F (36.9 C)  TempSrc: Temporal  SpO2: 98%  Weight: 140 lb (63.5 kg)  Height: 5\' 6"  (1.676 m)     Body mass index is 22.6 kg/m.  Physical Exam:   Physical Exam Vitals and nursing note reviewed.  Constitutional:      Appearance: He  is well-developed.  HENT:     Head: Normocephalic.  Eyes:     Conjunctiva/sclera: Conjunctivae normal.     Pupils: Pupils are equal, round, and reactive to light.  Pulmonary:     Effort: Pulmonary effort is normal.  Musculoskeletal:        General: Normal range of motion.     Cervical back: Normal range of motion.     Comments: Bilateral feet with 1+ swelling; no breakdown noted  Skin:    General: Skin is warm and dry.  Neurological:     Mental Status: He is alert and oriented to person, place, and time.     Comments: Normal sensitivity in b/l feet  Psychiatric:        Behavior: Behavior normal.        Thought Content: Thought content normal.        Judgment: Judgment normal.     Assessment and Plan:   Weakness Improving I am going to order Home Health for OT and nursing to assist with implementing condom cath per his preference Follow-up in 1-3 months to check in on weakness, sooner if concerns  Bilateral swelling of feet No red flags Suspect dependent edema Recommend keeping legs elevated as able We will also try to reach out to his home health agency to see if they can help get a power recliner to his home If no improvement with elevation, I have asked him to reach out  I,Alexis Herring,acting as a scribe for [place], PA.,have documented all relevant documentation on the behalf of [employer], PA,as directed by  Jarold Motto, PA while in the presence of Jarold Motto, Jarold Motto.  I, [name], Jarold Motto, have reviewed all documentation for this visit. The documentation on 12/09/22 for the exam, diagnosis, procedures, and orders are all accurate and complete.  12/11/22, PA-C

## 2022-12-09 NOTE — Telephone Encounter (Signed)
Called [facility] and spoke to [place], asked her if she needed orders cause someone called yesterday,but orders were called in on 12/11. Iona Coach said no she did get orders. Told her okay.

## 2022-12-10 DIAGNOSIS — G252 Other specified forms of tremor: Secondary | ICD-10-CM | POA: Diagnosis not present

## 2022-12-10 DIAGNOSIS — R296 Repeated falls: Secondary | ICD-10-CM | POA: Diagnosis not present

## 2022-12-10 DIAGNOSIS — R270 Ataxia, unspecified: Secondary | ICD-10-CM | POA: Diagnosis not present

## 2022-12-10 DIAGNOSIS — G35 Multiple sclerosis: Secondary | ICD-10-CM | POA: Diagnosis not present

## 2022-12-10 DIAGNOSIS — G250 Essential tremor: Secondary | ICD-10-CM | POA: Diagnosis not present

## 2022-12-10 DIAGNOSIS — F17210 Nicotine dependence, cigarettes, uncomplicated: Secondary | ICD-10-CM | POA: Diagnosis not present

## 2022-12-11 DIAGNOSIS — G252 Other specified forms of tremor: Secondary | ICD-10-CM | POA: Diagnosis not present

## 2022-12-11 DIAGNOSIS — G35 Multiple sclerosis: Secondary | ICD-10-CM | POA: Diagnosis not present

## 2022-12-11 DIAGNOSIS — R270 Ataxia, unspecified: Secondary | ICD-10-CM | POA: Diagnosis not present

## 2022-12-11 DIAGNOSIS — R296 Repeated falls: Secondary | ICD-10-CM | POA: Diagnosis not present

## 2022-12-11 DIAGNOSIS — G250 Essential tremor: Secondary | ICD-10-CM | POA: Diagnosis not present

## 2022-12-11 DIAGNOSIS — F17210 Nicotine dependence, cigarettes, uncomplicated: Secondary | ICD-10-CM | POA: Diagnosis not present

## 2022-12-14 ENCOUNTER — Telehealth: Payer: Self-pay | Admitting: Physician Assistant

## 2022-12-14 DIAGNOSIS — G252 Other specified forms of tremor: Secondary | ICD-10-CM | POA: Diagnosis not present

## 2022-12-14 DIAGNOSIS — Z9181 History of falling: Secondary | ICD-10-CM

## 2022-12-14 DIAGNOSIS — R296 Repeated falls: Secondary | ICD-10-CM | POA: Diagnosis not present

## 2022-12-14 DIAGNOSIS — R270 Ataxia, unspecified: Secondary | ICD-10-CM

## 2022-12-14 DIAGNOSIS — G35 Multiple sclerosis: Secondary | ICD-10-CM

## 2022-12-14 DIAGNOSIS — G250 Essential tremor: Secondary | ICD-10-CM

## 2022-12-14 DIAGNOSIS — F17210 Nicotine dependence, cigarettes, uncomplicated: Secondary | ICD-10-CM

## 2022-12-14 NOTE — Telephone Encounter (Signed)
..  Type of form received:poc  Additional comments:   Received [id number] home health  Form should be Faxed to: [phone] Form should be mailed to:   na Is patient requesting call for pickup: no  Form placed:    Attach charge sheet.  yes Individual made aware of 3-5 business day turn around (Y/N)?  no

## 2022-12-15 ENCOUNTER — Telehealth: Payer: Self-pay | Admitting: Physician Assistant

## 2022-12-15 DIAGNOSIS — G250 Essential tremor: Secondary | ICD-10-CM | POA: Diagnosis not present

## 2022-12-15 DIAGNOSIS — G35 Multiple sclerosis: Secondary | ICD-10-CM | POA: Diagnosis not present

## 2022-12-15 DIAGNOSIS — F17210 Nicotine dependence, cigarettes, uncomplicated: Secondary | ICD-10-CM | POA: Diagnosis not present

## 2022-12-15 DIAGNOSIS — R296 Repeated falls: Secondary | ICD-10-CM | POA: Diagnosis not present

## 2022-12-15 DIAGNOSIS — R270 Ataxia, unspecified: Secondary | ICD-10-CM | POA: Diagnosis not present

## 2022-12-15 DIAGNOSIS — G252 Other specified forms of tremor: Secondary | ICD-10-CM | POA: Diagnosis not present

## 2022-12-15 NOTE — Telephone Encounter (Signed)
Caller states: -Patient is requesting a referral from PCP for mental health counseling  - If patient were to get established with a Child psychotherapist, they could be provided with different options   For additional question, Tobi Bastos can be reached at [phone].

## 2022-12-15 NOTE — Telephone Encounter (Signed)
Called [facility] and spoke to [place], verbal order given for Social Worker Evaluation okay per [place]. Zacharia verbalized understanding.

## 2022-12-15 NOTE — Telephone Encounter (Signed)
Home Health Verbal Orders  Agency:  [facility]  Caller:Zacharia  Contact and title Ph# [phone]   Requesting  Social Worker Evaluation:    Reason for Request:  Community reintegration and Community resources   Frequency:  1 w 1  HH needs F2F w/in last 30 days

## 2022-12-16 NOTE — Telephone Encounter (Signed)
Forms signed and faxed back to [facility].

## 2022-12-17 DIAGNOSIS — R270 Ataxia, unspecified: Secondary | ICD-10-CM | POA: Diagnosis not present

## 2022-12-17 DIAGNOSIS — G35 Multiple sclerosis: Secondary | ICD-10-CM | POA: Diagnosis not present

## 2022-12-17 DIAGNOSIS — R296 Repeated falls: Secondary | ICD-10-CM | POA: Diagnosis not present

## 2022-12-17 DIAGNOSIS — G250 Essential tremor: Secondary | ICD-10-CM | POA: Diagnosis not present

## 2022-12-17 DIAGNOSIS — F17210 Nicotine dependence, cigarettes, uncomplicated: Secondary | ICD-10-CM | POA: Diagnosis not present

## 2022-12-17 DIAGNOSIS — G252 Other specified forms of tremor: Secondary | ICD-10-CM | POA: Diagnosis not present

## 2022-12-18 DIAGNOSIS — G250 Essential tremor: Secondary | ICD-10-CM | POA: Diagnosis not present

## 2022-12-18 DIAGNOSIS — R296 Repeated falls: Secondary | ICD-10-CM | POA: Diagnosis not present

## 2022-12-18 DIAGNOSIS — G252 Other specified forms of tremor: Secondary | ICD-10-CM | POA: Diagnosis not present

## 2022-12-18 DIAGNOSIS — G35 Multiple sclerosis: Secondary | ICD-10-CM | POA: Diagnosis not present

## 2022-12-18 DIAGNOSIS — F17210 Nicotine dependence, cigarettes, uncomplicated: Secondary | ICD-10-CM | POA: Diagnosis not present

## 2022-12-18 DIAGNOSIS — R270 Ataxia, unspecified: Secondary | ICD-10-CM | POA: Diagnosis not present

## 2022-12-22 DIAGNOSIS — R296 Repeated falls: Secondary | ICD-10-CM | POA: Diagnosis not present

## 2022-12-22 DIAGNOSIS — G35 Multiple sclerosis: Secondary | ICD-10-CM | POA: Diagnosis not present

## 2022-12-22 DIAGNOSIS — G252 Other specified forms of tremor: Secondary | ICD-10-CM | POA: Diagnosis not present

## 2022-12-22 DIAGNOSIS — F17210 Nicotine dependence, cigarettes, uncomplicated: Secondary | ICD-10-CM | POA: Diagnosis not present

## 2022-12-22 DIAGNOSIS — R270 Ataxia, unspecified: Secondary | ICD-10-CM | POA: Diagnosis not present

## 2022-12-22 DIAGNOSIS — G250 Essential tremor: Secondary | ICD-10-CM | POA: Diagnosis not present

## 2022-12-22 NOTE — Telephone Encounter (Signed)
Received [facility] Certification paperwork. Possible duplicates? Placed in provider's box.

## 2022-12-29 DIAGNOSIS — G35 Multiple sclerosis: Secondary | ICD-10-CM | POA: Diagnosis not present

## 2022-12-29 DIAGNOSIS — R634 Abnormal weight loss: Secondary | ICD-10-CM | POA: Diagnosis not present

## 2022-12-30 DIAGNOSIS — G252 Other specified forms of tremor: Secondary | ICD-10-CM | POA: Diagnosis not present

## 2022-12-30 DIAGNOSIS — R270 Ataxia, unspecified: Secondary | ICD-10-CM | POA: Diagnosis not present

## 2022-12-30 DIAGNOSIS — G250 Essential tremor: Secondary | ICD-10-CM | POA: Diagnosis not present

## 2022-12-30 DIAGNOSIS — F17210 Nicotine dependence, cigarettes, uncomplicated: Secondary | ICD-10-CM | POA: Diagnosis not present

## 2022-12-30 DIAGNOSIS — G35 Multiple sclerosis: Secondary | ICD-10-CM | POA: Diagnosis not present

## 2022-12-30 DIAGNOSIS — R296 Repeated falls: Secondary | ICD-10-CM | POA: Diagnosis not present

## 2022-12-31 NOTE — Telephone Encounter (Signed)
FYI, see message. 

## 2022-12-31 NOTE — Telephone Encounter (Signed)
Nathan Marsh with [facility] states that the Social Worker Evaluation will not take place until next week (week of 01/04/23) due to availability of Education officer, museum.

## 2023-01-01 ENCOUNTER — Telehealth: Payer: Self-pay | Admitting: Physician Assistant

## 2023-01-01 DIAGNOSIS — F17210 Nicotine dependence, cigarettes, uncomplicated: Secondary | ICD-10-CM | POA: Diagnosis not present

## 2023-01-01 DIAGNOSIS — G252 Other specified forms of tremor: Secondary | ICD-10-CM | POA: Diagnosis not present

## 2023-01-01 DIAGNOSIS — G250 Essential tremor: Secondary | ICD-10-CM | POA: Diagnosis not present

## 2023-01-01 DIAGNOSIS — R270 Ataxia, unspecified: Secondary | ICD-10-CM | POA: Diagnosis not present

## 2023-01-01 DIAGNOSIS — R296 Repeated falls: Secondary | ICD-10-CM | POA: Diagnosis not present

## 2023-01-01 DIAGNOSIS — G35 Multiple sclerosis: Secondary | ICD-10-CM | POA: Diagnosis not present

## 2023-01-01 NOTE — Telephone Encounter (Signed)
Pt is needing to have [employer] paperwork re-faxed stating in encounter notes "Pt needs Electric Wheelchair". Please advise

## 2023-01-04 DIAGNOSIS — R296 Repeated falls: Secondary | ICD-10-CM | POA: Diagnosis not present

## 2023-01-04 DIAGNOSIS — G250 Essential tremor: Secondary | ICD-10-CM | POA: Diagnosis not present

## 2023-01-04 DIAGNOSIS — G252 Other specified forms of tremor: Secondary | ICD-10-CM | POA: Diagnosis not present

## 2023-01-04 DIAGNOSIS — G35 Multiple sclerosis: Secondary | ICD-10-CM | POA: Diagnosis not present

## 2023-01-04 DIAGNOSIS — F17210 Nicotine dependence, cigarettes, uncomplicated: Secondary | ICD-10-CM | POA: Diagnosis not present

## 2023-01-04 DIAGNOSIS — R270 Ataxia, unspecified: Secondary | ICD-10-CM | POA: Diagnosis not present

## 2023-01-05 ENCOUNTER — Telehealth: Payer: Self-pay | Admitting: Physician Assistant

## 2023-01-05 NOTE — Telephone Encounter (Signed)
Home Health Verbal Orders  Agency:  [facility]  Caller: Mila Merry and title Physical Therapist  Requesting PT:    Reason for Request:  Extend home health PT frequency -  1 per week for 3 weeks for strengthening and balance.  Frequency:  1 per week for 3 weeks  HH needs F2F w/in last 30 days

## 2023-01-05 NOTE — Telephone Encounter (Signed)
Called Nathan Marsh with [facility], verbal orders given for Extend home health PT frequency -  1 per week for 3 weeks for strengthening and balance okay per Aldona Bar. Nathan Marsh verbalized understanding.

## 2023-01-06 DIAGNOSIS — G252 Other specified forms of tremor: Secondary | ICD-10-CM | POA: Diagnosis not present

## 2023-01-06 DIAGNOSIS — G250 Essential tremor: Secondary | ICD-10-CM | POA: Diagnosis not present

## 2023-01-06 DIAGNOSIS — R296 Repeated falls: Secondary | ICD-10-CM | POA: Diagnosis not present

## 2023-01-06 DIAGNOSIS — R270 Ataxia, unspecified: Secondary | ICD-10-CM | POA: Diagnosis not present

## 2023-01-06 DIAGNOSIS — F17210 Nicotine dependence, cigarettes, uncomplicated: Secondary | ICD-10-CM | POA: Diagnosis not present

## 2023-01-06 DIAGNOSIS — G35 Multiple sclerosis: Secondary | ICD-10-CM | POA: Diagnosis not present

## 2023-01-06 NOTE — Telephone Encounter (Signed)
Spoke to pt's sister Chantell, told her received forms for pt to get new power wheelchair. Told her pt can keep appointment in March or he can make a sooner appointment  in order to complete forms. Chantell said she will let him know to schedule a sooner appt. Told her okay.

## 2023-01-08 DIAGNOSIS — R270 Ataxia, unspecified: Secondary | ICD-10-CM | POA: Diagnosis not present

## 2023-01-08 DIAGNOSIS — F17210 Nicotine dependence, cigarettes, uncomplicated: Secondary | ICD-10-CM | POA: Diagnosis not present

## 2023-01-08 DIAGNOSIS — G35 Multiple sclerosis: Secondary | ICD-10-CM | POA: Diagnosis not present

## 2023-01-08 DIAGNOSIS — G252 Other specified forms of tremor: Secondary | ICD-10-CM | POA: Diagnosis not present

## 2023-01-08 DIAGNOSIS — R296 Repeated falls: Secondary | ICD-10-CM | POA: Diagnosis not present

## 2023-01-08 DIAGNOSIS — G250 Essential tremor: Secondary | ICD-10-CM | POA: Diagnosis not present

## 2023-01-12 ENCOUNTER — Telehealth: Payer: Self-pay | Admitting: Physician Assistant

## 2023-01-12 NOTE — Telephone Encounter (Signed)
Home Health Verbal Orders  Agency:  [facility]  Caller: Oceanographer and title  Requesting OT    Reason for Request:  New orders  Frequency:  1 time a week for 2 weeks for continued OT to include ABL, sitting balance, theraputic exercise and activities  HH needs F2F w/in last 30 days

## 2023-01-13 NOTE — Telephone Encounter (Signed)
[place] spoke to [place], told her Nathan Marsh called for orders for pt, but I do not have her number. Liji said she can take order and will let Dacharia know. Verbal orders give for OT 1 time a week for 2 weeks for continued OT to include ABL, sitting balance, theraputic exercise and activities okay per [facility]. Liji verbalized understanding

## 2023-01-14 DIAGNOSIS — G250 Essential tremor: Secondary | ICD-10-CM | POA: Diagnosis not present

## 2023-01-14 DIAGNOSIS — R270 Ataxia, unspecified: Secondary | ICD-10-CM | POA: Diagnosis not present

## 2023-01-14 DIAGNOSIS — R296 Repeated falls: Secondary | ICD-10-CM | POA: Diagnosis not present

## 2023-01-14 DIAGNOSIS — F17210 Nicotine dependence, cigarettes, uncomplicated: Secondary | ICD-10-CM | POA: Diagnosis not present

## 2023-01-14 DIAGNOSIS — G252 Other specified forms of tremor: Secondary | ICD-10-CM | POA: Diagnosis not present

## 2023-01-14 DIAGNOSIS — G35 Multiple sclerosis: Secondary | ICD-10-CM | POA: Diagnosis not present

## 2023-01-15 DIAGNOSIS — G35 Multiple sclerosis: Secondary | ICD-10-CM | POA: Diagnosis not present

## 2023-01-15 DIAGNOSIS — F17210 Nicotine dependence, cigarettes, uncomplicated: Secondary | ICD-10-CM | POA: Diagnosis not present

## 2023-01-15 DIAGNOSIS — R270 Ataxia, unspecified: Secondary | ICD-10-CM | POA: Diagnosis not present

## 2023-01-15 DIAGNOSIS — R296 Repeated falls: Secondary | ICD-10-CM | POA: Diagnosis not present

## 2023-01-15 DIAGNOSIS — G252 Other specified forms of tremor: Secondary | ICD-10-CM | POA: Diagnosis not present

## 2023-01-15 DIAGNOSIS — G250 Essential tremor: Secondary | ICD-10-CM | POA: Diagnosis not present

## 2023-01-18 DIAGNOSIS — G35 Multiple sclerosis: Secondary | ICD-10-CM | POA: Diagnosis not present

## 2023-01-18 DIAGNOSIS — G250 Essential tremor: Secondary | ICD-10-CM | POA: Diagnosis not present

## 2023-01-18 DIAGNOSIS — G252 Other specified forms of tremor: Secondary | ICD-10-CM | POA: Diagnosis not present

## 2023-01-18 DIAGNOSIS — R296 Repeated falls: Secondary | ICD-10-CM | POA: Diagnosis not present

## 2023-01-18 DIAGNOSIS — F17210 Nicotine dependence, cigarettes, uncomplicated: Secondary | ICD-10-CM | POA: Diagnosis not present

## 2023-01-18 DIAGNOSIS — R270 Ataxia, unspecified: Secondary | ICD-10-CM | POA: Diagnosis not present

## 2023-01-20 DIAGNOSIS — R634 Abnormal weight loss: Secondary | ICD-10-CM | POA: Diagnosis not present

## 2023-01-20 DIAGNOSIS — G35 Multiple sclerosis: Secondary | ICD-10-CM | POA: Diagnosis not present

## 2023-01-22 ENCOUNTER — Telehealth: Payer: Self-pay | Admitting: Physician Assistant

## 2023-01-22 DIAGNOSIS — G250 Essential tremor: Secondary | ICD-10-CM | POA: Diagnosis not present

## 2023-01-22 DIAGNOSIS — R270 Ataxia, unspecified: Secondary | ICD-10-CM | POA: Diagnosis not present

## 2023-01-22 DIAGNOSIS — G35 Multiple sclerosis: Secondary | ICD-10-CM | POA: Diagnosis not present

## 2023-01-22 DIAGNOSIS — G252 Other specified forms of tremor: Secondary | ICD-10-CM | POA: Diagnosis not present

## 2023-01-22 DIAGNOSIS — R296 Repeated falls: Secondary | ICD-10-CM | POA: Diagnosis not present

## 2023-01-22 DIAGNOSIS — F17210 Nicotine dependence, cigarettes, uncomplicated: Secondary | ICD-10-CM | POA: Diagnosis not present

## 2023-01-22 NOTE — Telephone Encounter (Signed)
Most recent OV notes from 12/09/2022 sent to [facility] due to weakness confirmation received

## 2023-01-22 NOTE — Telephone Encounter (Signed)
Caller states:  -They received order for bedside commode but will need face to face notes.  - These serve as reasoning for why it's needed  Fax number is [phone].

## 2023-01-25 DIAGNOSIS — G35 Multiple sclerosis: Secondary | ICD-10-CM | POA: Diagnosis not present

## 2023-01-25 DIAGNOSIS — R252 Cramp and spasm: Secondary | ICD-10-CM | POA: Diagnosis not present

## 2023-01-25 DIAGNOSIS — R3915 Urgency of urination: Secondary | ICD-10-CM | POA: Diagnosis not present

## 2023-01-25 DIAGNOSIS — Z79899 Other long term (current) drug therapy: Secondary | ICD-10-CM | POA: Diagnosis not present

## 2023-01-25 DIAGNOSIS — F32A Depression, unspecified: Secondary | ICD-10-CM | POA: Diagnosis not present

## 2023-01-26 DIAGNOSIS — M6281 Muscle weakness (generalized): Secondary | ICD-10-CM | POA: Diagnosis not present

## 2023-01-27 DIAGNOSIS — G250 Essential tremor: Secondary | ICD-10-CM | POA: Diagnosis not present

## 2023-01-27 DIAGNOSIS — Z7409 Other reduced mobility: Secondary | ICD-10-CM | POA: Diagnosis not present

## 2023-01-27 DIAGNOSIS — R296 Repeated falls: Secondary | ICD-10-CM | POA: Diagnosis not present

## 2023-01-27 DIAGNOSIS — F17210 Nicotine dependence, cigarettes, uncomplicated: Secondary | ICD-10-CM | POA: Diagnosis not present

## 2023-01-27 DIAGNOSIS — Z789 Other specified health status: Secondary | ICD-10-CM | POA: Diagnosis not present

## 2023-01-27 DIAGNOSIS — G35 Multiple sclerosis: Secondary | ICD-10-CM | POA: Diagnosis not present

## 2023-01-27 DIAGNOSIS — R270 Ataxia, unspecified: Secondary | ICD-10-CM | POA: Diagnosis not present

## 2023-01-27 DIAGNOSIS — R29898 Other symptoms and signs involving the musculoskeletal system: Secondary | ICD-10-CM | POA: Diagnosis not present

## 2023-01-27 DIAGNOSIS — G252 Other specified forms of tremor: Secondary | ICD-10-CM | POA: Diagnosis not present

## 2023-01-28 DIAGNOSIS — G250 Essential tremor: Secondary | ICD-10-CM | POA: Diagnosis not present

## 2023-01-28 DIAGNOSIS — G252 Other specified forms of tremor: Secondary | ICD-10-CM | POA: Diagnosis not present

## 2023-01-28 DIAGNOSIS — F17210 Nicotine dependence, cigarettes, uncomplicated: Secondary | ICD-10-CM | POA: Diagnosis not present

## 2023-01-28 DIAGNOSIS — R296 Repeated falls: Secondary | ICD-10-CM | POA: Diagnosis not present

## 2023-01-28 DIAGNOSIS — R270 Ataxia, unspecified: Secondary | ICD-10-CM | POA: Diagnosis not present

## 2023-01-28 DIAGNOSIS — G35 Multiple sclerosis: Secondary | ICD-10-CM | POA: Diagnosis not present

## 2023-02-05 DIAGNOSIS — G35 Multiple sclerosis: Secondary | ICD-10-CM | POA: Diagnosis not present

## 2023-02-08 DIAGNOSIS — Z7409 Other reduced mobility: Secondary | ICD-10-CM | POA: Diagnosis not present

## 2023-02-08 DIAGNOSIS — G250 Essential tremor: Secondary | ICD-10-CM | POA: Diagnosis not present

## 2023-02-08 DIAGNOSIS — G35 Multiple sclerosis: Secondary | ICD-10-CM | POA: Diagnosis not present

## 2023-02-08 DIAGNOSIS — R270 Ataxia, unspecified: Secondary | ICD-10-CM | POA: Diagnosis not present

## 2023-02-08 DIAGNOSIS — G252 Other specified forms of tremor: Secondary | ICD-10-CM | POA: Diagnosis not present

## 2023-02-08 DIAGNOSIS — Z789 Other specified health status: Secondary | ICD-10-CM | POA: Diagnosis not present

## 2023-02-08 NOTE — Progress Notes (Signed)
Nathan Marsh is a 47 y.o. male here for a new problem.  History of Present Illness:   No chief complaint on file.   HPI  Multiple Sclerosis   Edema LE     Past Medical History:  Diagnosis Date   Multiple sclerosis ([place])      Social History   Tobacco Use   Smoking status: Every Day    Packs/day: 0.50    Types: Cigarettes    Start date: 05/31/2000   Smokeless tobacco: Never  Vaping Use   Vaping Use: Never used  Substance Use Topics   Alcohol use: Yes    Comment: very limited   Drug use: Yes    Types: Marijuana    Past Surgical History:  Procedure Laterality Date   AMPUTATION Left 04/27/2017   Procedure: AMPUTATION DIGIT REVISION LEFT MIDDLE FINGER;  Surgeon: Dayna Barker, MD;  Location: [place] [facility];  Service: Orthopedics;  Laterality: Left;    Family History  Problem Relation Age of Onset   Cancer Mother        lung    No Known Allergies  Current Medications:   Current Outpatient Medications:    baclofen (LIORESAL) 10 MG tablet, Take 10 mg by mouth 3 (three) times daily., Disp: , Rfl:    meloxicam (MOBIC) 15 MG tablet, Take 1 tablet (15 mg total) by mouth daily., Disp: 30 tablet, Rfl: 0   Misc. Devices (TRANSFER BOARD) MISC, Use as needed for transfer from wheelchair, Disp: 1 each, Rfl: 0   natalizumab (TYSABRI) 300 MG/15ML injection, Inject into the vein., Disp: , Rfl:    oxybutynin (DITROPAN) 5 MG tablet, Take 1 tablet by mouth 2 (two) times daily., Disp: , Rfl:    Review of Systems:   ROS  Vitals:   There were no vitals filed for this visit.   There is no height or weight on file to calculate BMI.  Physical Exam:   Physical Exam  Assessment and Plan:   ***   I,Alexander Ruley,acting as a scribe for [name], PA.,have documented all relevant documentation on the behalf of [name], PA,as directed by  [name], PA while in the presence of [name], [place].   ***   [name], PA-C

## 2023-02-10 ENCOUNTER — Telehealth: Payer: Self-pay | Admitting: *Deleted

## 2023-02-10 ENCOUNTER — Encounter: Payer: Self-pay | Admitting: Physician Assistant

## 2023-02-10 ENCOUNTER — Ambulatory Visit (INDEPENDENT_AMBULATORY_CARE_PROVIDER_SITE_OTHER): Payer: 59 | Admitting: Physician Assistant

## 2023-02-10 VITALS — BP 122/70 | HR 90 | Temp 98.0°F | Ht 66.0 in

## 2023-02-10 DIAGNOSIS — R262 Difficulty in walking, not elsewhere classified: Secondary | ICD-10-CM | POA: Diagnosis not present

## 2023-02-10 DIAGNOSIS — G35 Multiple sclerosis: Secondary | ICD-10-CM | POA: Diagnosis not present

## 2023-02-10 DIAGNOSIS — M7989 Other specified soft tissue disorders: Secondary | ICD-10-CM | POA: Diagnosis not present

## 2023-02-10 MED ORDER — FUROSEMIDE 20 MG PO TABS: 20.0000 mg | ORAL_TABLET | Freq: Every day | ORAL | 0 refills | Status: DC | PRN

## 2023-02-10 NOTE — Telephone Encounter (Signed)
Called [employer] at [phone] and left message on personal voicemail Ivin Booty. I need new form for Wheelchair evaluation so we can send over recent office notes and signed order, the one we have is  not correct date. Our fax number is [phone] or if questions can me back at [phone].

## 2023-02-10 NOTE — Patient Instructions (Signed)
It was great to see you!  I'm going to try to complete all of your paperwork today  Let's start with size Large compression stocking with 20-30 pressure.  Please plan to follow-up with vascular -- I have placed referral, they should call you for this  May take as needed lasix for fluid  Take care,  [name] PA-C

## 2023-02-11 ENCOUNTER — Telehealth: Payer: Self-pay | Admitting: Physician Assistant

## 2023-02-11 NOTE — Telephone Encounter (Signed)
Patient has a new Preferred Pharmacy (listed below and in chart)  [place], [place] AT [facility] Phone: [phone]  Fax: [phone]

## 2023-02-11 NOTE — Telephone Encounter (Signed)
Noted  

## 2023-02-12 NOTE — Telephone Encounter (Signed)
Left message on voicemail for Ivin Booty at [employer] to call me regarding patient.

## 2023-02-15 ENCOUNTER — Ambulatory Visit: Payer: 59 | Admitting: Podiatry

## 2023-02-17 NOTE — Telephone Encounter (Signed)
Called Nathan Marsh at [phone] and left message on her voicemail to call me.

## 2023-02-17 NOTE — Telephone Encounter (Signed)
Spoke to [place] with [employer] told her trying to get new form to order pt 's power wheelchair. The one we sent on 01/01/2023 with notes was not correct. Need new form. Larene Beach said looks like Gracy Bruins is working on pt's case. She said to call her. Told her okay.

## 2023-02-17 NOTE — Telephone Encounter (Signed)
Called Tina back at [employer] left message again to call office.

## 2023-02-23 NOTE — Telephone Encounter (Signed)
Received fax from [employer], papers put in Samantha's folder to sign.

## 2023-02-23 NOTE — Telephone Encounter (Signed)
Called Gracy Bruins at [phone] with [employer], told her I am trying to get a new Wheelchair evaluation form to complete again since pt was in for visit. Otila Kluver said she has that but they sent over a packet on 2/21 for pt to complete. Told her I did not received packet. Otila Kluver said she will fax it over again and verified fax number. She said if I do not receive by end of day to let her know. Told her okay, thank you.

## 2023-02-25 ENCOUNTER — Ambulatory Visit (INDEPENDENT_AMBULATORY_CARE_PROVIDER_SITE_OTHER): Payer: 59 | Admitting: Physician Assistant

## 2023-02-25 ENCOUNTER — Encounter: Payer: Self-pay | Admitting: Physician Assistant

## 2023-02-25 ENCOUNTER — Telehealth: Payer: Self-pay | Admitting: Physician Assistant

## 2023-02-25 ENCOUNTER — Ambulatory Visit: Payer: Self-pay

## 2023-02-25 VITALS — BP 112/70 | HR 58 | Temp 97.8°F | Ht 66.0 in | Wt 117.5 lb

## 2023-02-25 DIAGNOSIS — M7989 Other specified soft tissue disorders: Secondary | ICD-10-CM

## 2023-02-25 DIAGNOSIS — R296 Repeated falls: Secondary | ICD-10-CM | POA: Diagnosis not present

## 2023-02-25 NOTE — Patient Outreach (Signed)
  Care Coordination   Collaboration with Primary Care Provider     02/25/2023 Name: Nathan Marsh MRN: [id number] DOB: 01-Jul-1976  Nathan Marsh is a 47 y.o. year old male who sees [place], [place], [place] for primary care. I  received an inbound call from patients primary care provider Nathan Coke, PA who indicates patient was assessed in the home on 2/13 by that CAP nurse and social worker. Patient was advised he would need to be placed by the end of March or he would lose his CAP services due to concerns for patients ability to remain in the home safely. Patients primary care provider requests feedback on how she may appeal this decision as he is refusing placement and she does not want him to lose the 6 caregiver hours he is currently receiving. BSW advised patients primary care provider to contact the patients CAP caseworker directly.  SDOH assessments and interventions completed:  No     Care Coordination Interventions:  Yes, provided   Interventions Today    Flowsheet Row Most Recent Value  Chronic Disease   Chronic disease during today's visit Other  General Interventions   General Interventions Discussed/Reviewed Communication with  Communication with PCP/Specialists        Follow up plan: No further intervention required.   Encounter Outcome:  Pt. Visit Completed   Nathan Marsh, BSW, CDP Social Worker, Certified Dementia Practitioner [place] [facility]  Care Coordination [phone]

## 2023-02-25 NOTE — Telephone Encounter (Signed)
Lamarr Lulas with [employer] back, told her I spoke with Gracy Bruins yesterday and she refaxed the packet over to [place]. We completed the packet and sent office notes back to her this morning. Caren Griffins verbalized understanding and will check with her.

## 2023-02-25 NOTE — Telephone Encounter (Signed)
Forms completed and signed by Aldona Bar and faxed back to [place] with [employer].

## 2023-02-25 NOTE — Telephone Encounter (Signed)
Caller states she faxed order for power wheel chair on 02/23/23. Has this been received? If not, can contact Caren Griffins @ [phone] to have re-faxed.

## 2023-02-25 NOTE — Progress Notes (Signed)
Nathan Marsh is a 47 y.o. male here for a follow up of a pre-existing problem.  History of Present Illness:   Chief Complaint  Patient presents with   Leg Swelling    HPI  Bilateral Lower Extremity Edema He has not been able to start his Lasix or wear compression socks.  His aid will be picking up his medications and compression socks for him.  He has been keeping his legs elevated and this has helped some   Long-Term Care He is in the CAP program  His CAP program social worker, Lovena Neighbours, has recently advised patient that due to his decreasing mobility, which has been confirmed by OT/PT who have recently gone out to this home, that he has 30 days to arrange 24-hour care  This care can be at a facility or arranged at his home He is accompanied by his aid and her boss.  His aid is with him from 9 am-12pm and 5pm -8pm. His aid believe he needs aid, but not that he requires 24 hour care.  Unable to safely transfer Has spoken with OT and PT  He also has been evaluated by Daneen Schick LCSW with our group.  Past Medical History:  Diagnosis Date   Multiple sclerosis ([place])      Social History   Tobacco Use   Smoking status: Every Day    Packs/day: 0.50    Types: Cigarettes    Start date: 05/31/2000   Smokeless tobacco: Never  Vaping Use   Vaping Use: Never used  Substance Use Topics   Alcohol use: Yes    Comment: very limited   Drug use: Yes    Types: Marijuana    Past Surgical History:  Procedure Laterality Date   AMPUTATION Left 04/27/2017   Procedure: AMPUTATION DIGIT REVISION LEFT MIDDLE FINGER;  Surgeon: Dayna Barker, MD;  Location: [facility];  Service: Orthopedics;  Laterality: Left;    Family History  Problem Relation Age of Onset   Cancer Mother        lung    No Known Allergies  Current Medications:   Current Outpatient Medications:    baclofen (LIORESAL) 10 MG tablet, Take 10 mg by mouth 3 (three) times daily., Disp: , Rfl:    Misc. Devices  (TRANSFER BOARD) MISC, Use as needed for transfer from wheelchair, Disp: 1 each, Rfl: 0   natalizumab (TYSABRI) 300 MG/15ML injection, Inject into the vein., Disp: , Rfl:    oxybutynin (DITROPAN) 5 MG tablet, Take 1 tablet by mouth 2 (two) times daily., Disp: , Rfl:    furosemide (LASIX) 20 MG tablet, Take 1 tablet (20 mg total) by mouth daily as needed for fluid. (Patient not taking: Reported on 02/25/2023), Disp: 30 tablet, Rfl: 0   Review of Systems:   Review of Systems  Cardiovascular:  Positive for leg swelling (bilateral -- has improved).    Vitals:   Vitals:   02/25/23 1033  BP: 112/70  Pulse: (!) 58  Temp: 97.8 F (36.6 C)  TempSrc: Temporal  SpO2: 97%  Weight: 117 lb 8 oz (53.3 kg)  Height: '5\' 6"'$  (1.676 m)     Body mass index is 18.96 kg/m.  Physical Exam:   Physical Exam Vitals and nursing note reviewed.  Constitutional:      Appearance: He is well-developed.  HENT:     Head: Normocephalic.  Eyes:     Conjunctiva/sclera: Conjunctivae normal.     Pupils: Pupils are equal, round, and reactive to  light.  Pulmonary:     Effort: Pulmonary effort is normal.  Musculoskeletal:        General: Normal range of motion.     Cervical back: Normal range of motion.     Comments: Bilateral LE swelling, improved from last visit  Skin:    General: Skin is warm and dry.  Neurological:     Mental Status: He is alert and oriented to person, place, and time.  Psychiatric:        Behavior: Behavior normal.        Thought Content: Thought content normal.        Judgment: Judgment normal.     Assessment and Plan:   Bilateral swelling of feet Slightly improved He has not started his Lasix so he was encouraged to do so He is also not had his compression stockings picked up, so another prescription was given Follow-up if no improvement of symptoms or in 3 months  Recurrent falls I personally spoke with Daneen Schick, LCSW with [facility] and Bethann Punches LCSW with the health  department.  I reviewed case with both parties separately. Ms. Felipa Evener confirmed to me that patient has been determined to be unsafe to be home by himself and requires 24-hour supervision.  She states that this can be at a nursing facility, at home with his current caregiver and a friend/family member, or other alternative arrangements that may be appropriate.  Patient is unable to transfer safely by himself and to reduce risk of falls and other outcomes, it is recommended that he has access to someone at all times in his home.  After learning this information and reviewing with patient, he reports that he does have a friend in mind that can stay with him when his caregiver is not there.  I relayed this information to Bethann Punches and she said that she was going to look into this.  Patient continues to avoid regular communication with family as he feels though they do not keep his best interest in mind.  I,Rachel Rivera,acting as a Education administrator for [employer], PA.,have documented all relevant documentation on the behalf of Nathan Coke, PA,as directed by  Nathan Coke, PA while in the presence of Nathan Marsh.  I, Nathan Marsh, have reviewed all documentation for this visit. The documentation on 02/25/23 for the exam, diagnosis, procedures, and orders are all accurate and complete.  Time spent with patient today was 95 minutes which consisted of chart review, discussing diagnosis, work up, treatment answering questions and documentation.  This also included time spent talking to Daneen Schick and two phone calls to [facility].  Nathan Coke, PA-C

## 2023-02-26 ENCOUNTER — Other Ambulatory Visit: Payer: Self-pay | Admitting: *Deleted

## 2023-02-26 DIAGNOSIS — M79604 Pain in right leg: Secondary | ICD-10-CM

## 2023-03-03 NOTE — Progress Notes (Signed)
Nathan Marsh is a 47 y.o. male here for a follow up of a pre-existing problem.  History of Present Illness:   Chief Complaint  Patient presents with   Leg Swelling    HPI  Bilateral Lower Extremity Edema Treated with Lasix 20 mg daily as needed. Has been taking daily. Requesting refill. Urinating normally even with lasix. Seen by vascular surgery PA, Leontine Locket, who instructed him to continue using compression socks and elevate legs.  Long-term Care CAP program social worker, Lovena Neighbours, managing in-home care. Has someone living with him, Lenna Sciara, his friend's aunt, but is not receiving constant care from her. Has aids who are with him part-time.  Lesion on Penis Nurse aid noticed lesion on penis while he was urinating recently. Wears adult diapers. His aid changes these about twice daily and he is using a urinal in-between these changes. She states that his diaper is quite soaked whenever he changes it.   Past Medical History:  Diagnosis Date   Multiple sclerosis ([place])      Social History   Tobacco Use   Smoking status: Every Day    Packs/day: 0.50    Types: Cigarettes    Start date: 05/31/2000   Smokeless tobacco: Never  Vaping Use   Vaping Use: Never used  Substance Use Topics   Alcohol use: Yes    Comment: very limited   Drug use: Yes    Types: Marijuana    Past Surgical History:  Procedure Laterality Date   AMPUTATION Left 04/27/2017   Procedure: AMPUTATION DIGIT REVISION LEFT MIDDLE FINGER;  Surgeon: Dayna Barker, MD;  Location: [facility];  Service: Orthopedics;  Laterality: Left;    Family History  Problem Relation Age of Onset   Cancer Mother        lung    No Known Allergies  Current Medications:   Current Outpatient Medications:    baclofen (LIORESAL) 10 MG tablet, Take 10 mg by mouth 3 (three) times daily., Disp: , Rfl:    Misc. Devices (TRANSFER BOARD) MISC, Use as needed for transfer from wheelchair, Disp: 1 each, Rfl: 0   natalizumab  (TYSABRI) 300 MG/15ML injection, Inject into the vein., Disp: , Rfl:    oxybutynin (DITROPAN) 5 MG tablet, Take 1 tablet by mouth 2 (two) times daily., Disp: , Rfl:    furosemide (LASIX) 20 MG tablet, Take 1 tablet (20 mg total) by mouth daily as needed for edema., Disp: 30 tablet, Rfl: 0   Review of Systems:   Review of Systems  Constitutional:  Negative for fever and malaise/fatigue.  HENT:  Negative for congestion.   Eyes:  Negative for blurred vision.  Respiratory:  Negative for cough and shortness of breath.   Cardiovascular:  Negative for chest pain, palpitations and leg swelling.  Gastrointestinal:  Negative for vomiting.  Musculoskeletal:  Negative for back pain.  Skin:  Negative for rash.       (+) Lesion on penis  Neurological:  Negative for loss of consciousness and headaches.    Vitals:   Vitals:   03/10/23 0903  BP: 120/70  Pulse: 76  Temp: 98.6 F (37 C)  TempSrc: Temporal  Weight: 117 lb 8 oz (53.3 kg)  Height: 5\' 6"  (1.676 m)     Body mass index is 18.96 kg/m.  Physical Exam:   Physical Exam Vitals and nursing note reviewed.  Constitutional:      Appearance: He is well-developed.  HENT:     Head: Normocephalic.  Eyes:  Conjunctiva/sclera: Conjunctivae normal.     Pupils: Pupils are equal, round, and reactive to light.  Cardiovascular:     Comments: Trace bilateral edema Pulmonary:     Effort: Pulmonary effort is normal.  Genitourinary:    Comments: Small area of breakdown on tip of penis without discharge Musculoskeletal:        General: Normal range of motion.     Cervical back: Normal range of motion.  Skin:    General: Skin is warm and dry.  Neurological:     Mental Status: He is alert and oriented to person, place, and time.  Psychiatric:        Behavior: Behavior normal.        Thought Content: Thought content normal.        Judgment: Judgment normal.     Assessment and Plan:   Bilateral swelling of feet Significantly  improved Update BMP -- continue prn lasix, elevation of legs and compression stockings  Distressed about housing issues Reviewed my concerns regarding inadequate care with patient, aide and social worker Education officer, museum is aware of issues and addressing them Recommend follow-up in 3 months, sooner if concerns  Penile lesion Suspect dermatitis from urinary incontinence Start diaper cream with zinc oxide to area Keep area as dry as possible Follow-up if new/worsening/lack of improvement  I,Alexander Ruley,acting as a Education administrator for [employer], PA.,have documented all relevant documentation on the behalf of [name], PA,as directed by  [name], PA while in the presence of [name], [place].  I, [name], [place], have reviewed all documentation for this visit. The documentation on 03/10/23 for the exam, diagnosis, procedures, and orders are all accurate and complete.  Time spent with patient on 03/10/23 was 25 minutes which involved discussing recent vascular visit, housing issues, swelling of legs and penile lesion. An additional 12 minutes involved calling LCSW Joi Heard reviewing his case and current living situation and barriers.    [name], PA-C

## 2023-03-04 ENCOUNTER — Other Ambulatory Visit: Payer: Self-pay | Admitting: Physician Assistant

## 2023-03-09 ENCOUNTER — Ambulatory Visit (HOSPITAL_COMMUNITY)
Admission: RE | Admit: 2023-03-09 | Discharge: 2023-03-09 | Disposition: A | Discharge status: Home / Self Care | Payer: 59 | Source: Ambulatory Visit | Attending: Vascular Surgery | Admitting: Vascular Surgery

## 2023-03-09 ENCOUNTER — Ambulatory Visit (INDEPENDENT_AMBULATORY_CARE_PROVIDER_SITE_OTHER): Payer: 59 | Admitting: Physician Assistant

## 2023-03-09 VITALS — BP 103/69 | HR 73 | Temp 98.2°F

## 2023-03-09 DIAGNOSIS — M7989 Other specified soft tissue disorders: Secondary | ICD-10-CM

## 2023-03-09 DIAGNOSIS — M79604 Pain in right leg: Secondary | ICD-10-CM

## 2023-03-09 NOTE — Progress Notes (Signed)
VASCULAR [facility]IN [facility]ISTS           OF [place]  History and Physical   KEYTH KUPEC is a 47 y.o. male who presents with BLE edema.   The pt was seen by his PCP on 02/25/2023 for BLE edema.  He had not been able to start his lasix and he was not wearing his compression socks.  He was elevating his legs, which was helpful.  He was referred for further evaluation.    Pt has hx of MS.  He is non ambulatory.  He states he has had swelling in his legs for a couple of months and the left is worse than the right.  He states it is not painful and he is not bothered by it.  He states it gets better with elevation.  He does not have any rest pain or wounds on his feet.  He has never had a DVT. He states he does wear compression socks, which help.  He does smoke.    The pt is not on a statin for cholesterol management.  The pt is not on a daily aspirin.   Other AC:  none The pt is on diuretic for hypertension.   The pt is not diabetic.   Tobacco hx:  current   Past Medical History:  Diagnosis Date   Multiple sclerosis ([place])     Past Surgical History:  Procedure Laterality Date   AMPUTATION Left 04/27/2017   Procedure: AMPUTATION DIGIT REVISION LEFT MIDDLE FINGER;  Surgeon: Dayna Barker, MD;  Location: [facility];  Service: Orthopedics;  Laterality: Left;    Social History   Socioeconomic History   Marital status: Single    Spouse name: Not on file   Number of children: Not on file   Years of education: Not on file   Highest education level: Not on file  Occupational History   Not on file  Tobacco Use   Smoking status: Every Day    Packs/day: 0.50    Types: Cigarettes    Start date: 05/31/2000   Smokeless tobacco: Never  Vaping Use   Vaping Use: Never used  Substance and Sexual Activity   Alcohol use: Yes    Comment: very limited   Drug use: Yes    Types: Marijuana   Sexual activity: Not Currently  Other Topics Concern   Not on file  Social History  Narrative   Live alone   Single   No children   Has disability       Social Determinants of Health   Financial Resource Strain: Low Risk  (07/23/2022)   Overall Financial Resource Strain (CARDIA)    Difficulty of Paying Living Expenses: Not hard at all  Food Insecurity: No Food Insecurity (10/14/2022)   Hunger Vital Sign    Worried About Running Out of Food in the Last Year: Never true    [place] in the Last Year: Never true  Transportation Needs: No Transportation Needs (10/14/2022)   PRAPARE - Hydrologist (Medical): No    Lack of Transportation (Non-Medical): No  Physical Activity: Inactive (07/23/2022)   Exercise Vital Sign    Days of Exercise per Week: 0 days    Minutes of Exercise per Session: 0 min  Stress: Stress Concern Present (07/23/2022)   [place]    Feeling of Stress : To some extent  Social Connections: Moderately Isolated (07/23/2022)   Social Connection and Isolation Panel [NHANES]    Frequency of Communication with Friends and Family: More than three times a week    Frequency of Social Gatherings with Friends and Family: Once a week    Attends Religious Services: 1 to 4 times per year    Active Member of [employer] or Organizations: No    Attends Archivist Meetings: Never    Marital Status: Never married  Intimate Partner Violence: Not At Risk (07/23/2022)   Humiliation, Afraid, Rape, and Kick questionnaire    Fear of Current or Ex-Partner: No    Emotionally Abused: No    Physically Abused: No    Sexually Abused: No     Family History  Problem Relation Age of Onset   Cancer Mother        lung    Current Outpatient Medications  Medication Sig Dispense Refill   baclofen (LIORESAL) 10 MG tablet Take 10 mg by mouth 3 (three) times daily.     furosemide (LASIX) 20 MG tablet TAKE 1 TABLET BY MOUTH DAILY AS NEEDED FOR FLUID. 30 tablet 0   Misc.  Devices (TRANSFER BOARD) MISC Use as needed for transfer from wheelchair 1 each 0   natalizumab (TYSABRI) 300 MG/15ML injection Inject into the vein.     oxybutynin (DITROPAN) 5 MG tablet Take 1 tablet by mouth 2 (two) times daily.     No current facility-administered medications for this visit.    No Known Allergies  REVIEW OF SYSTEMS:   '[X]'$  denotes positive finding, '[ ]'$  denotes negative finding Cardiac  Comments:  Chest pain or chest pressure:    Shortness of breath upon exertion:    Short of breath when lying flat:    Irregular heart rhythm:        Vascular    Pain in calf, thigh, or hip brought on by ambulation:    Pain in feet at night that wakes you up from your sleep:     Blood clot in your veins:    Leg swelling:  x       Pulmonary    Oxygen at home:    Productive cough:     Wheezing:         Neurologic    Sudden weakness in arms or legs:     Sudden numbness in arms or legs:     Sudden onset of difficulty speaking or slurred speech:    Temporary loss of vision in one eye:     Problems with dizziness:         Gastrointestinal    Blood in stool:     Vomited blood:         Genitourinary    Burning when urinating:     Blood in urine:        Psychiatric    Major depression:         Hematologic    Bleeding problems:    Problems with blood clotting too easily:        Skin    Rashes or ulcers:        Constitutional    Fever or chills:      PHYSICAL EXAMINATION:  Today's Vitals   03/09/23 1354  BP: 103/69  Pulse: 73  Temp: 98.2 F (36.8 C)  TempSrc: Temporal  SpO2: 99%   There is no height or weight on file to calculate BMI.   General:  WDWN in NAD; vital  signs documented above Gait: Not observed HENT: WNL, normocephalic Pulmonary: normal non-labored breathing without wheezing Cardiac: regular HR; without carotid bruits Skin: without rashes Vascular Exam/Pulses:  Right Left  Radial 2+ (normal) 2+ (normal)  DP 2+ (normal) 2+ (normal)    Extremities: BLE swelling with left > right  Neurologic: A&O X 3;  moving all extremities equally Psychiatric:  The pt has Normal affect.   Non-Invasive Vascular Imaging:   Venous duplex: Unable to perform due to pt not able to stand    SANDOR TAORMINA is a 47 y.o. male who presents with: BLE swelling with left > right    -pt has easily palpable DP pedal pulses bilaterally -pt was unable to get u/s today to evaluate venous insufficiency.  He has leg swelling with the left worse than right that improves with elevation and is not bothersome to him.  He does not have any wounds on his legs or feet.  Discussed that since this is not bothersome to him and improves with elevation and compression, we will continue with leg elevation and his compression socks.  If the swelling worsens, we can re-evaluate at that time.   -discussed with pt about continuing to wear his compression socks -discussed the importance of leg elevation and how to elevate properly - pt is advised to elevate their legs and a diagram is given to them to demonstrate for pt to lay flat on their back with knees elevated and slightly bent with their feet higher than their knees, which puts their feet higher than their heart for 15 minutes per day.  If pt cannot lay flat, advised to lay as flat as possible.  -pt is unable to walk but encouraged him to exercise his legs as much as possible -handout with recommendations given -discussed the importance of smoking cessation -pt will f/u as needed   Leontine Locket, [facility] [phone]  Clinic MD:  Carlis Abbott

## 2023-03-10 ENCOUNTER — Ambulatory Visit (INDEPENDENT_AMBULATORY_CARE_PROVIDER_SITE_OTHER): Payer: 59 | Admitting: Physician Assistant

## 2023-03-10 ENCOUNTER — Encounter: Payer: Self-pay | Admitting: Physician Assistant

## 2023-03-10 VITALS — BP 120/70 | HR 76 | Temp 98.6°F | Ht 66.0 in | Wt 117.5 lb

## 2023-03-10 DIAGNOSIS — Z5989 Other problems related to housing and economic circumstances: Secondary | ICD-10-CM

## 2023-03-10 DIAGNOSIS — M7989 Other specified soft tissue disorders: Secondary | ICD-10-CM | POA: Diagnosis not present

## 2023-03-10 DIAGNOSIS — N489 Disorder of penis, unspecified: Secondary | ICD-10-CM | POA: Diagnosis not present

## 2023-03-10 LAB — BASIC METABOLIC PANEL
BUN: 19 mg/dL (ref 6–23)
CO2: 34 mEq/L — ABNORMAL HIGH (ref 19–32)
Calcium: 10.2 mg/dL (ref 8.4–10.5)
Chloride: 100 mEq/L (ref 96–112)
Creatinine, Ser: 1.21 mg/dL (ref 0.40–1.50)
GFR: 71.38 mL/min (ref >60.00)
Glucose, Bld: 95 mg/dL (ref 70–99)
Potassium: 4.5 mEq/L (ref 3.5–5.1)
Sodium: 141 mEq/L (ref 135–145)

## 2023-03-10 MED ORDER — FUROSEMIDE 20 MG PO TABS: 20.0000 mg | ORAL_TABLET | Freq: Every day | ORAL | 0 refills | Status: DC | PRN

## 2023-03-10 NOTE — Patient Instructions (Signed)
It was great to see you!  Take lasix as needed  Please find someone more suitable to be a part of your care.  Come see me in 3 months  Take care,  [name] PA-C

## 2023-03-12 DIAGNOSIS — G35 Multiple sclerosis: Secondary | ICD-10-CM | POA: Diagnosis not present

## 2023-04-09 DIAGNOSIS — G35 Multiple sclerosis: Secondary | ICD-10-CM | POA: Diagnosis not present

## 2023-05-07 DIAGNOSIS — G35 Multiple sclerosis: Secondary | ICD-10-CM | POA: Diagnosis not present

## 2023-06-03 DIAGNOSIS — R531 Weakness: Secondary | ICD-10-CM | POA: Insufficient documentation

## 2023-06-03 DIAGNOSIS — G35 Multiple sclerosis: Secondary | ICD-10-CM | POA: Diagnosis not present

## 2023-06-03 DIAGNOSIS — Z993 Dependence on wheelchair: Secondary | ICD-10-CM | POA: Diagnosis not present

## 2023-06-03 DIAGNOSIS — Z7409 Other reduced mobility: Secondary | ICD-10-CM | POA: Insufficient documentation

## 2023-06-04 DIAGNOSIS — G35 Multiple sclerosis: Secondary | ICD-10-CM | POA: Diagnosis not present

## 2023-06-10 ENCOUNTER — Ambulatory Visit: Payer: 59 | Admitting: Physician Assistant

## 2023-06-21 ENCOUNTER — Ambulatory Visit (INDEPENDENT_AMBULATORY_CARE_PROVIDER_SITE_OTHER): Payer: 59 | Admitting: Physician Assistant

## 2023-06-21 ENCOUNTER — Encounter: Payer: Self-pay | Admitting: Physician Assistant

## 2023-06-21 VITALS — BP 110/70 | HR 58 | Temp 97.3°F | Ht 66.0 in

## 2023-06-21 DIAGNOSIS — M7989 Other specified soft tissue disorders: Secondary | ICD-10-CM

## 2023-06-21 DIAGNOSIS — G35 Multiple sclerosis: Secondary | ICD-10-CM | POA: Diagnosis not present

## 2023-06-21 DIAGNOSIS — R262 Difficulty in walking, not elsewhere classified: Secondary | ICD-10-CM | POA: Diagnosis not present

## 2023-06-21 MED ORDER — OXYBUTYNIN CHLORIDE 5 MG PO TABS: 5.0000 mg | ORAL_TABLET | Freq: Two times a day (BID) | ORAL | 5 refills | Status: AC

## 2023-06-21 MED ORDER — BACLOFEN 10 MG PO TABS: 10.0000 mg | ORAL_TABLET | Freq: Three times a day (TID) | ORAL | 5 refills | Status: DC

## 2023-06-21 MED ORDER — FUROSEMIDE 20 MG PO TABS: 20.0000 mg | ORAL_TABLET | Freq: Every day | ORAL | 0 refills | Status: DC | PRN

## 2023-06-21 NOTE — Patient Instructions (Signed)
It was great to see you!  We will restart home health PT  Let's follow-up in 6 months to 1 year, sooner if you have concerns.  Take care,  Jarold Motto PA-C

## 2023-06-21 NOTE — Progress Notes (Signed)
Nathan Marsh is a 47 y.o. male here for a follow up of a pre-existing problem.  History of Present Illness:   Chief Complaint  Patient presents with   Multiple Sclerosis    HPI  Multiple Sclerosis: Would like to review his need for 24 hour medical assistance to allow for a break between 8:30PM-12AM because he hasn't had any recent emergencies and has contacts available. Managed with 10 mg Baclofen, oxybutynin 5 mg daily, and 15 ml injectable Tysabri.  On 06/03/23 he received and was fitted for new power wheelchair.  Headrest not properly fitted according to wife and medical aid. He is open to starting physical therapy. His brother recently moved in with him to help provide care  Lower extremity swelling Taking 20 mg Lasix as needed Caregiver reports he has been wearing his compression stockings, but notes concerning compression lines and swelling above socks when removed.  Past Medical History:  Diagnosis Date   Multiple sclerosis ([facility])      Social History   Tobacco Use   Smoking status: Every Day    Packs/day: .5    Types: Cigarettes    Start date: 05/31/2000   Smokeless tobacco: Never  Vaping Use   Vaping Use: Never used  Substance Use Topics   Alcohol use: Yes    Comment: very limited   Drug use: Yes    Types: Marijuana    Past Surgical History:  Procedure Laterality Date   AMPUTATION Left 04/27/2017   Procedure: AMPUTATION DIGIT REVISION LEFT MIDDLE FINGER;  Surgeon: Knute Neu, MD;  [facility];  Service: Orthopedics;  Laterality: Left;    Family History  Problem Relation Age of Onset   Cancer Mother        lung    No Known Allergies  Current Medications:   Current Outpatient Medications:    Misc. Devices (TRANSFER BOARD) MISC, Use as needed for transfer from wheelchair, Disp: 1 each, Rfl: 0   natalizumab (TYSABRI) 300 MG/15ML injection, Inject into the vein., Disp: , Rfl:    baclofen (LIORESAL) 10 MG tablet, Take 1 tablet (10 mg total) by  mouth 3 (three) times daily., Disp: 30 each, Rfl: 5   furosemide (LASIX) 20 MG tablet, Take 1 tablet (20 mg total) by mouth daily as needed for edema., Disp: 30 tablet, Rfl: 0   oxybutynin (DITROPAN) 5 MG tablet, Take 1 tablet (5 mg total) by mouth 2 (two) times daily., Disp: 60 tablet, Rfl: 5   Review of Systems:   ROS  Vitals:   Vitals:   06/21/23 1057  BP: 110/70  Pulse: (!) 58  Temp: (!) 97.3 F (36.3 C)  TempSrc: Temporal  SpO2: 97%  Height: 5\' 6"  (1.676 m)     Body mass index is 18.96 kg/m.  Physical Exam:   Physical Exam Vitals and nursing note reviewed.  Constitutional:      General: He is not in acute distress.    Appearance: He is well-developed. He is not ill-appearing or toxic-appearing.  Cardiovascular:     Rate and Rhythm: Normal rate and regular rhythm.     Pulses: Normal pulses.     Heart sounds: Normal heart sounds, S1 normal and S2 normal.  Pulmonary:     Effort: Pulmonary effort is normal.     Breath sounds: Normal breath sounds.  Musculoskeletal:     Right lower leg: 1+ Edema present.     Left lower leg: 1+ Edema present.     Comments: No obvious  calf tenderness/erythema  Skin:    General: Skin is warm and dry.  Neurological:     Mental Status: He is alert.     GCS: GCS eye subscore is 4. GCS verbal subscore is 5. GCS motor subscore is 6.  Psychiatric:        Speech: Speech normal.        Behavior: Behavior normal. Behavior is cooperative.     Assessment and Plan:   Bilateral swelling of feet Improved since last visit Encouraged him to elevate feet with power wheelchair Continue to utilize compression stockings and as needed Lasix 20 mg Follow-up prn  Unable to walk Doing well with new power wheelchair Will order home health physical therapy for further evaluation He is currently not getting consistent care between the hours of 8:30p-12a. I discussed that if we re-start physical therapy and he remains without falls that this is  acceptable, however if there are any declines in his health, we will need to revisit this. He does have contact with an aide 24/7 who lives 5 minutes from his home in emergencies.  Multiple sclerosis ([facility]) Compliant with care from neurology  I,Emily Lagle,acting as a scribe for Jarold Motto, PA.,have documented all relevant documentation on the behalf of Jarold Motto, PA,as directed by  Jarold Motto, PA while in the presence of Jarold Motto, [name].  I, Jarold Motto, [name], have reviewed all documentation for this visit. The documentation on 06/21/23 for the exam, diagnosis, procedures, and orders are all accurate and complete.   Jarold Motto, PA-C

## 2023-07-02 DIAGNOSIS — G35 Multiple sclerosis: Secondary | ICD-10-CM | POA: Diagnosis not present

## 2023-07-07 DIAGNOSIS — Z452 Encounter for adjustment and management of vascular access device: Secondary | ICD-10-CM | POA: Diagnosis not present

## 2023-07-07 DIAGNOSIS — F17210 Nicotine dependence, cigarettes, uncomplicated: Secondary | ICD-10-CM | POA: Diagnosis not present

## 2023-07-07 DIAGNOSIS — G35 Multiple sclerosis: Secondary | ICD-10-CM | POA: Diagnosis not present

## 2023-07-07 DIAGNOSIS — Z604 Social exclusion and rejection: Secondary | ICD-10-CM | POA: Diagnosis not present

## 2023-07-07 DIAGNOSIS — R2243 Localized swelling, mass and lump, lower limb, bilateral: Secondary | ICD-10-CM | POA: Diagnosis not present

## 2023-07-07 DIAGNOSIS — Z79899 Other long term (current) drug therapy: Secondary | ICD-10-CM | POA: Diagnosis not present

## 2023-07-07 DIAGNOSIS — Z89022 Acquired absence of left finger(s): Secondary | ICD-10-CM | POA: Diagnosis not present

## 2023-07-07 DIAGNOSIS — Z993 Dependence on wheelchair: Secondary | ICD-10-CM | POA: Diagnosis not present

## 2023-07-13 DIAGNOSIS — G35 Multiple sclerosis: Secondary | ICD-10-CM | POA: Diagnosis not present

## 2023-07-13 DIAGNOSIS — F17210 Nicotine dependence, cigarettes, uncomplicated: Secondary | ICD-10-CM | POA: Diagnosis not present

## 2023-07-13 DIAGNOSIS — Z79899 Other long term (current) drug therapy: Secondary | ICD-10-CM | POA: Diagnosis not present

## 2023-07-13 DIAGNOSIS — Z993 Dependence on wheelchair: Secondary | ICD-10-CM | POA: Diagnosis not present

## 2023-07-13 DIAGNOSIS — Z452 Encounter for adjustment and management of vascular access device: Secondary | ICD-10-CM | POA: Diagnosis not present

## 2023-07-13 DIAGNOSIS — Z89022 Acquired absence of left finger(s): Secondary | ICD-10-CM | POA: Diagnosis not present

## 2023-07-13 DIAGNOSIS — Z604 Social exclusion and rejection: Secondary | ICD-10-CM | POA: Diagnosis not present

## 2023-07-13 DIAGNOSIS — R2243 Localized swelling, mass and lump, lower limb, bilateral: Secondary | ICD-10-CM | POA: Diagnosis not present

## 2023-07-16 DIAGNOSIS — G35 Multiple sclerosis: Secondary | ICD-10-CM | POA: Diagnosis not present

## 2023-07-16 DIAGNOSIS — R2243 Localized swelling, mass and lump, lower limb, bilateral: Secondary | ICD-10-CM | POA: Diagnosis not present

## 2023-07-16 DIAGNOSIS — F17210 Nicotine dependence, cigarettes, uncomplicated: Secondary | ICD-10-CM | POA: Diagnosis not present

## 2023-07-16 DIAGNOSIS — Z89022 Acquired absence of left finger(s): Secondary | ICD-10-CM | POA: Diagnosis not present

## 2023-07-16 DIAGNOSIS — Z79899 Other long term (current) drug therapy: Secondary | ICD-10-CM | POA: Diagnosis not present

## 2023-07-16 DIAGNOSIS — Z452 Encounter for adjustment and management of vascular access device: Secondary | ICD-10-CM | POA: Diagnosis not present

## 2023-07-16 DIAGNOSIS — Z993 Dependence on wheelchair: Secondary | ICD-10-CM | POA: Diagnosis not present

## 2023-07-16 DIAGNOSIS — Z604 Social exclusion and rejection: Secondary | ICD-10-CM | POA: Diagnosis not present

## 2023-07-20 DIAGNOSIS — F17210 Nicotine dependence, cigarettes, uncomplicated: Secondary | ICD-10-CM | POA: Diagnosis not present

## 2023-07-20 DIAGNOSIS — Z452 Encounter for adjustment and management of vascular access device: Secondary | ICD-10-CM | POA: Diagnosis not present

## 2023-07-20 DIAGNOSIS — R2243 Localized swelling, mass and lump, lower limb, bilateral: Secondary | ICD-10-CM | POA: Diagnosis not present

## 2023-07-20 DIAGNOSIS — Z89022 Acquired absence of left finger(s): Secondary | ICD-10-CM | POA: Diagnosis not present

## 2023-07-20 DIAGNOSIS — G35 Multiple sclerosis: Secondary | ICD-10-CM | POA: Diagnosis not present

## 2023-07-20 DIAGNOSIS — Z993 Dependence on wheelchair: Secondary | ICD-10-CM | POA: Diagnosis not present

## 2023-07-20 DIAGNOSIS — Z79899 Other long term (current) drug therapy: Secondary | ICD-10-CM | POA: Diagnosis not present

## 2023-07-20 DIAGNOSIS — Z604 Social exclusion and rejection: Secondary | ICD-10-CM | POA: Diagnosis not present

## 2023-07-21 DIAGNOSIS — Z89022 Acquired absence of left finger(s): Secondary | ICD-10-CM | POA: Diagnosis not present

## 2023-07-21 DIAGNOSIS — R2243 Localized swelling, mass and lump, lower limb, bilateral: Secondary | ICD-10-CM | POA: Diagnosis not present

## 2023-07-21 DIAGNOSIS — Z79899 Other long term (current) drug therapy: Secondary | ICD-10-CM | POA: Diagnosis not present

## 2023-07-21 DIAGNOSIS — F17210 Nicotine dependence, cigarettes, uncomplicated: Secondary | ICD-10-CM | POA: Diagnosis not present

## 2023-07-21 DIAGNOSIS — Z993 Dependence on wheelchair: Secondary | ICD-10-CM | POA: Diagnosis not present

## 2023-07-21 DIAGNOSIS — Z452 Encounter for adjustment and management of vascular access device: Secondary | ICD-10-CM | POA: Diagnosis not present

## 2023-07-21 DIAGNOSIS — G35 Multiple sclerosis: Secondary | ICD-10-CM | POA: Diagnosis not present

## 2023-07-21 DIAGNOSIS — Z604 Social exclusion and rejection: Secondary | ICD-10-CM | POA: Diagnosis not present

## 2023-07-22 ENCOUNTER — Telehealth: Payer: Self-pay | Admitting: Physician Assistant

## 2023-07-22 ENCOUNTER — Encounter: Payer: Self-pay | Admitting: Physician Assistant

## 2023-07-22 NOTE — Telephone Encounter (Signed)
Nathan Marsh with [facility] requests PCP put in writing that Patient have alone time to himself at evening/night.  Nathan requests to be called with status of request.

## 2023-07-22 NOTE — Telephone Encounter (Signed)
Letter completed and put at the front desk.

## 2023-07-22 NOTE — Telephone Encounter (Signed)
Left message on voicemail to call office.  

## 2023-07-22 NOTE — Telephone Encounter (Signed)
Eston Esters called back told her [name] said as long as patient has not had any falls he can be alone from 8:30 PM to 12:00 AM. Told her she wrote that in his last AVS. [name] verbalized understanding and said the social worker needs it in writing. Told her I can write a letter and put it at the front desk for you to pick up later. [name] verbalized understanding and said yes, that would be good. Told her okay.

## 2023-07-26 ENCOUNTER — Ambulatory Visit: Payer: Medicare Other

## 2023-07-26 DIAGNOSIS — Z452 Encounter for adjustment and management of vascular access device: Secondary | ICD-10-CM | POA: Diagnosis not present

## 2023-07-26 DIAGNOSIS — G35 Multiple sclerosis: Secondary | ICD-10-CM | POA: Diagnosis not present

## 2023-07-26 DIAGNOSIS — Z89022 Acquired absence of left finger(s): Secondary | ICD-10-CM | POA: Diagnosis not present

## 2023-07-26 DIAGNOSIS — Z604 Social exclusion and rejection: Secondary | ICD-10-CM | POA: Diagnosis not present

## 2023-07-26 DIAGNOSIS — Z993 Dependence on wheelchair: Secondary | ICD-10-CM | POA: Diagnosis not present

## 2023-07-26 DIAGNOSIS — F17210 Nicotine dependence, cigarettes, uncomplicated: Secondary | ICD-10-CM | POA: Diagnosis not present

## 2023-07-26 DIAGNOSIS — Z79899 Other long term (current) drug therapy: Secondary | ICD-10-CM | POA: Diagnosis not present

## 2023-07-26 DIAGNOSIS — R2243 Localized swelling, mass and lump, lower limb, bilateral: Secondary | ICD-10-CM | POA: Diagnosis not present

## 2023-07-26 NOTE — Progress Notes (Signed)
Pt declined at this time wants to be rescheduled at a later date

## 2023-07-27 DIAGNOSIS — Z79899 Other long term (current) drug therapy: Secondary | ICD-10-CM | POA: Diagnosis not present

## 2023-07-27 DIAGNOSIS — F17210 Nicotine dependence, cigarettes, uncomplicated: Secondary | ICD-10-CM | POA: Diagnosis not present

## 2023-07-27 DIAGNOSIS — R2243 Localized swelling, mass and lump, lower limb, bilateral: Secondary | ICD-10-CM | POA: Diagnosis not present

## 2023-07-27 DIAGNOSIS — Z604 Social exclusion and rejection: Secondary | ICD-10-CM | POA: Diagnosis not present

## 2023-07-27 DIAGNOSIS — Z89022 Acquired absence of left finger(s): Secondary | ICD-10-CM | POA: Diagnosis not present

## 2023-07-27 DIAGNOSIS — Z993 Dependence on wheelchair: Secondary | ICD-10-CM | POA: Diagnosis not present

## 2023-07-27 DIAGNOSIS — Z452 Encounter for adjustment and management of vascular access device: Secondary | ICD-10-CM | POA: Diagnosis not present

## 2023-07-27 DIAGNOSIS — G35 Multiple sclerosis: Secondary | ICD-10-CM | POA: Diagnosis not present

## 2023-07-30 DIAGNOSIS — G35 Multiple sclerosis: Secondary | ICD-10-CM | POA: Diagnosis not present

## 2023-08-05 DIAGNOSIS — Z604 Social exclusion and rejection: Secondary | ICD-10-CM | POA: Diagnosis not present

## 2023-08-05 DIAGNOSIS — Z452 Encounter for adjustment and management of vascular access device: Secondary | ICD-10-CM | POA: Diagnosis not present

## 2023-08-05 DIAGNOSIS — Z993 Dependence on wheelchair: Secondary | ICD-10-CM | POA: Diagnosis not present

## 2023-08-05 DIAGNOSIS — F17210 Nicotine dependence, cigarettes, uncomplicated: Secondary | ICD-10-CM | POA: Diagnosis not present

## 2023-08-05 DIAGNOSIS — R2243 Localized swelling, mass and lump, lower limb, bilateral: Secondary | ICD-10-CM | POA: Diagnosis not present

## 2023-08-05 DIAGNOSIS — Z89022 Acquired absence of left finger(s): Secondary | ICD-10-CM | POA: Diagnosis not present

## 2023-08-05 DIAGNOSIS — G35 Multiple sclerosis: Secondary | ICD-10-CM | POA: Diagnosis not present

## 2023-08-05 DIAGNOSIS — Z79899 Other long term (current) drug therapy: Secondary | ICD-10-CM | POA: Diagnosis not present

## 2023-08-11 DIAGNOSIS — R2243 Localized swelling, mass and lump, lower limb, bilateral: Secondary | ICD-10-CM | POA: Diagnosis not present

## 2023-08-11 DIAGNOSIS — G35 Multiple sclerosis: Secondary | ICD-10-CM | POA: Diagnosis not present

## 2023-08-11 DIAGNOSIS — Z452 Encounter for adjustment and management of vascular access device: Secondary | ICD-10-CM | POA: Diagnosis not present

## 2023-08-11 DIAGNOSIS — Z993 Dependence on wheelchair: Secondary | ICD-10-CM | POA: Diagnosis not present

## 2023-08-11 DIAGNOSIS — F17210 Nicotine dependence, cigarettes, uncomplicated: Secondary | ICD-10-CM | POA: Diagnosis not present

## 2023-08-11 DIAGNOSIS — Z604 Social exclusion and rejection: Secondary | ICD-10-CM | POA: Diagnosis not present

## 2023-08-11 DIAGNOSIS — Z89022 Acquired absence of left finger(s): Secondary | ICD-10-CM | POA: Diagnosis not present

## 2023-08-11 DIAGNOSIS — Z79899 Other long term (current) drug therapy: Secondary | ICD-10-CM | POA: Diagnosis not present

## 2023-08-17 DIAGNOSIS — Z89022 Acquired absence of left finger(s): Secondary | ICD-10-CM | POA: Diagnosis not present

## 2023-08-17 DIAGNOSIS — G35 Multiple sclerosis: Secondary | ICD-10-CM | POA: Diagnosis not present

## 2023-08-17 DIAGNOSIS — Z452 Encounter for adjustment and management of vascular access device: Secondary | ICD-10-CM | POA: Diagnosis not present

## 2023-08-17 DIAGNOSIS — R2243 Localized swelling, mass and lump, lower limb, bilateral: Secondary | ICD-10-CM | POA: Diagnosis not present

## 2023-08-17 DIAGNOSIS — Z993 Dependence on wheelchair: Secondary | ICD-10-CM | POA: Diagnosis not present

## 2023-08-17 DIAGNOSIS — F17210 Nicotine dependence, cigarettes, uncomplicated: Secondary | ICD-10-CM | POA: Diagnosis not present

## 2023-08-17 DIAGNOSIS — Z604 Social exclusion and rejection: Secondary | ICD-10-CM | POA: Diagnosis not present

## 2023-08-17 DIAGNOSIS — Z79899 Other long term (current) drug therapy: Secondary | ICD-10-CM | POA: Diagnosis not present

## 2023-08-19 DIAGNOSIS — G35 Multiple sclerosis: Secondary | ICD-10-CM | POA: Diagnosis not present

## 2023-08-19 DIAGNOSIS — R531 Weakness: Secondary | ICD-10-CM | POA: Diagnosis not present

## 2023-08-24 DIAGNOSIS — G35 Multiple sclerosis: Secondary | ICD-10-CM | POA: Diagnosis not present

## 2023-08-24 DIAGNOSIS — Z993 Dependence on wheelchair: Secondary | ICD-10-CM | POA: Diagnosis not present

## 2023-08-24 DIAGNOSIS — Z452 Encounter for adjustment and management of vascular access device: Secondary | ICD-10-CM | POA: Diagnosis not present

## 2023-08-24 DIAGNOSIS — Z604 Social exclusion and rejection: Secondary | ICD-10-CM | POA: Diagnosis not present

## 2023-08-24 DIAGNOSIS — F17210 Nicotine dependence, cigarettes, uncomplicated: Secondary | ICD-10-CM | POA: Diagnosis not present

## 2023-08-24 DIAGNOSIS — R2243 Localized swelling, mass and lump, lower limb, bilateral: Secondary | ICD-10-CM | POA: Diagnosis not present

## 2023-08-24 DIAGNOSIS — Z89022 Acquired absence of left finger(s): Secondary | ICD-10-CM | POA: Diagnosis not present

## 2023-08-24 DIAGNOSIS — Z79899 Other long term (current) drug therapy: Secondary | ICD-10-CM | POA: Diagnosis not present

## 2023-08-27 DIAGNOSIS — G35 Multiple sclerosis: Secondary | ICD-10-CM | POA: Diagnosis not present

## 2023-09-02 ENCOUNTER — Telehealth: Payer: Self-pay | Admitting: Physician Assistant

## 2023-09-02 DIAGNOSIS — Z604 Social exclusion and rejection: Secondary | ICD-10-CM | POA: Diagnosis not present

## 2023-09-02 DIAGNOSIS — Z993 Dependence on wheelchair: Secondary | ICD-10-CM | POA: Diagnosis not present

## 2023-09-02 DIAGNOSIS — R2243 Localized swelling, mass and lump, lower limb, bilateral: Secondary | ICD-10-CM | POA: Diagnosis not present

## 2023-09-02 DIAGNOSIS — Z79899 Other long term (current) drug therapy: Secondary | ICD-10-CM | POA: Diagnosis not present

## 2023-09-02 DIAGNOSIS — Z89022 Acquired absence of left finger(s): Secondary | ICD-10-CM | POA: Diagnosis not present

## 2023-09-02 DIAGNOSIS — Z452 Encounter for adjustment and management of vascular access device: Secondary | ICD-10-CM | POA: Diagnosis not present

## 2023-09-02 DIAGNOSIS — F17210 Nicotine dependence, cigarettes, uncomplicated: Secondary | ICD-10-CM | POA: Diagnosis not present

## 2023-09-02 DIAGNOSIS — G35 Multiple sclerosis: Secondary | ICD-10-CM | POA: Diagnosis not present

## 2023-09-02 NOTE — Telephone Encounter (Signed)
Home Health Verbal Orders  Agency:  [facility]  Caller:  Enrique Sack  Reason for Request:  Extend HH PT, 1 a week for 8 weeks, for coordination and strength  Frequency:    HH needs F2F w/in last 30 days     [phone]

## 2023-09-03 NOTE — Telephone Encounter (Signed)
Spoke to [place] with [facility], verbal order given to Extend [facility] PT, 1 a week for 8 weeks, for coordination and strength okay per [facility]. Enrique Sack verbalized understanding.

## 2023-09-06 ENCOUNTER — Telehealth: Payer: Self-pay | Admitting: Physician Assistant

## 2023-09-06 NOTE — Telephone Encounter (Signed)
Received faxed document Home Health Certificate (Order ID [id number]), to be filled out by provider. Patient requested to send it back via Fax  Document is located in providers tray at front office.Please advise

## 2023-09-07 DIAGNOSIS — Z993 Dependence on wheelchair: Secondary | ICD-10-CM | POA: Diagnosis not present

## 2023-09-07 DIAGNOSIS — F17210 Nicotine dependence, cigarettes, uncomplicated: Secondary | ICD-10-CM | POA: Diagnosis not present

## 2023-09-07 DIAGNOSIS — Z604 Social exclusion and rejection: Secondary | ICD-10-CM | POA: Diagnosis not present

## 2023-09-07 DIAGNOSIS — Z89022 Acquired absence of left finger(s): Secondary | ICD-10-CM | POA: Diagnosis not present

## 2023-09-07 DIAGNOSIS — Z452 Encounter for adjustment and management of vascular access device: Secondary | ICD-10-CM | POA: Diagnosis not present

## 2023-09-07 DIAGNOSIS — G35 Multiple sclerosis: Secondary | ICD-10-CM | POA: Diagnosis not present

## 2023-09-07 DIAGNOSIS — R2243 Localized swelling, mass and lump, lower limb, bilateral: Secondary | ICD-10-CM | POA: Diagnosis not present

## 2023-09-07 NOTE — Telephone Encounter (Signed)
Papers signed and faxed. 

## 2023-09-24 DIAGNOSIS — G35 Multiple sclerosis: Secondary | ICD-10-CM | POA: Diagnosis not present

## 2023-09-30 DIAGNOSIS — Z89022 Acquired absence of left finger(s): Secondary | ICD-10-CM | POA: Diagnosis not present

## 2023-09-30 DIAGNOSIS — G35 Multiple sclerosis: Secondary | ICD-10-CM | POA: Diagnosis not present

## 2023-09-30 DIAGNOSIS — F17210 Nicotine dependence, cigarettes, uncomplicated: Secondary | ICD-10-CM | POA: Diagnosis not present

## 2023-09-30 DIAGNOSIS — Z452 Encounter for adjustment and management of vascular access device: Secondary | ICD-10-CM | POA: Diagnosis not present

## 2023-09-30 DIAGNOSIS — Z993 Dependence on wheelchair: Secondary | ICD-10-CM | POA: Diagnosis not present

## 2023-09-30 DIAGNOSIS — Z604 Social exclusion and rejection: Secondary | ICD-10-CM | POA: Diagnosis not present

## 2023-09-30 DIAGNOSIS — R2243 Localized swelling, mass and lump, lower limb, bilateral: Secondary | ICD-10-CM | POA: Diagnosis not present

## 2023-10-01 ENCOUNTER — Telehealth: Payer: Self-pay | Admitting: Physician Assistant

## 2023-10-01 DIAGNOSIS — F17210 Nicotine dependence, cigarettes, uncomplicated: Secondary | ICD-10-CM | POA: Diagnosis not present

## 2023-10-01 DIAGNOSIS — Z89022 Acquired absence of left finger(s): Secondary | ICD-10-CM | POA: Diagnosis not present

## 2023-10-01 DIAGNOSIS — Z993 Dependence on wheelchair: Secondary | ICD-10-CM | POA: Diagnosis not present

## 2023-10-01 DIAGNOSIS — Z452 Encounter for adjustment and management of vascular access device: Secondary | ICD-10-CM | POA: Diagnosis not present

## 2023-10-01 DIAGNOSIS — G35 Multiple sclerosis: Secondary | ICD-10-CM | POA: Diagnosis not present

## 2023-10-01 DIAGNOSIS — Z604 Social exclusion and rejection: Secondary | ICD-10-CM | POA: Diagnosis not present

## 2023-10-01 DIAGNOSIS — R2243 Localized swelling, mass and lump, lower limb, bilateral: Secondary | ICD-10-CM | POA: Diagnosis not present

## 2023-10-01 NOTE — Telephone Encounter (Signed)
Home Health Verbal Orders  Agency:  [facility]  Caller:  Mariann Laster and title  Requesting OT/ PT/ Skilled nursing/ Social Work/ Speech:    Reason for Request:  Faxing paperwork for sit to stand list for insurance - possible appt, could be virtual - between 10am and 12noon  Frequency:    HH needs F2F w/in last 30 days     [phone]

## 2023-10-05 NOTE — Telephone Encounter (Signed)
Received orders via fax. Orders signed by [name] and [facility]ed to [facility].

## 2023-10-06 DIAGNOSIS — Z452 Encounter for adjustment and management of vascular access device: Secondary | ICD-10-CM | POA: Diagnosis not present

## 2023-10-06 DIAGNOSIS — Z89022 Acquired absence of left finger(s): Secondary | ICD-10-CM | POA: Diagnosis not present

## 2023-10-06 DIAGNOSIS — Z604 Social exclusion and rejection: Secondary | ICD-10-CM | POA: Diagnosis not present

## 2023-10-06 DIAGNOSIS — Z993 Dependence on wheelchair: Secondary | ICD-10-CM | POA: Diagnosis not present

## 2023-10-06 DIAGNOSIS — G35 Multiple sclerosis: Secondary | ICD-10-CM | POA: Diagnosis not present

## 2023-10-06 DIAGNOSIS — F17210 Nicotine dependence, cigarettes, uncomplicated: Secondary | ICD-10-CM | POA: Diagnosis not present

## 2023-10-06 DIAGNOSIS — R2243 Localized swelling, mass and lump, lower limb, bilateral: Secondary | ICD-10-CM | POA: Diagnosis not present

## 2023-10-12 DIAGNOSIS — Z604 Social exclusion and rejection: Secondary | ICD-10-CM | POA: Diagnosis not present

## 2023-10-12 DIAGNOSIS — Z993 Dependence on wheelchair: Secondary | ICD-10-CM | POA: Diagnosis not present

## 2023-10-12 DIAGNOSIS — F17210 Nicotine dependence, cigarettes, uncomplicated: Secondary | ICD-10-CM | POA: Diagnosis not present

## 2023-10-12 DIAGNOSIS — Z452 Encounter for adjustment and management of vascular access device: Secondary | ICD-10-CM | POA: Diagnosis not present

## 2023-10-12 DIAGNOSIS — Z89022 Acquired absence of left finger(s): Secondary | ICD-10-CM | POA: Diagnosis not present

## 2023-10-12 DIAGNOSIS — G35 Multiple sclerosis: Secondary | ICD-10-CM | POA: Diagnosis not present

## 2023-10-12 DIAGNOSIS — R2243 Localized swelling, mass and lump, lower limb, bilateral: Secondary | ICD-10-CM | POA: Diagnosis not present

## 2023-10-18 ENCOUNTER — Telehealth: Payer: Self-pay | Admitting: Physician Assistant

## 2023-10-18 DIAGNOSIS — F17210 Nicotine dependence, cigarettes, uncomplicated: Secondary | ICD-10-CM | POA: Diagnosis not present

## 2023-10-18 DIAGNOSIS — Z452 Encounter for adjustment and management of vascular access device: Secondary | ICD-10-CM | POA: Diagnosis not present

## 2023-10-18 DIAGNOSIS — G35 Multiple sclerosis: Secondary | ICD-10-CM | POA: Diagnosis not present

## 2023-10-18 DIAGNOSIS — Z993 Dependence on wheelchair: Secondary | ICD-10-CM | POA: Diagnosis not present

## 2023-10-18 DIAGNOSIS — Z89022 Acquired absence of left finger(s): Secondary | ICD-10-CM | POA: Diagnosis not present

## 2023-10-18 DIAGNOSIS — R2243 Localized swelling, mass and lump, lower limb, bilateral: Secondary | ICD-10-CM | POA: Diagnosis not present

## 2023-10-18 DIAGNOSIS — Z604 Social exclusion and rejection: Secondary | ICD-10-CM | POA: Diagnosis not present

## 2023-10-18 NOTE — Telephone Encounter (Signed)
Home Health Verbal Orders  Agency:  [facility]  Caller: Kendall Flack and title  Requesting OT/ PT/ Skilled nursing/ Social Work/ Speech:    Reason for Request:  OT for 1 time a week for 3 weeks  Frequency:    HH needs F2F w/in last 30 days     [phone]

## 2023-10-18 NOTE — Telephone Encounter (Signed)
[facility] with [facility], verbal orders given for OT once a week x 3 weeks okay per [name]. Judeth Cornfield verbalized understanding.

## 2023-10-19 DIAGNOSIS — Z993 Dependence on wheelchair: Secondary | ICD-10-CM | POA: Diagnosis not present

## 2023-10-19 DIAGNOSIS — F17210 Nicotine dependence, cigarettes, uncomplicated: Secondary | ICD-10-CM | POA: Diagnosis not present

## 2023-10-19 DIAGNOSIS — Z452 Encounter for adjustment and management of vascular access device: Secondary | ICD-10-CM | POA: Diagnosis not present

## 2023-10-19 DIAGNOSIS — Z604 Social exclusion and rejection: Secondary | ICD-10-CM | POA: Diagnosis not present

## 2023-10-19 DIAGNOSIS — R2243 Localized swelling, mass and lump, lower limb, bilateral: Secondary | ICD-10-CM | POA: Diagnosis not present

## 2023-10-19 DIAGNOSIS — Z89022 Acquired absence of left finger(s): Secondary | ICD-10-CM | POA: Diagnosis not present

## 2023-10-19 DIAGNOSIS — G35 Multiple sclerosis: Secondary | ICD-10-CM | POA: Diagnosis not present

## 2023-10-21 DIAGNOSIS — Z89022 Acquired absence of left finger(s): Secondary | ICD-10-CM | POA: Diagnosis not present

## 2023-10-21 DIAGNOSIS — F17210 Nicotine dependence, cigarettes, uncomplicated: Secondary | ICD-10-CM | POA: Diagnosis not present

## 2023-10-21 DIAGNOSIS — R2243 Localized swelling, mass and lump, lower limb, bilateral: Secondary | ICD-10-CM | POA: Diagnosis not present

## 2023-10-21 DIAGNOSIS — G35 Multiple sclerosis: Secondary | ICD-10-CM | POA: Diagnosis not present

## 2023-10-21 DIAGNOSIS — Z993 Dependence on wheelchair: Secondary | ICD-10-CM | POA: Diagnosis not present

## 2023-10-21 DIAGNOSIS — Z452 Encounter for adjustment and management of vascular access device: Secondary | ICD-10-CM | POA: Diagnosis not present

## 2023-10-21 DIAGNOSIS — Z604 Social exclusion and rejection: Secondary | ICD-10-CM | POA: Diagnosis not present

## 2023-10-22 DIAGNOSIS — G35 Multiple sclerosis: Secondary | ICD-10-CM | POA: Diagnosis not present

## 2023-10-25 DIAGNOSIS — Z89022 Acquired absence of left finger(s): Secondary | ICD-10-CM | POA: Diagnosis not present

## 2023-10-25 DIAGNOSIS — G35 Multiple sclerosis: Secondary | ICD-10-CM | POA: Diagnosis not present

## 2023-10-25 DIAGNOSIS — R2243 Localized swelling, mass and lump, lower limb, bilateral: Secondary | ICD-10-CM | POA: Diagnosis not present

## 2023-10-25 DIAGNOSIS — Z452 Encounter for adjustment and management of vascular access device: Secondary | ICD-10-CM | POA: Diagnosis not present

## 2023-10-25 DIAGNOSIS — Z604 Social exclusion and rejection: Secondary | ICD-10-CM | POA: Diagnosis not present

## 2023-10-25 DIAGNOSIS — F17210 Nicotine dependence, cigarettes, uncomplicated: Secondary | ICD-10-CM | POA: Diagnosis not present

## 2023-10-25 DIAGNOSIS — Z993 Dependence on wheelchair: Secondary | ICD-10-CM | POA: Diagnosis not present

## 2023-10-26 DIAGNOSIS — R2243 Localized swelling, mass and lump, lower limb, bilateral: Secondary | ICD-10-CM | POA: Diagnosis not present

## 2023-10-26 DIAGNOSIS — Z993 Dependence on wheelchair: Secondary | ICD-10-CM | POA: Diagnosis not present

## 2023-10-26 DIAGNOSIS — Z604 Social exclusion and rejection: Secondary | ICD-10-CM | POA: Diagnosis not present

## 2023-10-26 DIAGNOSIS — Z452 Encounter for adjustment and management of vascular access device: Secondary | ICD-10-CM | POA: Diagnosis not present

## 2023-10-26 DIAGNOSIS — Z89022 Acquired absence of left finger(s): Secondary | ICD-10-CM | POA: Diagnosis not present

## 2023-10-26 DIAGNOSIS — G35 Multiple sclerosis: Secondary | ICD-10-CM | POA: Diagnosis not present

## 2023-10-26 DIAGNOSIS — F17210 Nicotine dependence, cigarettes, uncomplicated: Secondary | ICD-10-CM | POA: Diagnosis not present

## 2023-10-28 ENCOUNTER — Telehealth: Payer: Self-pay | Admitting: Physician Assistant

## 2023-10-28 NOTE — Telephone Encounter (Signed)
Received orders yesterday, [name] signed and faxed to [facility] today.

## 2023-10-28 NOTE — Telephone Encounter (Signed)
Luther Parody, order recovery with [facility], states she faxed a medical order for social services for PCP to sign off on. Due to not being able to confirm if this was received, I advised caller to re-fax. Caller verbalized understanding and will be re-faxing. Caller can be reached @ [phone] ext [phone] for additional information if needed.

## 2023-10-29 DIAGNOSIS — Z89022 Acquired absence of left finger(s): Secondary | ICD-10-CM | POA: Diagnosis not present

## 2023-10-29 DIAGNOSIS — R2243 Localized swelling, mass and lump, lower limb, bilateral: Secondary | ICD-10-CM | POA: Diagnosis not present

## 2023-10-29 DIAGNOSIS — G35 Multiple sclerosis: Secondary | ICD-10-CM | POA: Diagnosis not present

## 2023-10-29 DIAGNOSIS — Z452 Encounter for adjustment and management of vascular access device: Secondary | ICD-10-CM | POA: Diagnosis not present

## 2023-10-29 DIAGNOSIS — Z993 Dependence on wheelchair: Secondary | ICD-10-CM | POA: Diagnosis not present

## 2023-10-29 DIAGNOSIS — F17210 Nicotine dependence, cigarettes, uncomplicated: Secondary | ICD-10-CM | POA: Diagnosis not present

## 2023-10-29 DIAGNOSIS — Z604 Social exclusion and rejection: Secondary | ICD-10-CM | POA: Diagnosis not present

## 2023-11-01 DIAGNOSIS — R2243 Localized swelling, mass and lump, lower limb, bilateral: Secondary | ICD-10-CM | POA: Diagnosis not present

## 2023-11-01 DIAGNOSIS — G35 Multiple sclerosis: Secondary | ICD-10-CM | POA: Diagnosis not present

## 2023-11-01 DIAGNOSIS — Z993 Dependence on wheelchair: Secondary | ICD-10-CM | POA: Diagnosis not present

## 2023-11-01 DIAGNOSIS — Z452 Encounter for adjustment and management of vascular access device: Secondary | ICD-10-CM | POA: Diagnosis not present

## 2023-11-01 DIAGNOSIS — Z604 Social exclusion and rejection: Secondary | ICD-10-CM | POA: Diagnosis not present

## 2023-11-01 DIAGNOSIS — F17210 Nicotine dependence, cigarettes, uncomplicated: Secondary | ICD-10-CM | POA: Diagnosis not present

## 2023-11-01 DIAGNOSIS — Z89022 Acquired absence of left finger(s): Secondary | ICD-10-CM | POA: Diagnosis not present

## 2023-11-03 ENCOUNTER — Telehealth: Payer: Self-pay | Admitting: Physician Assistant

## 2023-11-03 ENCOUNTER — Ambulatory Visit: Payer: 59

## 2023-11-03 VITALS — Wt 117.0 lb

## 2023-11-03 DIAGNOSIS — Z993 Dependence on wheelchair: Secondary | ICD-10-CM | POA: Diagnosis not present

## 2023-11-03 DIAGNOSIS — R2243 Localized swelling, mass and lump, lower limb, bilateral: Secondary | ICD-10-CM | POA: Diagnosis not present

## 2023-11-03 DIAGNOSIS — Z89022 Acquired absence of left finger(s): Secondary | ICD-10-CM | POA: Diagnosis not present

## 2023-11-03 DIAGNOSIS — Z604 Social exclusion and rejection: Secondary | ICD-10-CM | POA: Diagnosis not present

## 2023-11-03 DIAGNOSIS — Z452 Encounter for adjustment and management of vascular access device: Secondary | ICD-10-CM | POA: Diagnosis not present

## 2023-11-03 DIAGNOSIS — F17210 Nicotine dependence, cigarettes, uncomplicated: Secondary | ICD-10-CM | POA: Diagnosis not present

## 2023-11-03 DIAGNOSIS — Z0000 Encounter for general adult medical examination without abnormal findings: Secondary | ICD-10-CM | POA: Diagnosis not present

## 2023-11-03 DIAGNOSIS — G35 Multiple sclerosis: Secondary | ICD-10-CM | POA: Diagnosis not present

## 2023-11-03 NOTE — Telephone Encounter (Signed)
Home Health Verbal Orders  Agency:  [facility]   Caller: Melissa  Contact and title  Ph# [phone] - OT  Requesting OT and add on Skilled Nursing Evaluation:    Reason for Request:  Patient is requesting Condom Catheter  Frequency:  1 x per week for 8 weeks for OT, strengthening  Requests Order be placed for Drop arm bedside commode and shower transfer bench  HH needs F2F w/in last 30 days

## 2023-11-03 NOTE — Patient Instructions (Signed)
Nathan Marsh , Thank you for taking time to come for your Medicare Wellness Visit. I appreciate your ongoing commitment to your health goals. Please review the following plan we discussed and let me know if I can assist you in the future.   Referrals/Orders/Follow-Ups/Clinician Recommendations: If you wish to quit smoking, help is available. For free tobacco cessation program offerings call the [facility] at 520 070 3459 or Live Well Line at 9192732887. You may also visit www.[email or web address] or email livelifewell@[employer] .com for more information on other programs.   You may also call 1-800-QUIT-NOW (9598354699) or visit www.[employer].ch or www.[employer].org for additional resources on smoking cessation.   Each day, aim for 6 glasses of water, plenty of protein in your diet and try to get up and walk/ stretch every hour for 5-10 minutes at a time.    This is a list of the screening recommended for you and due dates:  Health Maintenance  Topic Date Due   Colon Cancer Screening  Never done   Flu Shot  07/29/2023   Medicare Annual Wellness Visit  11/02/2024   DTaP/Tdap/Td vaccine (4 - Td or Tdap) 09/11/2027   Hepatitis C Screening  Completed   HIV Screening  Completed   HPV Vaccine  Aged Out   COVID-19 Vaccine  Discontinued    Advanced directives: (Declined) Advance directive discussed with you today. Even though you declined this today, please call our office should you change your mind, and we can give you the proper paperwork for you to fill out.  Next Medicare Annual Wellness Visit scheduled for next year: Yes

## 2023-11-03 NOTE — Telephone Encounter (Signed)
Left detailed message on Nathan Marsh's voicemail with [facility], verbal order given for OT and add on Skilled Nursing Evaluation:  Frequency:  1 x per week for 8 weeks for OT, strengthening okay per [facility]. Okay to order be placed for Drop arm bedside commode and shower transfer bench. Any questions please call office.

## 2023-11-03 NOTE — Progress Notes (Signed)
Subjective:   Nathan Marsh is a 47 y.o. male who presents for Medicare Annual/Subsequent preventive examination.  Visit Complete: Virtual I connected with  Nathan Marsh on 11/03/23 by a audio enabled telemedicine application and verified that I am speaking with the correct person using two identifiers.  Patient Location: Home  Provider Location: Home Office  I discussed the limitations of evaluation and management by telemedicine. The patient expressed understanding and agreed to proceed.  Vital Signs: Because this visit was a virtual/telehealth visit, some criteria may be missing or patient reported. Any vitals not documented were not able to be obtained and vitals that have been documented are patient reported.    Cardiac Risk Factors include: male gender;smoking/ tobacco exposure     Objective:    Today's Vitals   11/03/23 1136  Weight: 117 lb (53.1 kg)   Body mass index is 18.88 kg/m.     11/03/2023   11:43 AM 11/07/2022    7:31 PM 07/23/2022   11:29 AM 01/03/2019   11:17 AM 12/20/2018    7:50 AM 09/17/2017   10:59 AM 05/02/2017    2:38 PM  Advanced Directives  Does Patient Have a Medical Advance Directive? No No No No No No No  Would patient like information on creating a medical advance directive? No - Patient declined  No - Patient declined No - Patient declined No - Patient declined  No - Patient declined    Current Medications (verified) Outpatient Encounter Medications as of 11/03/2023  Medication Sig   Misc. Devices (TRANSFER BOARD) MISC Use as needed for transfer from wheelchair   oxybutynin (DITROPAN) 5 MG tablet Take 1 tablet (5 mg total) by mouth 2 (two) times daily.   baclofen (LIORESAL) 10 MG tablet Take 1 tablet (10 mg total) by mouth 3 (three) times daily. (Patient not taking: Reported on 11/03/2023)   furosemide (LASIX) 20 MG tablet Take 1 tablet (20 mg total) by mouth daily as needed for edema. (Patient not taking: Reported on 11/03/2023)    natalizumab (TYSABRI) 300 MG/15ML injection Inject into the vein. (Patient not taking: Reported on 11/03/2023)   No facility-administered encounter medications on file as of 11/03/2023.    Allergies (verified) Patient has no known allergies.   History: Past Medical History:  Diagnosis Date   Multiple sclerosis (HCC)    Past Surgical History:  Procedure Laterality Date   AMPUTATION Left 04/27/2017   Procedure: AMPUTATION DIGIT REVISION LEFT MIDDLE FINGER;  Surgeon: Knute Neu, MD;  Location: [facility] OR;  Service: Orthopedics;  Laterality: Left;   Family History  Problem Relation Age of Onset   Cancer Mother        lung   Social History   Socioeconomic History   Marital status: Single    Spouse name: Not on file   Number of children: Not on file   Years of education: Not on file   Highest education level: Not on file  Occupational History   Not on file  Tobacco Use   Smoking status: Every Day    Current packs/day: 0.50    Average packs/day: 0.5 packs/day for 23.4 years (11.7 ttl pk-yrs)    Types: Cigarettes    Start date: 05/31/2000   Smokeless tobacco: Never  Vaping Use   Vaping status: Never Used  Substance and Sexual Activity   Alcohol use: Yes    Comment: very limited   Drug use: Yes    Types: Marijuana   Sexual activity: Not Currently  Other Topics Concern   Not on file  Social History Narrative   Live alone   Single   No children   Has disability       Social Determinants of Health   Financial Resource Strain: Low Risk  (11/03/2023)   Overall Financial Resource Strain (CARDIA)    Difficulty of Paying Living Expenses: Not hard at all  Food Insecurity: No Food Insecurity (11/03/2023)   Hunger Vital Sign    Worried About Running Out of Food in the Last Year: Never true    Ran Out of Food in the Last Year: Never true  Transportation Needs: No Transportation Needs (11/03/2023)   PRAPARE - Administrator, Civil Service (Medical): No    Lack of  Transportation (Non-Medical): No  Physical Activity: Insufficiently Active (11/03/2023)   Exercise Vital Sign    Days of Exercise per Week: 7 days    Minutes of Exercise per Session: 10 min  Stress: No Stress Concern Present (11/03/2023)   [employer] of Occupational Health - Occupational Stress Questionnaire    Feeling of Stress : Not at all  Social Connections: Socially Isolated (11/03/2023)   Social Connection and Isolation Panel [NHANES]    Frequency of Communication with Friends and Family: Once a week    Frequency of Social Gatherings with Friends and Family: More than three times a week    Attends Religious Services: Never    Database administrator or Organizations: No    Attends Engineer, structural: Never    Marital Status: Never married    Tobacco Counseling Ready to quit: Not Answered Counseling given: Not Answered   Clinical Intake:  Pre-visit preparation completed: Yes  Pain : No/denies pain     BMI - recorded: 18.88 Nutritional Status: BMI <19  Underweight Diabetes: No  How often do you need to have someone help you when you read instructions, pamphlets, or other written materials from your doctor or pharmacy?: 1 - Never  Interpreter Needed?: No  Information entered by :: Lanier Ensign, LPN   Activities of Daily Living    11/03/2023   11:39 AM  In your present state of health, do you have any difficulty performing the following activities:  Hearing? 0  Vision? 0  Difficulty concentrating or making decisions? 0  Walking or climbing stairs? 1  Comment wheel chair  Dressing or bathing? 1  Doing errands, shopping? 0  Preparing Food and eating ? Y  Comment assistance  Using the Toilet? Y  Comment assistance  In the past six months, have you accidently leaked urine? Y  Comment incontinient weras a brief  Do you have problems with loss of bowel control? Y  Comment wears a pad  Managing your Medications? N  Managing your Finances? N   Housekeeping or managing your Housekeeping? N    Patient Care Team: Jarold Motto, [name] as PCP - General (Physician Assistant) Avanell Shackleton, NP-C (Family Medicine)  Indicate any recent Medical Services you may have received from other than [facility] providers in the past year (date may be approximate).     Assessment:   This is a routine wellness examination for [place].  Hearing/Vision screen Hearing Screening - Comments:: Pt denies any hearing issues  Vision Screening - Comments:: Pt encouraged to follow up with provider    Goals Addressed             This Visit's Progress    Patient Stated  Continue to exercise legs        Depression Screen    11/03/2023   11:43 AM 03/10/2023    9:03 AM 02/10/2023    9:39 AM 11/11/2022    9:52 AM 07/23/2022   11:17 AM 07/22/2022    9:35 AM 12/25/2020   10:04 AM  PHQ 2/9 Scores  PHQ - 2 Score 0 0 0 0 1 2 1   PHQ- 9 Score  0 0 2 8 11 10     Fall Risk    11/03/2023   11:45 AM 07/23/2022   11:32 AM  Fall Risk   Falls in the past year? 0 1  Number falls in past yr: 0 1  Injury with Fall? 0 0  Risk for fall due to : No Fall Risks;Impaired mobility History of fall(s);Other (Comment)  Risk for fall due to: Comment  slip off chair at times but able to pick self back up from wheel chair  Follow up Falls prevention discussed     MEDICARE RISK AT HOME: Medicare Risk at Home Any stairs in or around the home?: No If so, are there any without handrails?: No Home free of loose throw rugs in walkways, pet beds, electrical cords, etc?: Yes Adequate lighting in your home to reduce risk of falls?: Yes Life alert?: No Use of a cane, walker or w/c?: No Grab bars in the bathroom?: Yes Shower chair or bench in shower?: No Elevated toilet seat or a handicapped toilet?: No  TIMED UP AND GO:  Was the test performed?  No    Cognitive Function:        11/03/2023   11:45 AM 07/23/2022   11:45 AM  6CIT Screen  What Year? 0 points 0  points  What month? 0 points 0 points  What time? 0 points 0 points  Count back from 20 0 points 0 points  Months in reverse 0 points 4 points  Repeat phrase 0 points 0 points  Total Score 0 points 4 points    Immunizations Immunization History  Administered Date(s) Administered   Influenza,inj,Quad PF,6+ Mos 10/09/2022   Influenza-Unspecified 10/20/2010   Td 12/28/2006   Tdap 04/27/2017, 09/10/2017    TDAP status: Up to date  Flu Vaccine status: Due, Education has been provided regarding the importance of this vaccine. Advised may receive this vaccine at local pharmacy or Health Dept. Aware to provide a copy of the vaccination record if obtained from local pharmacy or Health Dept. Verbalized acceptance and understanding.    Covid-19 vaccine status: Information provided on how to obtain vaccines.   Qualifies for Shingles Vaccine? No    Screening Tests Health Maintenance  Topic Date Due   Colonoscopy  Never done   INFLUENZA VACCINE  03/27/2024 (Originally 07/29/2023)   Medicare Annual Wellness (AWV)  11/02/2024   DTaP/Tdap/Td (4 - Td or Tdap) 09/11/2027   Hepatitis C Screening  Completed   HIV Screening  Completed   HPV VACCINES  Aged Out   COVID-19 Vaccine  Discontinued    Health Maintenance  Health Maintenance Due  Topic Date Due   Colonoscopy  Never done   Additional Screening:  Hepatitis C Screening:  Completed 07/22/22  Vision Screening: Recommended annual ophthalmology exams for early detection of glaucoma and other disorders of the eye. Is the patient up to date with their annual eye exam?  No  Who is the provider or what is the name of the office in which the patient attends annual eye exams? Encouraged  to follow up If pt is not established with a provider, would they like to be referred to a provider to establish care? No .   Dental Screening: Recommended annual dental exams for proper oral hygiene  Community Resource Referral / Chronic Care  Management: CRR required this visit?  No   CCM required this visit?  No     Plan:     I have personally reviewed and noted the following in the patient's chart:   Medical and social history Use of alcohol, tobacco or illicit drugs  Current medications and supplements including opioid prescriptions. Patient is not currently taking opioid prescriptions. Functional ability and status Nutritional status Physical activity Advanced directives List of other physicians Hospitalizations, surgeries, and ER visits in previous 12 months Vitals Screenings to include cognitive, depression, and falls Referrals and appointments  In addition, I have reviewed and discussed with patient certain preventive protocols, quality metrics, and best practice recommendations. A written personalized care plan for preventive services as well as general preventive health recommendations were provided to patient.     Marzella Schlein, LPN   27/01/5365   After Visit Summary: (MyChart) Due to this being a telephonic visit, the after visit summary with patients personalized plan was offered to patient via MyChart   Nurse Notes: none

## 2023-11-08 ENCOUNTER — Telehealth: Payer: Self-pay | Admitting: Physician Assistant

## 2023-11-08 NOTE — Telephone Encounter (Signed)
Document Home Health Certificate (Order ID [id number]), to be filled out by provider. Patient requested to send it back via Fax within 5-days. Document is located in providers tray at front office.Please advise

## 2023-11-09 DIAGNOSIS — R2243 Localized swelling, mass and lump, lower limb, bilateral: Secondary | ICD-10-CM | POA: Diagnosis not present

## 2023-11-09 DIAGNOSIS — R634 Abnormal weight loss: Secondary | ICD-10-CM | POA: Diagnosis not present

## 2023-11-09 DIAGNOSIS — F17210 Nicotine dependence, cigarettes, uncomplicated: Secondary | ICD-10-CM | POA: Diagnosis not present

## 2023-11-09 DIAGNOSIS — Z89022 Acquired absence of left finger(s): Secondary | ICD-10-CM | POA: Diagnosis not present

## 2023-11-09 DIAGNOSIS — Z9181 History of falling: Secondary | ICD-10-CM | POA: Diagnosis not present

## 2023-11-09 DIAGNOSIS — Z604 Social exclusion and rejection: Secondary | ICD-10-CM | POA: Diagnosis not present

## 2023-11-09 DIAGNOSIS — G35 Multiple sclerosis: Secondary | ICD-10-CM | POA: Diagnosis not present

## 2023-11-09 DIAGNOSIS — Z993 Dependence on wheelchair: Secondary | ICD-10-CM | POA: Diagnosis not present

## 2023-11-09 NOTE — Telephone Encounter (Signed)
Documents signed and faxed back to [facility].

## 2023-11-15 DIAGNOSIS — Z604 Social exclusion and rejection: Secondary | ICD-10-CM | POA: Diagnosis not present

## 2023-11-15 DIAGNOSIS — Z89022 Acquired absence of left finger(s): Secondary | ICD-10-CM | POA: Diagnosis not present

## 2023-11-15 DIAGNOSIS — F17210 Nicotine dependence, cigarettes, uncomplicated: Secondary | ICD-10-CM | POA: Diagnosis not present

## 2023-11-15 DIAGNOSIS — Z993 Dependence on wheelchair: Secondary | ICD-10-CM | POA: Diagnosis not present

## 2023-11-15 DIAGNOSIS — G35 Multiple sclerosis: Secondary | ICD-10-CM | POA: Diagnosis not present

## 2023-11-15 DIAGNOSIS — Z9181 History of falling: Secondary | ICD-10-CM | POA: Diagnosis not present

## 2023-11-16 DIAGNOSIS — F17210 Nicotine dependence, cigarettes, uncomplicated: Secondary | ICD-10-CM | POA: Diagnosis not present

## 2023-11-16 DIAGNOSIS — Z993 Dependence on wheelchair: Secondary | ICD-10-CM | POA: Diagnosis not present

## 2023-11-16 DIAGNOSIS — Z9181 History of falling: Secondary | ICD-10-CM | POA: Diagnosis not present

## 2023-11-16 DIAGNOSIS — Z604 Social exclusion and rejection: Secondary | ICD-10-CM | POA: Diagnosis not present

## 2023-11-16 DIAGNOSIS — Z89022 Acquired absence of left finger(s): Secondary | ICD-10-CM | POA: Diagnosis not present

## 2023-11-16 DIAGNOSIS — G35 Multiple sclerosis: Secondary | ICD-10-CM | POA: Diagnosis not present

## 2023-11-19 DIAGNOSIS — G35 Multiple sclerosis: Secondary | ICD-10-CM | POA: Diagnosis not present

## 2023-11-22 DIAGNOSIS — G35 Multiple sclerosis: Secondary | ICD-10-CM | POA: Diagnosis not present

## 2023-11-22 DIAGNOSIS — Z89022 Acquired absence of left finger(s): Secondary | ICD-10-CM | POA: Diagnosis not present

## 2023-11-22 DIAGNOSIS — Z9181 History of falling: Secondary | ICD-10-CM | POA: Diagnosis not present

## 2023-11-22 DIAGNOSIS — Z993 Dependence on wheelchair: Secondary | ICD-10-CM | POA: Diagnosis not present

## 2023-11-22 DIAGNOSIS — Z604 Social exclusion and rejection: Secondary | ICD-10-CM | POA: Diagnosis not present

## 2023-11-22 DIAGNOSIS — F17210 Nicotine dependence, cigarettes, uncomplicated: Secondary | ICD-10-CM | POA: Diagnosis not present

## 2023-11-29 DIAGNOSIS — Z604 Social exclusion and rejection: Secondary | ICD-10-CM | POA: Diagnosis not present

## 2023-11-29 DIAGNOSIS — Z89022 Acquired absence of left finger(s): Secondary | ICD-10-CM | POA: Diagnosis not present

## 2023-11-29 DIAGNOSIS — Z9181 History of falling: Secondary | ICD-10-CM | POA: Diagnosis not present

## 2023-11-29 DIAGNOSIS — F17210 Nicotine dependence, cigarettes, uncomplicated: Secondary | ICD-10-CM | POA: Diagnosis not present

## 2023-11-29 DIAGNOSIS — G35 Multiple sclerosis: Secondary | ICD-10-CM | POA: Diagnosis not present

## 2023-11-29 DIAGNOSIS — Z993 Dependence on wheelchair: Secondary | ICD-10-CM | POA: Diagnosis not present

## 2023-12-01 ENCOUNTER — Telehealth: Payer: Self-pay | Admitting: Physician Assistant

## 2023-12-01 DIAGNOSIS — Z993 Dependence on wheelchair: Secondary | ICD-10-CM | POA: Diagnosis not present

## 2023-12-01 DIAGNOSIS — Z604 Social exclusion and rejection: Secondary | ICD-10-CM | POA: Diagnosis not present

## 2023-12-01 DIAGNOSIS — Z9181 History of falling: Secondary | ICD-10-CM | POA: Diagnosis not present

## 2023-12-01 DIAGNOSIS — G35 Multiple sclerosis: Secondary | ICD-10-CM | POA: Diagnosis not present

## 2023-12-01 DIAGNOSIS — F17210 Nicotine dependence, cigarettes, uncomplicated: Secondary | ICD-10-CM | POA: Diagnosis not present

## 2023-12-01 DIAGNOSIS — Z89022 Acquired absence of left finger(s): Secondary | ICD-10-CM | POA: Diagnosis not present

## 2023-12-01 NOTE — Telephone Encounter (Signed)
Home Health Verbal Orders  Agency:  [facility]   Caller: Tawny Hopping, [place]  [phone]  Requesting OT/ PT/ Skilled nursing/ Social Work/ Speech:  OT extension and Social worker evaluation  Reason for Request:  Improve eating skills with assistance tools and assistance to get patient into handicap placement   Frequency:  (Child psychotherapist)-- 1 x 1 week  (OT)- 1 x 5 weeks   HH needs F2F w/in last 30 days

## 2023-12-01 NOTE — Telephone Encounter (Signed)
Left patient a detailed voice message to return call to office.   

## 2023-12-07 DIAGNOSIS — F17210 Nicotine dependence, cigarettes, uncomplicated: Secondary | ICD-10-CM | POA: Diagnosis not present

## 2023-12-07 DIAGNOSIS — Z604 Social exclusion and rejection: Secondary | ICD-10-CM | POA: Diagnosis not present

## 2023-12-07 DIAGNOSIS — G35 Multiple sclerosis: Secondary | ICD-10-CM | POA: Diagnosis not present

## 2023-12-07 DIAGNOSIS — Z993 Dependence on wheelchair: Secondary | ICD-10-CM | POA: Diagnosis not present

## 2023-12-07 DIAGNOSIS — Z89022 Acquired absence of left finger(s): Secondary | ICD-10-CM | POA: Diagnosis not present

## 2023-12-07 DIAGNOSIS — Z9181 History of falling: Secondary | ICD-10-CM | POA: Diagnosis not present

## 2023-12-07 NOTE — Progress Notes (Signed)
Nathan Marsh is a 47 y.o. male here for a new problem. History of Present Illness:   Health Maintenance Due  Topic Date Due   Colonoscopy  Never done   No chief complaint on file.  HPI Malodorous Urine: {ELStarters:31163}         ***  ***  ***  ***  *** Past Medical History:  Diagnosis Date   Multiple sclerosis (HCC)     Social History   Tobacco Use   Smoking status: Every Day    Current packs/day: 0.50    Average packs/day: 0.5 packs/day for 23.5 years (11.8 ttl pk-yrs)    Types: Cigarettes    Start date: 05/31/2000   Smokeless tobacco: Never  Vaping Use   Vaping status: Never Used  Substance Use Topics   Alcohol use: Yes    Comment: very limited   Drug use: Yes    Types: Marijuana   Past Surgical History:  Procedure Laterality Date   AMPUTATION Left 04/27/2017   Procedure: AMPUTATION DIGIT REVISION LEFT MIDDLE FINGER;  Surgeon: Knute Neu, MD;  Location: [facility] OR;  Service: Orthopedics;  Laterality: Left;   Family History  Problem Relation Age of Onset   Cancer Mother        lung   No Known Allergies Current Medications:   Current Outpatient Medications:    baclofen (LIORESAL) 10 MG tablet, Take 1 tablet (10 mg total) by mouth 3 (three) times daily. (Patient not taking: Reported on 11/03/2023), Disp: 30 each, Rfl: 5   furosemide (LASIX) 20 MG tablet, Take 1 tablet (20 mg total) by mouth daily as needed for edema. (Patient not taking: Reported on 11/03/2023), Disp: 30 tablet, Rfl: 0   Misc. Devices (TRANSFER BOARD) MISC, Use as needed for transfer from wheelchair, Disp: 1 each, Rfl: 0   natalizumab (TYSABRI) 300 MG/15ML injection, Inject into the vein. (Patient not taking: Reported on 11/03/2023), Disp: , Rfl:    oxybutynin (DITROPAN) 5 MG tablet, Take 1 tablet (5 mg total) by mouth 2 (two) times daily., Disp: 60 tablet, Rfl: 5  Review of Systems:   ROS See pertinent positives and negatives as per the HPI.  Vitals:   There were no vitals filed  for this visit.   There is no height or weight on file to calculate BMI.  Physical Exam:   Physical Exam  Assessment and Plan:   There are no diagnoses linked to this encounter.            I,Emily Lagle,acting as a Neurosurgeon for [employer], PA.,have documented all relevant documentation on the behalf of Jarold Motto, PA,as directed by  Jarold Motto, PA while in the presence of Jarold Motto, [name].  *** (refresh reminder)  I, Jarold Motto, PA, have reviewed all documentation for this visit. The documentation on 12/07/23 for the exam, diagnosis, procedures, and orders are all accurate and complete.  Jarold Motto, PA-C

## 2023-12-09 ENCOUNTER — Ambulatory Visit: Payer: 59 | Admitting: Physician Assistant

## 2023-12-09 ENCOUNTER — Encounter: Payer: Self-pay | Admitting: Physician Assistant

## 2023-12-09 VITALS — BP 110/70 | HR 64 | Temp 97.8°F | Ht 66.0 in

## 2023-12-09 DIAGNOSIS — R8290 Unspecified abnormal findings in urine: Secondary | ICD-10-CM | POA: Diagnosis not present

## 2023-12-09 DIAGNOSIS — G35 Multiple sclerosis: Secondary | ICD-10-CM | POA: Diagnosis not present

## 2023-12-09 NOTE — Telephone Encounter (Signed)
Left detailed message on Nathan Marsh's personal voicemail, calling to give verbal orders for pt, you called on 12/4 and we had not seen the patient in the past 30 days. Pt came in today to see the provider. Verbal orders given for:  Requesting OT/ PT/ Skilled nursing/ Social Work/ Speech:  OT extension and Presenter, broadcasting.   Reason for Request:  Improve eating skills with assistance tools and assistance to get patient into handicap placement    Frequency:  (Child psychotherapist)-- 1 x 1 week  (OT)- 1 x 5 weeks   Okay per provider, if you need office notes from today or anything else please let me know.

## 2023-12-09 NOTE — Patient Instructions (Signed)
It was great to see you!  Please collect urine sample: Clean the head of the penis with a sterile wipe. If you are not circumcised, you will need to pull back (retract) the foreskin first. Urinate a small amount into the toilet bowl, and then stop the flow of urine. Then collect a sample of urine into the clean or sterile cup, until it is half full.  If unable to return it right away, please keep in fridge and return it when you can  Take care,  Jarold Motto PA-C

## 2023-12-10 ENCOUNTER — Telehealth: Payer: Self-pay | Admitting: Physician Assistant

## 2023-12-10 DIAGNOSIS — Z993 Dependence on wheelchair: Secondary | ICD-10-CM | POA: Diagnosis not present

## 2023-12-10 DIAGNOSIS — Z89022 Acquired absence of left finger(s): Secondary | ICD-10-CM | POA: Diagnosis not present

## 2023-12-10 DIAGNOSIS — Z604 Social exclusion and rejection: Secondary | ICD-10-CM | POA: Diagnosis not present

## 2023-12-10 DIAGNOSIS — G35 Multiple sclerosis: Secondary | ICD-10-CM | POA: Diagnosis not present

## 2023-12-10 DIAGNOSIS — Z9181 History of falling: Secondary | ICD-10-CM | POA: Diagnosis not present

## 2023-12-10 DIAGNOSIS — F17210 Nicotine dependence, cigarettes, uncomplicated: Secondary | ICD-10-CM | POA: Diagnosis not present

## 2023-12-10 NOTE — Telephone Encounter (Signed)
Please advise if verbal order is ok

## 2023-12-10 NOTE — Telephone Encounter (Signed)
Home Health Verbal Orders  Agency:  [facility]  Caller: Enrique Sack -PT  Contact and title Ph# [phone] - PT  Requesting Skilled nursing:    Reason for Request:  Patient was unable to urinate for a specimen, urine is foul smelling, Patient has difficulty urinating and collecting a specimen is very difficult  Frequency:  Requests Nursing go out and do UA with CS via in and out Cath  [facility] needs F2F w/in last 30 days

## 2023-12-13 NOTE — Telephone Encounter (Signed)
Returned Snyder call and gave verbal ok for orders needed. Advised cb number if needing to return call.

## 2023-12-15 DIAGNOSIS — Z89022 Acquired absence of left finger(s): Secondary | ICD-10-CM | POA: Diagnosis not present

## 2023-12-15 DIAGNOSIS — Z9181 History of falling: Secondary | ICD-10-CM | POA: Diagnosis not present

## 2023-12-15 DIAGNOSIS — F17210 Nicotine dependence, cigarettes, uncomplicated: Secondary | ICD-10-CM | POA: Diagnosis not present

## 2023-12-15 DIAGNOSIS — Z604 Social exclusion and rejection: Secondary | ICD-10-CM | POA: Diagnosis not present

## 2023-12-15 DIAGNOSIS — Z993 Dependence on wheelchair: Secondary | ICD-10-CM | POA: Diagnosis not present

## 2023-12-15 DIAGNOSIS — G35 Multiple sclerosis: Secondary | ICD-10-CM | POA: Diagnosis not present

## 2023-12-16 DIAGNOSIS — Z9181 History of falling: Secondary | ICD-10-CM | POA: Diagnosis not present

## 2023-12-16 DIAGNOSIS — Z993 Dependence on wheelchair: Secondary | ICD-10-CM | POA: Diagnosis not present

## 2023-12-16 DIAGNOSIS — G35 Multiple sclerosis: Secondary | ICD-10-CM | POA: Diagnosis not present

## 2023-12-16 DIAGNOSIS — Z604 Social exclusion and rejection: Secondary | ICD-10-CM | POA: Diagnosis not present

## 2023-12-16 DIAGNOSIS — F17210 Nicotine dependence, cigarettes, uncomplicated: Secondary | ICD-10-CM | POA: Diagnosis not present

## 2023-12-16 DIAGNOSIS — Z89022 Acquired absence of left finger(s): Secondary | ICD-10-CM | POA: Diagnosis not present

## 2023-12-17 DIAGNOSIS — Z9181 History of falling: Secondary | ICD-10-CM | POA: Diagnosis not present

## 2023-12-17 DIAGNOSIS — Z604 Social exclusion and rejection: Secondary | ICD-10-CM | POA: Diagnosis not present

## 2023-12-17 DIAGNOSIS — F17210 Nicotine dependence, cigarettes, uncomplicated: Secondary | ICD-10-CM | POA: Diagnosis not present

## 2023-12-17 DIAGNOSIS — G35 Multiple sclerosis: Secondary | ICD-10-CM | POA: Diagnosis not present

## 2023-12-17 DIAGNOSIS — Z89022 Acquired absence of left finger(s): Secondary | ICD-10-CM | POA: Diagnosis not present

## 2023-12-17 DIAGNOSIS — Z993 Dependence on wheelchair: Secondary | ICD-10-CM | POA: Diagnosis not present

## 2023-12-20 DIAGNOSIS — F17210 Nicotine dependence, cigarettes, uncomplicated: Secondary | ICD-10-CM | POA: Diagnosis not present

## 2023-12-20 DIAGNOSIS — Z9181 History of falling: Secondary | ICD-10-CM | POA: Diagnosis not present

## 2023-12-20 DIAGNOSIS — Z89022 Acquired absence of left finger(s): Secondary | ICD-10-CM | POA: Diagnosis not present

## 2023-12-20 DIAGNOSIS — Z604 Social exclusion and rejection: Secondary | ICD-10-CM | POA: Diagnosis not present

## 2023-12-20 DIAGNOSIS — Z993 Dependence on wheelchair: Secondary | ICD-10-CM | POA: Diagnosis not present

## 2023-12-20 DIAGNOSIS — G35 Multiple sclerosis: Secondary | ICD-10-CM | POA: Diagnosis not present

## 2023-12-21 ENCOUNTER — Other Ambulatory Visit: Payer: Self-pay | Admitting: Physician Assistant

## 2023-12-21 MED ORDER — CEPHALEXIN 500 MG PO CAPS: 500.0000 mg | ORAL_CAPSULE | Freq: Two times a day (BID) | ORAL | 0 refills | Status: DC

## 2023-12-27 DIAGNOSIS — F17210 Nicotine dependence, cigarettes, uncomplicated: Secondary | ICD-10-CM | POA: Diagnosis not present

## 2023-12-27 DIAGNOSIS — Z993 Dependence on wheelchair: Secondary | ICD-10-CM | POA: Diagnosis not present

## 2023-12-27 DIAGNOSIS — Z604 Social exclusion and rejection: Secondary | ICD-10-CM | POA: Diagnosis not present

## 2023-12-27 DIAGNOSIS — G35 Multiple sclerosis: Secondary | ICD-10-CM | POA: Diagnosis not present

## 2023-12-27 DIAGNOSIS — Z9181 History of falling: Secondary | ICD-10-CM | POA: Diagnosis not present

## 2023-12-27 DIAGNOSIS — Z89022 Acquired absence of left finger(s): Secondary | ICD-10-CM | POA: Diagnosis not present

## 2023-12-31 ENCOUNTER — Telehealth: Payer: Self-pay | Admitting: Physician Assistant

## 2023-12-31 DIAGNOSIS — Z9181 History of falling: Secondary | ICD-10-CM | POA: Diagnosis not present

## 2023-12-31 DIAGNOSIS — Z604 Social exclusion and rejection: Secondary | ICD-10-CM | POA: Diagnosis not present

## 2023-12-31 DIAGNOSIS — F17210 Nicotine dependence, cigarettes, uncomplicated: Secondary | ICD-10-CM | POA: Diagnosis not present

## 2023-12-31 DIAGNOSIS — G35 Multiple sclerosis: Secondary | ICD-10-CM | POA: Diagnosis not present

## 2023-12-31 DIAGNOSIS — Z89022 Acquired absence of left finger(s): Secondary | ICD-10-CM | POA: Diagnosis not present

## 2023-12-31 DIAGNOSIS — Z993 Dependence on wheelchair: Secondary | ICD-10-CM | POA: Diagnosis not present

## 2023-12-31 NOTE — Telephone Encounter (Signed)
 Copied from CRM [id number]. Topic: Clinical - Lab/Test Results >> Dec 31, 2023  1:31 PM Joanell B wrote: Reason for CRM: Katelyn from [facility] is calling to confirm the order was received regarding a urine sample for the pt, and would like to request a callback for a follow up. Order # [id number]

## 2023-12-31 NOTE — Telephone Encounter (Signed)
 Order signed and faxed to [facility] at [phone].

## 2024-01-03 ENCOUNTER — Telehealth: Payer: Self-pay | Admitting: Physician Assistant

## 2024-01-03 NOTE — Telephone Encounter (Signed)
 Home Health Certificate (Order ID [id number]), to be filled out by provider. Patient requested to send it back via Fax within 5-days. Document is located in providers tray at front office.Please advise at  [phone]

## 2024-01-04 DIAGNOSIS — G35 Multiple sclerosis: Secondary | ICD-10-CM | POA: Diagnosis not present

## 2024-01-04 DIAGNOSIS — Z993 Dependence on wheelchair: Secondary | ICD-10-CM | POA: Diagnosis not present

## 2024-01-04 DIAGNOSIS — R531 Weakness: Secondary | ICD-10-CM | POA: Diagnosis not present

## 2024-01-04 DIAGNOSIS — N390 Urinary tract infection, site not specified: Secondary | ICD-10-CM | POA: Diagnosis not present

## 2024-01-04 DIAGNOSIS — F17210 Nicotine dependence, cigarettes, uncomplicated: Secondary | ICD-10-CM | POA: Diagnosis not present

## 2024-01-04 DIAGNOSIS — Z89022 Acquired absence of left finger(s): Secondary | ICD-10-CM | POA: Diagnosis not present

## 2024-01-04 DIAGNOSIS — Z9181 History of falling: Secondary | ICD-10-CM | POA: Diagnosis not present

## 2024-01-04 DIAGNOSIS — Z604 Social exclusion and rejection: Secondary | ICD-10-CM | POA: Diagnosis not present

## 2024-01-05 NOTE — Telephone Encounter (Signed)
 Documents signed and faxed to [facility].

## 2024-01-06 DIAGNOSIS — G35 Multiple sclerosis: Secondary | ICD-10-CM | POA: Diagnosis not present

## 2024-01-11 DIAGNOSIS — G35 Multiple sclerosis: Secondary | ICD-10-CM | POA: Diagnosis not present

## 2024-01-11 DIAGNOSIS — N390 Urinary tract infection, site not specified: Secondary | ICD-10-CM | POA: Diagnosis not present

## 2024-01-11 DIAGNOSIS — Z89022 Acquired absence of left finger(s): Secondary | ICD-10-CM | POA: Diagnosis not present

## 2024-01-11 DIAGNOSIS — F17210 Nicotine dependence, cigarettes, uncomplicated: Secondary | ICD-10-CM | POA: Diagnosis not present

## 2024-01-11 DIAGNOSIS — Z993 Dependence on wheelchair: Secondary | ICD-10-CM | POA: Diagnosis not present

## 2024-01-11 DIAGNOSIS — Z9181 History of falling: Secondary | ICD-10-CM | POA: Diagnosis not present

## 2024-01-11 DIAGNOSIS — Z604 Social exclusion and rejection: Secondary | ICD-10-CM | POA: Diagnosis not present

## 2024-01-13 ENCOUNTER — Encounter (HOSPITAL_COMMUNITY): Payer: Self-pay

## 2024-01-13 ENCOUNTER — Emergency Department (HOSPITAL_COMMUNITY)
Admission: EM | Admit: 2024-01-13 | Discharge: 2024-01-14 | Disposition: A | Discharge status: Home / Self Care | Payer: 59 | Attending: Emergency Medicine | Admitting: Emergency Medicine

## 2024-01-13 ENCOUNTER — Emergency Department (HOSPITAL_COMMUNITY): Payer: 59

## 2024-01-13 ENCOUNTER — Other Ambulatory Visit: Payer: Self-pay

## 2024-01-13 DIAGNOSIS — G35 Multiple sclerosis: Secondary | ICD-10-CM | POA: Diagnosis not present

## 2024-01-13 DIAGNOSIS — R471 Dysarthria and anarthria: Secondary | ICD-10-CM | POA: Diagnosis not present

## 2024-01-13 DIAGNOSIS — Z1152 Encounter for screening for COVID-19: Secondary | ICD-10-CM | POA: Insufficient documentation

## 2024-01-13 DIAGNOSIS — R531 Weakness: Secondary | ICD-10-CM | POA: Insufficient documentation

## 2024-01-13 DIAGNOSIS — I10 Essential (primary) hypertension: Secondary | ICD-10-CM | POA: Diagnosis not present

## 2024-01-13 DIAGNOSIS — R059 Cough, unspecified: Secondary | ICD-10-CM | POA: Insufficient documentation

## 2024-01-13 DIAGNOSIS — Z993 Dependence on wheelchair: Secondary | ICD-10-CM | POA: Insufficient documentation

## 2024-01-13 DIAGNOSIS — R600 Localized edema: Secondary | ICD-10-CM | POA: Insufficient documentation

## 2024-01-13 DIAGNOSIS — J40 Bronchitis, not specified as acute or chronic: Secondary | ICD-10-CM | POA: Diagnosis not present

## 2024-01-13 DIAGNOSIS — R5383 Other fatigue: Secondary | ICD-10-CM | POA: Diagnosis not present

## 2024-01-13 LAB — CBC WITH DIFFERENTIAL/PLATELET
Abs Immature Granulocytes: 0.03 10*3/uL (ref 0.00–0.07)
Basophils Absolute: 0.1 10*3/uL (ref 0.0–0.1)
Basophils Relative: 1 %
Eosinophils Absolute: 0.1 10*3/uL (ref 0.0–0.5)
Eosinophils Relative: 2 %
HCT: 37.6 % — ABNORMAL LOW (ref 39.0–52.0)
Hemoglobin: 11.4 g/dL — ABNORMAL LOW (ref 13.0–17.0)
Immature Granulocytes: 0 %
Lymphocytes Relative: 23 %
Lymphs Abs: 1.7 10*3/uL (ref 0.7–4.0)
MCH: 23.6 pg — ABNORMAL LOW (ref 26.0–34.0)
MCHC: 30.3 g/dL (ref 30.0–36.0)
MCV: 77.8 fL — ABNORMAL LOW (ref 80.0–100.0)
Monocytes Absolute: 1.3 10*3/uL — ABNORMAL HIGH (ref 0.1–1.0)
Monocytes Relative: 17 %
Neutro Abs: 4.4 10*3/uL (ref 1.7–7.7)
Neutrophils Relative %: 57 %
Platelets: 110 10*3/uL — ABNORMAL LOW (ref 150–400)
RBC: 4.83 MIL/uL (ref 4.22–5.81)
RDW: 16.8 % — ABNORMAL HIGH (ref 11.5–15.5)
WBC: 7.7 10*3/uL (ref 4.0–10.5)
nRBC: 0.7 % — ABNORMAL HIGH (ref 0.0–0.2)

## 2024-01-13 LAB — COMPREHENSIVE METABOLIC PANEL
ALT: 10 U/L (ref 0–44)
AST: 19 U/L (ref 15–41)
Albumin: 3.8 g/dL (ref 3.5–5.0)
Alkaline Phosphatase: 55 U/L (ref 38–126)
Anion gap: 12 (ref 5–15)
BUN: 14 mg/dL (ref 6–20)
CO2: 24 mmol/L (ref 22–32)
Calcium: 9.1 mg/dL (ref 8.9–10.3)
Chloride: 104 mmol/L (ref 98–111)
Creatinine, Ser: 0.98 mg/dL (ref 0.61–1.24)
GFR, Estimated: >60 mL/min (ref >60)
Glucose, Bld: 95 mg/dL (ref 70–99)
Potassium: 3.8 mmol/L (ref 3.5–5.1)
Sodium: 140 mmol/L (ref 135–145)
Total Bilirubin: 1.1 mg/dL (ref 0.0–1.2)
Total Protein: 6.4 g/dL — ABNORMAL LOW (ref 6.5–8.1)

## 2024-01-13 LAB — RESP PANEL BY RT-PCR (RSV, FLU A&B, COVID)  RVPGX2
Influenza A by PCR: NEGATIVE
Influenza B by PCR: NEGATIVE
Resp Syncytial Virus by PCR: NEGATIVE
SARS Coronavirus 2 by RT PCR: NEGATIVE

## 2024-01-13 LAB — MAGNESIUM: Magnesium: 1.7 mg/dL (ref 1.7–2.4)

## 2024-01-13 MED ORDER — LACTATED RINGERS IV BOLUS
1000.0000 mL | Freq: Once | INTRAVENOUS | Status: AC
  Administered 2024-01-13: 1000 mL via INTRAVENOUS

## 2024-01-13 NOTE — ED Provider Notes (Signed)
[place] EMERGENCY DEPARTMENT AT [facility] Provider Note  MDM   HPI/ROS:  Nathan Marsh is a 48 y.o. male with pertinent past medical history of multiple sclerosis, baseline dysarthria, chronic lower extremity swelling who presents rhinorrhea and generalized weakness.  Patient reports a 1 day history of generalized weakness with associated congestion and rhinorrhea.  He denies any associated fevers, chills, cough, chest pain, dyspnea, abdominal pain, nausea, vomiting, dysuria, diarrhea or constipation.  He does report poor p.o. intake over the past couple of days.  His home health aide reportedly called EMS for transport to the ED.  On further discussion with patient's sister, she reports she spoke with patient's aide this afternoon who states that he appeared generally weak and not feeling well this morning, and when she returned this evening she noted that he had urinated on himself which is not his baseline.  Although he is wheelchair-bound at baseline she states he is normally fully interactive and usually uses a urinal in between home health visits.  She denies any known recent fevers or other infectious symptoms.  She notes that he had a ground-level fall a few weeks ago but had been acting appropriately until today.  He does not take any anticoagulants.  PT BIB [facility] from home after family caregivers noticed slight cough and weakness. PT Aox4, has, some cognitive delays, and disarthria. Endorses weaknes   Physical exam is notable for: - 1+ pitting edema bilateral lower extremities -- Dysarthric speech (baseline) -- mild generalized weakness but able to follow commands in all 4 extremities -- Dry mucous membranes  On my initial evaluation, patient is:  -Vital signs stable. Patient afebrile, hemodynamically stable, and non-toxic appearing. -Additional history obtained from patient's sister as above  Based on this patient's current presentation, including their history and  physical exam, initial differentials include metabolic encephalopathy, infection (most likely viral versus pneumonia given presence of congestion, additionally consider UTI, less likely meningitis).given that patient currently appears in his baseline mental state with nonfocal neurologic exam low suspicion for primary CNS process as cause of generalized weakness and will defer CT at this time.  PCP glucose was normal with EMS so less likely hypoglycemia.  Could additionally consider polypharmacy, other ingestion/intoxication however less likely based on review of home medications and patient does not appear acutely intoxicated.  Plan to obtain basic labs, chest x-ray, viral testing and UA.  Additionally giving IV fluids as patient appears dehydrated.  Interpretations, interventions, and the patient's course of care are documented below.    Labs with no leukocytosis, stable anemia, normal creatinine and electrolytes, negative COVID/flu/RSV.  Chest x-ray without evidence of pneumonia.  On reevaluation, patient reporting that he feels significantly improved after receiving IV fluids.  He continues to have dysarthric speech which is baseline but is more quick to respond.  Based on discussion with patient and appearance back to his baseline, feel patient is stable for discharge home.  Of note he is wheelchair-bound at baseline and per discussion with his sister has previously declined 24-hour home health services or SNF placement thus do not feel he would benefit from prolonged stay in the ED for PT/OT eval.  At his baseline he has a home health aide visit for 3 hours in the morning and evening and per sister he sleeps in his wheelchair at night.  Based on my assessment of patient and shared decision making feel he is safe for discharge at this time.  Suspect his generalized weakness likely due to dehydration.  Low suspicion for UTI given lack of urinary symptoms so UA discontinued.  Patient was handed off to  oncoming provider pending ambulance transport home.  Disposition:  Discharge: I discussed the plan for discharge with the patient and/or their surrogate at bedside prior to discharge and they were in agreement with the plan and verbalized understanding of the return precautions provided. All questions answered to the best of my ability. I am reassured that they are capable of close follow up and good social support at home.   Clinical Impression:  1. Generalized weakness     Rx / DC Orders ED Discharge Orders     None       The plan for this patient was discussed with Dr. Eloise Harman, who voiced agreement and who oversaw evaluation and treatment of this patient.   Clinical Complexity A medically appropriate history, review of systems, and physical exam was performed.  My independent interpretations of EKG, labs, and radiology are documented in the ED course above.   If decision rules were used in this patient's evaluation, they are listed below.   Patient's presentation is most consistent with acute presentation with potential threat to life or bodily function.  Medical Decision Making Amount and/or Complexity of Data Reviewed Labs: ordered. Radiology: ordered.    HPI/ROS      See MDM section for pertinent HPI and ROS. A complete ROS was performed with pertinent positives/negatives noted above.   Past Medical History:  Diagnosis Date   Multiple sclerosis [facility])     Past Surgical History:  Procedure Laterality Date   AMPUTATION Left 04/27/2017   Procedure: AMPUTATION DIGIT REVISION LEFT MIDDLE FINGER;  Surgeon: Knute Neu, MD;  Location: [facility] OR;  Service: Orthopedics;  Laterality: Left;      Physical Exam   Vitals:   01/13/24 1909 01/13/24 1911  BP: 127/85   Pulse: 63   Resp: 17   Temp: 98.7 F (37.1 C)   TempSrc: Oral   SpO2: 100%   Weight:  77.1 kg  Height:  5\' 6"  (1.676 m)    Physical Exam Gen: NAD. Appears comfortable HENT: Conjunctiva clear, PERRL,  EOMI. dry mucous membranes.  CV: RRR. No M/R/G Pulm: Lungs CTAB with no wheezing, rales, or rhonchi.  GI: Abdomen soft, non-tender, non-distended. Normal bowel sounds in all 4 quadrants. MSK/Skin: 1+ pitting edema bilateral lower extremitiesExtremities warm, well-perfused with 2+ pulses in all 4 extremities. Neuro: A&Ox3.  GCS 15.  Dysarthric speech (baseline).  4+/5 strength in all 4 extremities.  Sensation intact to light touch throughout   Procedures   If procedures were preformed on this patient, they are listed below:  Procedures   Mikeal Hawthorne, MD Emergency Medicine PGY-2   Please note that this documentation was produced with the assistance of voice-to-text technology and may contain errors.    Mikeal Hawthorne, MD 01/13/24 2359    Rondel Baton, MD 01/14/24 [phone]

## 2024-01-13 NOTE — ED Triage Notes (Signed)
PT BIB [facility] from home after family caregivers noticed slight cough and weakness. PT Aox4, has, some cognitive delays, and disarthria. Endorses weakness. [facility] vitals:   BP 140/100, HR 68, 97% RA CBG 111

## 2024-01-13 NOTE — Discharge Instructions (Addendum)
You were seen in the emergency department for generalized weakness.  Chest performed while you are here include chest x-ray, viral testing, and blood work which was all reassuring against emergent or life-threatening causes of your symptoms.  We gave you IV fluids with improvement of your symptoms.  Please make sure you are staying well-hydrated when you return home.

## 2024-01-14 ENCOUNTER — Telehealth: Payer: Self-pay | Admitting: *Deleted

## 2024-01-14 DIAGNOSIS — Z7401 Bed confinement status: Secondary | ICD-10-CM | POA: Diagnosis not present

## 2024-01-14 DIAGNOSIS — Z743 Need for continuous supervision: Secondary | ICD-10-CM | POA: Diagnosis not present

## 2024-01-14 DIAGNOSIS — R531 Weakness: Secondary | ICD-10-CM | POA: Diagnosis not present

## 2024-01-14 DIAGNOSIS — R5383 Other fatigue: Secondary | ICD-10-CM | POA: Diagnosis not present

## 2024-01-14 NOTE — ED Notes (Signed)
Spoke with pts daughter and confirms family member can be present to stay with pt when he gets home, daughter request being notified of ptar arrival

## 2024-01-14 NOTE — Telephone Encounter (Signed)
Copied from CRM [id number]. Topic: Clinical - Home Health Verbal Orders >> Jan 14, 2024  8:26 AM Adele Barthel wrote: Caller/Agency: [facility] Callback Number: Judeth Cornfield [phone] Service Requested: Occupational Therapy Frequency: OT was unable to meet with patient for visit today due to issues with scheduling, will visit him next week.  Any new concerns about the patient? No

## 2024-01-14 NOTE — Telephone Encounter (Signed)
Error

## 2024-01-19 DIAGNOSIS — Z89022 Acquired absence of left finger(s): Secondary | ICD-10-CM | POA: Diagnosis not present

## 2024-01-19 DIAGNOSIS — F17210 Nicotine dependence, cigarettes, uncomplicated: Secondary | ICD-10-CM | POA: Diagnosis not present

## 2024-01-19 DIAGNOSIS — Z993 Dependence on wheelchair: Secondary | ICD-10-CM | POA: Diagnosis not present

## 2024-01-19 DIAGNOSIS — N390 Urinary tract infection, site not specified: Secondary | ICD-10-CM | POA: Diagnosis not present

## 2024-01-19 DIAGNOSIS — Z604 Social exclusion and rejection: Secondary | ICD-10-CM | POA: Diagnosis not present

## 2024-01-19 DIAGNOSIS — Z9181 History of falling: Secondary | ICD-10-CM | POA: Diagnosis not present

## 2024-01-19 DIAGNOSIS — G35 Multiple sclerosis: Secondary | ICD-10-CM | POA: Diagnosis not present

## 2024-01-19 NOTE — Progress Notes (Signed)
COVID/flu ordered to test for infection

## 2024-01-20 ENCOUNTER — Telehealth: Payer: Self-pay

## 2024-01-20 NOTE — Progress Notes (Signed)
Transition Care Management Unsuccessful Follow-up Telephone Call  Date of discharge and from where:  01/14/2024 [name]  Attempts:  1st Attempt  Reason for unsuccessful TCM follow-up call:  No answer/busy  Lorilynn Lehr [facility]  [facility] Guide Direct Dial: [phone]  Fax: [phone] Website: [email or web address]

## 2024-01-21 ENCOUNTER — Telehealth: Payer: Self-pay

## 2024-01-21 DIAGNOSIS — Z604 Social exclusion and rejection: Secondary | ICD-10-CM | POA: Diagnosis not present

## 2024-01-21 DIAGNOSIS — Z9181 History of falling: Secondary | ICD-10-CM | POA: Diagnosis not present

## 2024-01-21 DIAGNOSIS — F17210 Nicotine dependence, cigarettes, uncomplicated: Secondary | ICD-10-CM | POA: Diagnosis not present

## 2024-01-21 DIAGNOSIS — N390 Urinary tract infection, site not specified: Secondary | ICD-10-CM | POA: Diagnosis not present

## 2024-01-21 DIAGNOSIS — G35 Multiple sclerosis: Secondary | ICD-10-CM | POA: Diagnosis not present

## 2024-01-21 DIAGNOSIS — Z89022 Acquired absence of left finger(s): Secondary | ICD-10-CM | POA: Diagnosis not present

## 2024-01-21 DIAGNOSIS — Z993 Dependence on wheelchair: Secondary | ICD-10-CM | POA: Diagnosis not present

## 2024-01-21 NOTE — Progress Notes (Signed)
Transition Care Management Unsuccessful Follow-up Telephone Call  Date of discharge and from where:  01/14/2024 [name]  Attempts:  2nd Attempt  Reason for unsuccessful TCM follow-up call:  No answer/busy  Nathan Marsh [facility]  [facility] Guide Direct Dial: [phone]  Fax: [phone] Website: [email or web address]

## 2024-01-25 DIAGNOSIS — N390 Urinary tract infection, site not specified: Secondary | ICD-10-CM | POA: Diagnosis not present

## 2024-01-25 DIAGNOSIS — Z9181 History of falling: Secondary | ICD-10-CM | POA: Diagnosis not present

## 2024-01-25 DIAGNOSIS — Z993 Dependence on wheelchair: Secondary | ICD-10-CM | POA: Diagnosis not present

## 2024-01-25 DIAGNOSIS — Z89022 Acquired absence of left finger(s): Secondary | ICD-10-CM | POA: Diagnosis not present

## 2024-01-25 DIAGNOSIS — F17210 Nicotine dependence, cigarettes, uncomplicated: Secondary | ICD-10-CM | POA: Diagnosis not present

## 2024-01-25 DIAGNOSIS — G35 Multiple sclerosis: Secondary | ICD-10-CM | POA: Diagnosis not present

## 2024-01-25 DIAGNOSIS — Z604 Social exclusion and rejection: Secondary | ICD-10-CM | POA: Diagnosis not present

## 2024-01-26 ENCOUNTER — Telehealth: Payer: Self-pay | Admitting: *Deleted

## 2024-01-26 NOTE — Telephone Encounter (Signed)
Nathan Marsh, okay to order condom catheter for patient?

## 2024-01-26 NOTE — Telephone Encounter (Signed)
Copied from CRM [id number]. Topic: Clinical - Medical Advice >> Jan 26, 2024  3:28 PM Sonny Dandy B wrote: Reason for CRM: Joi hurd, from [employer]. Called regarding pt. States pt is requesting a condom cath.  Requesting a callback [phone] to discuss pt's needs.

## 2024-01-28 NOTE — Telephone Encounter (Signed)
Spoke to Ms. Hurd with [facility], told her okay to order condom catheter for patient per [facility]. Ms. Greggory Stallion said she can not order it, she will reach out to [facility] and see if they can order it for him. Told her okay just have them send [place] the order if they can. Ms. Greggory Stallion verbalized understanding.

## 2024-01-31 ENCOUNTER — Telehealth: Payer: Self-pay | Admitting: Physician Assistant

## 2024-01-31 DIAGNOSIS — Z9181 History of falling: Secondary | ICD-10-CM | POA: Diagnosis not present

## 2024-01-31 DIAGNOSIS — Z604 Social exclusion and rejection: Secondary | ICD-10-CM | POA: Diagnosis not present

## 2024-01-31 DIAGNOSIS — G35 Multiple sclerosis: Secondary | ICD-10-CM | POA: Diagnosis not present

## 2024-01-31 DIAGNOSIS — F17210 Nicotine dependence, cigarettes, uncomplicated: Secondary | ICD-10-CM | POA: Diagnosis not present

## 2024-01-31 DIAGNOSIS — Z993 Dependence on wheelchair: Secondary | ICD-10-CM | POA: Diagnosis not present

## 2024-01-31 DIAGNOSIS — N390 Urinary tract infection, site not specified: Secondary | ICD-10-CM | POA: Diagnosis not present

## 2024-01-31 DIAGNOSIS — Z89022 Acquired absence of left finger(s): Secondary | ICD-10-CM | POA: Diagnosis not present

## 2024-01-31 NOTE — Telephone Encounter (Signed)
Grier Rocher with [facility], left detailed message on her personal voicemail, verbal order given for Urgent Home Health Medical Social worker okay per [name], Please call back if you need anything else from me.

## 2024-01-31 NOTE — Telephone Encounter (Unsigned)
Copied from CRM [id number]. Topic: General - Other >> Jan 31, 2024 12:43 PM Kathryne Eriksson wrote: Reason for CRM: [facility] For Medical Social Worker >> Jan 31, 2024 12:49 PM Kathryne Eriksson wrote: Archie Patten "[facility]" is requesting home health services for medical social worker. States that the patients home is being condemned.  Further question, feel free to call [phone].

## 2024-01-31 NOTE — Telephone Encounter (Signed)
Samantha please see message. Do you want referral STAT?

## 2024-02-02 DIAGNOSIS — G35 Multiple sclerosis: Secondary | ICD-10-CM | POA: Diagnosis not present

## 2024-02-02 DIAGNOSIS — Z993 Dependence on wheelchair: Secondary | ICD-10-CM | POA: Diagnosis not present

## 2024-02-02 DIAGNOSIS — N390 Urinary tract infection, site not specified: Secondary | ICD-10-CM | POA: Diagnosis not present

## 2024-02-02 DIAGNOSIS — F17210 Nicotine dependence, cigarettes, uncomplicated: Secondary | ICD-10-CM | POA: Diagnosis not present

## 2024-02-02 DIAGNOSIS — Z89022 Acquired absence of left finger(s): Secondary | ICD-10-CM | POA: Diagnosis not present

## 2024-02-02 DIAGNOSIS — Z9181 History of falling: Secondary | ICD-10-CM | POA: Diagnosis not present

## 2024-02-02 DIAGNOSIS — Z604 Social exclusion and rejection: Secondary | ICD-10-CM | POA: Diagnosis not present

## 2024-02-03 DIAGNOSIS — G35 Multiple sclerosis: Secondary | ICD-10-CM | POA: Diagnosis not present

## 2024-02-09 DIAGNOSIS — Z993 Dependence on wheelchair: Secondary | ICD-10-CM | POA: Diagnosis not present

## 2024-02-09 DIAGNOSIS — Z9181 History of falling: Secondary | ICD-10-CM | POA: Diagnosis not present

## 2024-02-09 DIAGNOSIS — N390 Urinary tract infection, site not specified: Secondary | ICD-10-CM | POA: Diagnosis not present

## 2024-02-09 DIAGNOSIS — G35 Multiple sclerosis: Secondary | ICD-10-CM | POA: Diagnosis not present

## 2024-02-09 DIAGNOSIS — Z89022 Acquired absence of left finger(s): Secondary | ICD-10-CM | POA: Diagnosis not present

## 2024-02-09 DIAGNOSIS — Z604 Social exclusion and rejection: Secondary | ICD-10-CM | POA: Diagnosis not present

## 2024-02-09 DIAGNOSIS — F17210 Nicotine dependence, cigarettes, uncomplicated: Secondary | ICD-10-CM | POA: Diagnosis not present

## 2024-02-11 ENCOUNTER — Telehealth: Payer: Self-pay | Admitting: *Deleted

## 2024-02-11 NOTE — Telephone Encounter (Signed)
Please see message.
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
## 2024-02-11 NOTE — Telephone Encounter (Signed)
Called mobile number and spoke to [facility] pt's caregiver. Asked her if she was with patient now. Nathan Marsh said no, she goes back again at 5:00 PM. Told her we received information from OT regarding pt's situation. [name] would like him to make an appt to discuss. Nathan Marsh said she can make appt for him now needs a few days to schedule transportation. Told her okay how about next Thursday at 9:40. Nathan Marsh said that is fine she will let pt know.

## 2024-02-15 DIAGNOSIS — Z89022 Acquired absence of left finger(s): Secondary | ICD-10-CM | POA: Diagnosis not present

## 2024-02-15 DIAGNOSIS — Z604 Social exclusion and rejection: Secondary | ICD-10-CM | POA: Diagnosis not present

## 2024-02-15 DIAGNOSIS — Z993 Dependence on wheelchair: Secondary | ICD-10-CM | POA: Diagnosis not present

## 2024-02-15 DIAGNOSIS — N390 Urinary tract infection, site not specified: Secondary | ICD-10-CM | POA: Diagnosis not present

## 2024-02-15 DIAGNOSIS — F17210 Nicotine dependence, cigarettes, uncomplicated: Secondary | ICD-10-CM | POA: Diagnosis not present

## 2024-02-15 DIAGNOSIS — G35 Multiple sclerosis: Secondary | ICD-10-CM | POA: Diagnosis not present

## 2024-02-15 DIAGNOSIS — Z9181 History of falling: Secondary | ICD-10-CM | POA: Diagnosis not present

## 2024-02-17 ENCOUNTER — Ambulatory Visit: Payer: 59 | Admitting: Physician Assistant

## 2024-02-17 ENCOUNTER — Ambulatory Visit (INDEPENDENT_AMBULATORY_CARE_PROVIDER_SITE_OTHER): Payer: 59 | Admitting: Physician Assistant

## 2024-02-17 ENCOUNTER — Encounter: Payer: Self-pay | Admitting: Physician Assistant

## 2024-02-17 VITALS — BP 130/80 | HR 58 | Temp 97.9°F | Ht 66.0 in | Wt 118.0 lb

## 2024-02-17 DIAGNOSIS — Z9181 History of falling: Secondary | ICD-10-CM | POA: Diagnosis not present

## 2024-02-17 DIAGNOSIS — N390 Urinary tract infection, site not specified: Secondary | ICD-10-CM | POA: Diagnosis not present

## 2024-02-17 DIAGNOSIS — F17210 Nicotine dependence, cigarettes, uncomplicated: Secondary | ICD-10-CM | POA: Diagnosis not present

## 2024-02-17 DIAGNOSIS — Z5989 Other problems related to housing and economic circumstances: Secondary | ICD-10-CM | POA: Diagnosis not present

## 2024-02-17 DIAGNOSIS — R262 Difficulty in walking, not elsewhere classified: Secondary | ICD-10-CM | POA: Diagnosis not present

## 2024-02-17 DIAGNOSIS — Z993 Dependence on wheelchair: Secondary | ICD-10-CM | POA: Diagnosis not present

## 2024-02-17 DIAGNOSIS — Z604 Social exclusion and rejection: Secondary | ICD-10-CM | POA: Diagnosis not present

## 2024-02-17 DIAGNOSIS — G35 Multiple sclerosis: Secondary | ICD-10-CM | POA: Diagnosis not present

## 2024-02-17 DIAGNOSIS — Z89022 Acquired absence of left finger(s): Secondary | ICD-10-CM | POA: Diagnosis not present

## 2024-02-17 NOTE — Progress Notes (Signed)
 Nathan Marsh is a 48 y.o. male here for a follow up of a pre-existing problem.  History of Present Illness:   Chief Complaint  Patient presents with   Mulitiple Sclerosis    Here for f/u and discuss home situation.    HPI   He is here with his aide, Deanna Artis.  He is here to follow-up on messages that I received on a phone call from his occupational therapist, this was in regards to his current living situation  He lives in [address] 8 housing He reports that his housing did not pass inspection and he has been told to vacate on 02/25/24 He does not have anywhere set up for him to live as of March 1 His caregiver with him today has been helping him apply for housing but states that all places that she has found have usually a 30-day process to enroll  Initially there was an agreement between his dad and the property manager that his dad would maintain all repairs needed for this home, however Jahad reports that ever since a family issue involving court occurred last year, his dad has not been able to have proper communication with him  Adult Protective Services has been out to assess his living situation  There have been reports that he is not getting regular access to food     Past Medical History:  Diagnosis Date   Multiple sclerosis (HCC)      Social History   Tobacco Use   Smoking status: Every Day    Current packs/day: 0.50    Average packs/day: 0.5 packs/day for 23.7 years (11.9 ttl pk-yrs)    Types: Cigarettes    Start date: 05/31/2000   Smokeless tobacco: Never  Vaping Use   Vaping status: Never Used  Substance Use Topics   Alcohol use: Yes    Comment: very limited   Drug use: Yes    Types: Marijuana    Past Surgical History:  Procedure Laterality Date   AMPUTATION Left 04/27/2017   Procedure: AMPUTATION DIGIT REVISION LEFT MIDDLE FINGER;  Surgeon: Knute Neu, MD;  Location: [facility] OR;  Service: Orthopedics;  Laterality: Left;    Family History  Problem  Relation Age of Onset   Cancer Mother        lung    No Known Allergies  Current Medications:   Current Outpatient Medications:    Misc. Devices (TRANSFER BOARD) MISC, Use as needed for transfer from wheelchair, Disp: 1 each, Rfl: 0   oxybutynin (DITROPAN) 5 MG tablet, Take 1 tablet (5 mg total) by mouth 2 (two) times daily., Disp: 60 tablet, Rfl: 5   Review of Systems:   Negative unless otherwise specified per HPI.  Vitals:   Vitals:   02/17/24 1057  BP: 130/80  Pulse: (!) 58  Temp: 97.9 F (36.6 C)  TempSrc: Temporal  SpO2: 98%  Weight: 118 lb (53.5 kg)  Height: 5\' 6"  (1.676 m)     Body mass index is 19.05 kg/m.  Physical Exam:   Physical Exam Vitals and nursing note reviewed.  Constitutional:      Appearance: He is well-developed.  HENT:     Head: Normocephalic.  Eyes:     Conjunctiva/sclera: Conjunctivae normal.     Pupils: Pupils are equal, round, and reactive to light.  Pulmonary:     Effort: Pulmonary effort is normal.  Musculoskeletal:        General: Normal range of motion.     Cervical back: Normal range  of motion.  Skin:    General: Skin is warm and dry.  Neurological:     Mental Status: He is alert and oriented to person, place, and time.  Psychiatric:        Speech: Speech is delayed.        Behavior: Behavior is slowed.        Thought Content: Thought content normal.        Judgment: Judgment normal.     Assessment and Plan:   1. Distressed about housing issues (Primary)  2. Unable to walk   We reviewed his current situation and next steps. Per his caregiver he does have the ability to spend 10 to 15 days in extended stay while trying to secure housing Patient seems quite reluctant to reach out to family to see if they can help with his current issues Patient declines any further needs from our office at this time He is connected with a Child psychotherapist who works closely with him per his report He denies any suicidal or homicidal  ideation  I did ask him to follow-up with me in 3 months so we can check in on how he is doing  I spent a total of 30 minutes on this visit, today 02/17/24, which included reviewing previous notes from OT, discussing plan of care with patient and using shared-decision making on next steps, and documenting the findings in the note.   Jarold Motto, PA-C

## 2024-02-22 DIAGNOSIS — Z89022 Acquired absence of left finger(s): Secondary | ICD-10-CM | POA: Diagnosis not present

## 2024-02-22 DIAGNOSIS — Z604 Social exclusion and rejection: Secondary | ICD-10-CM | POA: Diagnosis not present

## 2024-02-22 DIAGNOSIS — F17210 Nicotine dependence, cigarettes, uncomplicated: Secondary | ICD-10-CM | POA: Diagnosis not present

## 2024-02-22 DIAGNOSIS — Z9181 History of falling: Secondary | ICD-10-CM | POA: Diagnosis not present

## 2024-02-22 DIAGNOSIS — N390 Urinary tract infection, site not specified: Secondary | ICD-10-CM | POA: Diagnosis not present

## 2024-02-22 DIAGNOSIS — Z993 Dependence on wheelchair: Secondary | ICD-10-CM | POA: Diagnosis not present

## 2024-02-22 DIAGNOSIS — G35 Multiple sclerosis: Secondary | ICD-10-CM | POA: Diagnosis not present

## 2024-03-01 DIAGNOSIS — Z89022 Acquired absence of left finger(s): Secondary | ICD-10-CM | POA: Diagnosis not present

## 2024-03-01 DIAGNOSIS — Z604 Social exclusion and rejection: Secondary | ICD-10-CM | POA: Diagnosis not present

## 2024-03-01 DIAGNOSIS — G35 Multiple sclerosis: Secondary | ICD-10-CM | POA: Diagnosis not present

## 2024-03-01 DIAGNOSIS — Z9181 History of falling: Secondary | ICD-10-CM | POA: Diagnosis not present

## 2024-03-01 DIAGNOSIS — F17210 Nicotine dependence, cigarettes, uncomplicated: Secondary | ICD-10-CM | POA: Diagnosis not present

## 2024-03-01 DIAGNOSIS — Z993 Dependence on wheelchair: Secondary | ICD-10-CM | POA: Diagnosis not present

## 2024-03-01 DIAGNOSIS — N390 Urinary tract infection, site not specified: Secondary | ICD-10-CM | POA: Diagnosis not present

## 2024-03-02 DIAGNOSIS — G35 Multiple sclerosis: Secondary | ICD-10-CM | POA: Diagnosis not present

## 2024-03-07 ENCOUNTER — Telehealth: Payer: Self-pay | Admitting: Physician Assistant

## 2024-03-07 DIAGNOSIS — G35 Multiple sclerosis: Secondary | ICD-10-CM | POA: Diagnosis not present

## 2024-03-07 NOTE — Telephone Encounter (Signed)
 Received faxed document Home Health Certificate (Order ID [id number]), to be filled out by provider. Patient requested to send it back via Fax . Document is located in providers tray at front office.Please advise

## 2024-03-08 ENCOUNTER — Telehealth: Payer: Self-pay | Admitting: *Deleted

## 2024-03-08 NOTE — Telephone Encounter (Signed)
 Home Health forms signed and faxed back to [facility].

## 2024-03-08 NOTE — Telephone Encounter (Signed)
 [employer] papers signed and faxed back.

## 2024-03-08 NOTE — Telephone Encounter (Signed)
 Copied from CRM [id number]. Topic: Referral - Status >> Mar 08, 2024  3:12 PM Isabelle Course C wrote: Reason for CRM: JeffreyFrom [employer] called in inquiring about referral for patient. ([employer]/gallery conservator) He will be refaxing paperwork tody

## 2024-03-29 DIAGNOSIS — R531 Weakness: Secondary | ICD-10-CM | POA: Diagnosis not present

## 2024-03-29 DIAGNOSIS — G35 Multiple sclerosis: Secondary | ICD-10-CM | POA: Diagnosis not present

## 2024-04-03 DIAGNOSIS — Z993 Dependence on wheelchair: Secondary | ICD-10-CM | POA: Diagnosis not present

## 2024-04-03 DIAGNOSIS — Z9181 History of falling: Secondary | ICD-10-CM | POA: Diagnosis not present

## 2024-04-03 DIAGNOSIS — Z604 Social exclusion and rejection: Secondary | ICD-10-CM | POA: Diagnosis not present

## 2024-04-03 DIAGNOSIS — G35 Multiple sclerosis: Secondary | ICD-10-CM | POA: Diagnosis not present

## 2024-04-03 DIAGNOSIS — Z87440 Personal history of urinary (tract) infections: Secondary | ICD-10-CM | POA: Diagnosis not present

## 2024-04-03 DIAGNOSIS — F17210 Nicotine dependence, cigarettes, uncomplicated: Secondary | ICD-10-CM | POA: Diagnosis not present

## 2024-04-03 DIAGNOSIS — Z89022 Acquired absence of left finger(s): Secondary | ICD-10-CM | POA: Diagnosis not present

## 2024-04-14 DIAGNOSIS — G35 Multiple sclerosis: Secondary | ICD-10-CM | POA: Diagnosis not present

## 2024-04-27 ENCOUNTER — Encounter (HOSPITAL_COMMUNITY): Payer: Self-pay

## 2024-04-27 ENCOUNTER — Other Ambulatory Visit: Payer: Self-pay

## 2024-04-27 ENCOUNTER — Emergency Department (HOSPITAL_COMMUNITY)
Admission: EM | Admit: 2024-04-27 | Discharge: 2024-04-27 | Disposition: A | Discharge status: Home / Self Care | Attending: Emergency Medicine | Admitting: Emergency Medicine

## 2024-04-27 DIAGNOSIS — S3991XA Unspecified injury of abdomen, initial encounter: Secondary | ICD-10-CM | POA: Insufficient documentation

## 2024-04-27 DIAGNOSIS — Z7401 Bed confinement status: Secondary | ICD-10-CM | POA: Diagnosis not present

## 2024-04-27 DIAGNOSIS — Z041 Encounter for examination and observation following transport accident: Secondary | ICD-10-CM | POA: Diagnosis not present

## 2024-04-27 DIAGNOSIS — W19XXXA Unspecified fall, initial encounter: Secondary | ICD-10-CM | POA: Diagnosis not present

## 2024-04-27 DIAGNOSIS — Z743 Need for continuous supervision: Secondary | ICD-10-CM | POA: Diagnosis not present

## 2024-04-27 DIAGNOSIS — W052XXA Fall from non-moving motorized mobility scooter, initial encounter: Secondary | ICD-10-CM | POA: Diagnosis not present

## 2024-04-27 DIAGNOSIS — R29898 Other symptoms and signs involving the musculoskeletal system: Secondary | ICD-10-CM | POA: Diagnosis not present
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
## 2024-04-27 NOTE — ED Triage Notes (Signed)
 Pt BIB [facility] from home with reports of a fall pt hit his right side of abdomen. Pt fell out of his motorized wheelchair. Pt denies hitting his head.
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
## 2024-05-08 ENCOUNTER — Telehealth: Payer: Self-pay | Admitting: Physician Assistant

## 2024-05-08 NOTE — Telephone Encounter (Signed)
 ATC Pt (no VM-not set up) to schedule OV for supplies

## 2024-05-16 ENCOUNTER — Ambulatory Visit: Payer: 59 | Admitting: Physician Assistant

## 2024-05-17 DIAGNOSIS — G35 Multiple sclerosis: Secondary | ICD-10-CM | POA: Diagnosis not present

## 2024-05-23 ENCOUNTER — Ambulatory Visit (INDEPENDENT_AMBULATORY_CARE_PROVIDER_SITE_OTHER): Admitting: Physician Assistant

## 2024-05-23 ENCOUNTER — Emergency Department (HOSPITAL_BASED_OUTPATIENT_CLINIC_OR_DEPARTMENT_OTHER)

## 2024-05-23 ENCOUNTER — Emergency Department (HOSPITAL_BASED_OUTPATIENT_CLINIC_OR_DEPARTMENT_OTHER)
Admission: EM | Admit: 2024-05-23 | Discharge: 2024-05-23 | Disposition: A | Discharge status: Other Acute Inpatient Hospital | Attending: Emergency Medicine | Admitting: Emergency Medicine

## 2024-05-23 ENCOUNTER — Other Ambulatory Visit: Payer: Self-pay

## 2024-05-23 ENCOUNTER — Encounter (HOSPITAL_BASED_OUTPATIENT_CLINIC_OR_DEPARTMENT_OTHER): Payer: Self-pay | Admitting: Radiology

## 2024-05-23 ENCOUNTER — Encounter: Payer: Self-pay | Admitting: Physician Assistant

## 2024-05-23 VITALS — BP 148/90 | HR 80 | Temp 97.9°F | Ht 66.0 in

## 2024-05-23 DIAGNOSIS — A419 Sepsis, unspecified organism: Secondary | ICD-10-CM | POA: Diagnosis not present

## 2024-05-23 DIAGNOSIS — T2122XA Burn of second degree of abdominal wall, initial encounter: Secondary | ICD-10-CM | POA: Insufficient documentation

## 2024-05-23 DIAGNOSIS — T2102XA Burn of unspecified degree of abdominal wall, initial encounter: Secondary | ICD-10-CM | POA: Diagnosis not present

## 2024-05-23 DIAGNOSIS — Z4682 Encounter for fitting and adjustment of non-vascular catheter: Secondary | ICD-10-CM | POA: Diagnosis not present

## 2024-05-23 DIAGNOSIS — R6520 Severe sepsis without septic shock: Secondary | ICD-10-CM | POA: Diagnosis not present

## 2024-05-23 DIAGNOSIS — I6782 Cerebral ischemia: Secondary | ICD-10-CM | POA: Diagnosis not present

## 2024-05-23 DIAGNOSIS — N179 Acute kidney failure, unspecified: Secondary | ICD-10-CM | POA: Insufficient documentation

## 2024-05-23 DIAGNOSIS — R140 Abdominal distension (gaseous): Secondary | ICD-10-CM | POA: Diagnosis not present

## 2024-05-23 DIAGNOSIS — Z6822 Body mass index (BMI) 22.0-22.9, adult: Secondary | ICD-10-CM | POA: Diagnosis not present

## 2024-05-23 DIAGNOSIS — M6282 Rhabdomyolysis: Secondary | ICD-10-CM | POA: Diagnosis not present

## 2024-05-23 DIAGNOSIS — T2121XA Burn of second degree of chest wall, initial encounter: Secondary | ICD-10-CM | POA: Diagnosis not present

## 2024-05-23 DIAGNOSIS — T310 Burns involving less than 10% of body surface: Secondary | ICD-10-CM | POA: Diagnosis not present

## 2024-05-23 DIAGNOSIS — Z9081 Acquired absence of spleen: Secondary | ICD-10-CM | POA: Diagnosis not present

## 2024-05-23 DIAGNOSIS — R5381 Other malaise: Secondary | ICD-10-CM | POA: Diagnosis not present

## 2024-05-23 DIAGNOSIS — R1312 Dysphagia, oropharyngeal phase: Secondary | ICD-10-CM | POA: Diagnosis not present

## 2024-05-23 DIAGNOSIS — R32 Unspecified urinary incontinence: Secondary | ICD-10-CM | POA: Diagnosis not present

## 2024-05-23 DIAGNOSIS — K9189 Other postprocedural complications and disorders of digestive system: Secondary | ICD-10-CM | POA: Diagnosis not present

## 2024-05-23 DIAGNOSIS — G35 Multiple sclerosis: Secondary | ICD-10-CM | POA: Diagnosis not present

## 2024-05-23 DIAGNOSIS — N319 Neuromuscular dysfunction of bladder, unspecified: Secondary | ICD-10-CM | POA: Diagnosis not present

## 2024-05-23 DIAGNOSIS — S3991XA Unspecified injury of abdomen, initial encounter: Secondary | ICD-10-CM | POA: Diagnosis present

## 2024-05-23 DIAGNOSIS — E872 Acidosis, unspecified: Secondary | ICD-10-CM | POA: Diagnosis not present

## 2024-05-23 DIAGNOSIS — J9811 Atelectasis: Secondary | ICD-10-CM | POA: Diagnosis not present

## 2024-05-23 DIAGNOSIS — R5383 Other fatigue: Secondary | ICD-10-CM | POA: Diagnosis not present

## 2024-05-23 DIAGNOSIS — J9819 Other pulmonary collapse: Secondary | ICD-10-CM | POA: Diagnosis not present

## 2024-05-23 DIAGNOSIS — J984 Other disorders of lung: Secondary | ICD-10-CM | POA: Diagnosis not present

## 2024-05-23 DIAGNOSIS — X19XXXA Contact with other heat and hot substances, initial encounter: Secondary | ICD-10-CM | POA: Insufficient documentation

## 2024-05-23 DIAGNOSIS — R000 Tachycardia, unspecified: Secondary | ICD-10-CM | POA: Diagnosis not present

## 2024-05-23 DIAGNOSIS — R279 Unspecified lack of coordination: Secondary | ICD-10-CM | POA: Diagnosis not present

## 2024-05-23 DIAGNOSIS — K558 Other vascular disorders of intestine: Secondary | ICD-10-CM | POA: Diagnosis not present

## 2024-05-23 DIAGNOSIS — E876 Hypokalemia: Secondary | ICD-10-CM | POA: Diagnosis not present

## 2024-05-23 DIAGNOSIS — E875 Hyperkalemia: Secondary | ICD-10-CM | POA: Diagnosis not present

## 2024-05-23 DIAGNOSIS — K668 Other specified disorders of peritoneum: Secondary | ICD-10-CM | POA: Diagnosis not present

## 2024-05-23 DIAGNOSIS — R1310 Dysphagia, unspecified: Secondary | ICD-10-CM | POA: Diagnosis not present

## 2024-05-23 DIAGNOSIS — Z933 Colostomy status: Secondary | ICD-10-CM | POA: Diagnosis not present

## 2024-05-23 DIAGNOSIS — M6281 Muscle weakness (generalized): Secondary | ICD-10-CM | POA: Diagnosis not present

## 2024-05-23 DIAGNOSIS — R945 Abnormal results of liver function studies: Secondary | ICD-10-CM | POA: Insufficient documentation

## 2024-05-23 DIAGNOSIS — R918 Other nonspecific abnormal finding of lung field: Secondary | ICD-10-CM | POA: Diagnosis not present

## 2024-05-23 DIAGNOSIS — K567 Ileus, unspecified: Secondary | ICD-10-CM | POA: Diagnosis not present

## 2024-05-23 DIAGNOSIS — R0902 Hypoxemia: Secondary | ICD-10-CM | POA: Diagnosis not present

## 2024-05-23 DIAGNOSIS — S36030A Superficial (capsular) laceration of spleen, initial encounter: Secondary | ICD-10-CM | POA: Diagnosis not present

## 2024-05-23 DIAGNOSIS — Z4659 Encounter for fitting and adjustment of other gastrointestinal appliance and device: Secondary | ICD-10-CM | POA: Diagnosis not present

## 2024-05-23 DIAGNOSIS — L0231 Cutaneous abscess of buttock: Secondary | ICD-10-CM | POA: Diagnosis not present

## 2024-05-23 DIAGNOSIS — J690 Pneumonitis due to inhalation of food and vomit: Secondary | ICD-10-CM | POA: Diagnosis not present

## 2024-05-23 DIAGNOSIS — T300 Burn of unspecified body region, unspecified degree: Secondary | ICD-10-CM

## 2024-05-23 DIAGNOSIS — J90 Pleural effusion, not elsewhere classified: Secondary | ICD-10-CM | POA: Diagnosis not present

## 2024-05-23 DIAGNOSIS — K551 Chronic vascular disorders of intestine: Secondary | ICD-10-CM | POA: Diagnosis not present

## 2024-05-23 DIAGNOSIS — J9601 Acute respiratory failure with hypoxia: Secondary | ICD-10-CM | POA: Diagnosis not present

## 2024-05-23 DIAGNOSIS — R531 Weakness: Secondary | ICD-10-CM | POA: Diagnosis not present

## 2024-05-23 DIAGNOSIS — E441 Mild protein-calorie malnutrition: Secondary | ICD-10-CM | POA: Diagnosis not present

## 2024-05-23 DIAGNOSIS — K769 Liver disease, unspecified: Secondary | ICD-10-CM | POA: Diagnosis not present

## 2024-05-23 DIAGNOSIS — D649 Anemia, unspecified: Secondary | ICD-10-CM | POA: Diagnosis not present

## 2024-05-23 DIAGNOSIS — R109 Unspecified abdominal pain: Secondary | ICD-10-CM | POA: Diagnosis not present

## 2024-05-23 DIAGNOSIS — T2132XA Burn of third degree of abdominal wall, initial encounter: Secondary | ICD-10-CM | POA: Diagnosis not present

## 2024-05-23 DIAGNOSIS — R278 Other lack of coordination: Secondary | ICD-10-CM | POA: Diagnosis not present

## 2024-05-23 DIAGNOSIS — K55039 Acute (reversible) ischemia of large intestine, extent unspecified: Secondary | ICD-10-CM | POA: Diagnosis not present

## 2024-05-23 DIAGNOSIS — R7989 Other specified abnormal findings of blood chemistry: Secondary | ICD-10-CM

## 2024-05-23 DIAGNOSIS — K559 Vascular disorder of intestine, unspecified: Secondary | ICD-10-CM | POA: Diagnosis not present

## 2024-05-23 DIAGNOSIS — I499 Cardiac arrhythmia, unspecified: Secondary | ICD-10-CM | POA: Diagnosis not present

## 2024-05-23 DIAGNOSIS — Z743 Need for continuous supervision: Secondary | ICD-10-CM | POA: Diagnosis not present

## 2024-05-23 DIAGNOSIS — G250 Essential tremor: Secondary | ICD-10-CM | POA: Diagnosis not present

## 2024-05-23 DIAGNOSIS — Z978 Presence of other specified devices: Secondary | ICD-10-CM | POA: Diagnosis not present

## 2024-05-23 DIAGNOSIS — R188 Other ascites: Secondary | ICD-10-CM | POA: Diagnosis not present

## 2024-05-23 DIAGNOSIS — Z993 Dependence on wheelchair: Secondary | ICD-10-CM | POA: Diagnosis not present

## 2024-05-23 LAB — CBC WITH DIFFERENTIAL/PLATELET
Abs Immature Granulocytes: 0.21 10*3/uL — ABNORMAL HIGH (ref 0.00–0.07)
Basophils Absolute: 0 10*3/uL (ref 0.0–0.1)
Basophils Relative: 0 %
Eosinophils Absolute: 0 10*3/uL (ref 0.0–0.5)
Eosinophils Relative: 0 %
HCT: 48.1 % (ref 39.0–52.0)
Hemoglobin: 14.6 g/dL (ref 13.0–17.0)
Immature Granulocytes: 1 %
Lymphocytes Relative: 6 %
Lymphs Abs: 1.3 10*3/uL (ref 0.7–4.0)
MCH: 23.2 pg — ABNORMAL LOW (ref 26.0–34.0)
MCHC: 30.4 g/dL (ref 30.0–36.0)
MCV: 76.6 fL — ABNORMAL LOW (ref 80.0–100.0)
Monocytes Absolute: 2.3 10*3/uL — ABNORMAL HIGH (ref 0.1–1.0)
Monocytes Relative: 10 %
Neutro Abs: 17.8 10*3/uL — ABNORMAL HIGH (ref 1.7–7.7)
Neutrophils Relative %: 83 %
Platelets: 213 10*3/uL (ref 150–400)
RBC: 6.28 MIL/uL — ABNORMAL HIGH (ref 4.22–5.81)
RDW: 18.2 % — ABNORMAL HIGH (ref 11.5–15.5)
WBC: 21.6 10*3/uL — ABNORMAL HIGH (ref 4.0–10.5)
nRBC: 2.1 % — ABNORMAL HIGH (ref 0.0–0.2)

## 2024-05-23 LAB — COMPREHENSIVE METABOLIC PANEL WITH GFR
ALT: 52 U/L — ABNORMAL HIGH (ref 0–44)
AST: 299 U/L — ABNORMAL HIGH (ref 15–41)
Albumin: 4.8 g/dL (ref 3.5–5.0)
Alkaline Phosphatase: 102 U/L (ref 38–126)
Anion gap: 18 — ABNORMAL HIGH (ref 5–15)
BUN: 27 mg/dL — ABNORMAL HIGH (ref 6–20)
CO2: 23 mmol/L (ref 22–32)
Calcium: 10.3 mg/dL (ref 8.9–10.3)
Chloride: 100 mmol/L (ref 98–111)
Creatinine, Ser: 1.96 mg/dL — ABNORMAL HIGH (ref 0.61–1.24)
GFR, Estimated: 41 mL/min — ABNORMAL LOW (ref >60)
Glucose, Bld: 114 mg/dL — ABNORMAL HIGH (ref 70–99)
Potassium: 6.1 mmol/L — ABNORMAL HIGH (ref 3.5–5.1)
Sodium: 141 mmol/L (ref 135–145)
Total Bilirubin: 0.5 mg/dL (ref 0.0–1.2)
Total Protein: 8.2 g/dL — ABNORMAL HIGH (ref 6.5–8.1)

## 2024-05-23 LAB — URINALYSIS, W/ REFLEX TO CULTURE (INFECTION SUSPECTED)
Bilirubin Urine: NEGATIVE
Glucose, UA: NEGATIVE mg/dL
Leukocytes,Ua: NEGATIVE
Nitrite: NEGATIVE
Protein, ur: 100 mg/dL — AB
Specific Gravity, Urine: 1.02 (ref 1.005–1.030)
pH: 6.5 (ref 5.0–8.0)

## 2024-05-23 LAB — I-STAT VENOUS BLOOD GAS, ED
Acid-Base Excess: 0 mmol/L (ref 0.0–2.0)
Bicarbonate: 26.5 mmol/L (ref 20.0–28.0)
Calcium, Ion: 1.11 mmol/L — ABNORMAL LOW (ref 1.15–1.40)
HCT: 49 % (ref 39.0–52.0)
Hemoglobin: 16.7 g/dL (ref 13.0–17.0)
O2 Saturation: 31 %
Potassium: 5.9 mmol/L — ABNORMAL HIGH (ref 3.5–5.1)
Sodium: 140 mmol/L (ref 135–145)
TCO2: 28 mmol/L (ref 22–32)
pCO2, Ven: 49.3 mmHg (ref 44–60)
pH, Ven: 7.339 (ref 7.25–7.43)
pO2, Ven: 21 mmHg — CL (ref 32–45)

## 2024-05-23 LAB — LACTIC ACID, PLASMA
Lactic Acid, Venous: 4.8 mmol/L (ref 0.5–1.9)
Lactic Acid, Venous: 4.1 mmol/L (ref 0.5–1.9)

## 2024-05-23 LAB — CK: Total CK: >20000 U/L — ABNORMAL HIGH (ref 49–397)

## 2024-05-23 LAB — URINE DRUG SCREEN
Amphetamines: NOT DETECTED
Barbiturates: NOT DETECTED
Benzodiazepines: NOT DETECTED
Cocaine: NOT DETECTED
Fentanyl: NOT DETECTED
Methadone Scn, Ur: NOT DETECTED
Opiates: NOT DETECTED
Tetrahydrocannabinol: DETECTED — AB

## 2024-05-23 LAB — TROPONIN T, HIGH SENSITIVITY
Troponin T High Sensitivity: 56 ng/L — ABNORMAL HIGH (ref <19)
Troponin T High Sensitivity: 61 ng/L — ABNORMAL HIGH (ref <19)

## 2024-05-23 LAB — RESP PANEL BY RT-PCR (RSV, FLU A&B, COVID)  RVPGX2
Influenza A by PCR: NEGATIVE
Influenza B by PCR: NEGATIVE
Resp Syncytial Virus by PCR: NEGATIVE
SARS Coronavirus 2 by RT PCR: NEGATIVE

## 2024-05-23 LAB — AMMONIA: Ammonia: <13 umol/L (ref 9–35)

## 2024-05-23 LAB — POTASSIUM
Potassium: 5.9 mmol/L — ABNORMAL HIGH (ref 3.5–5.1)
Potassium: 5.4 mmol/L — ABNORMAL HIGH (ref 3.5–5.1)

## 2024-05-23 LAB — CBG MONITORING, ED: Glucose-Capillary: 105 mg/dL — ABNORMAL HIGH (ref 70–99)

## 2024-05-23 LAB — ETHANOL: Alcohol, Ethyl (B): <15 mg/dL (ref <15)

## 2024-05-23 LAB — C-REACTIVE PROTEIN: CRP: 7.1 mg/dL — ABNORMAL HIGH (ref <1.0)

## 2024-05-23 MED ORDER — METRONIDAZOLE 500 MG/100ML IV SOLN
500.0000 mg | Freq: Once | INTRAVENOUS | Status: AC
  Administered 2024-05-23: 500 mg via INTRAVENOUS
  Filled 2024-05-23: qty 100

## 2024-05-23 MED ORDER — LACTATED RINGERS IV BOLUS
1000.0000 mL | Freq: Once | INTRAVENOUS | Status: AC
  Administered 2024-05-23: 1000 mL via INTRAVENOUS

## 2024-05-23 MED ORDER — INSULIN ASPART 100 UNIT/ML IV SOLN
5.0000 [IU] | Freq: Once | INTRAVENOUS | Status: AC
  Administered 2024-05-23: 5 [IU] via INTRAVENOUS

## 2024-05-23 MED ORDER — CALCIUM GLUCONATE 10 % IV SOLN
1.0000 g | Freq: Once | INTRAVENOUS | Status: AC
  Administered 2024-05-23: 1 g via INTRAVENOUS
  Filled 2024-05-23: qty 10

## 2024-05-23 MED ORDER — FENTANYL CITRATE PF 50 MCG/ML IJ SOSY
50.0000 ug | PREFILLED_SYRINGE | Freq: Once | INTRAMUSCULAR | Status: AC
  Administered 2024-05-23: 50 ug via INTRAVENOUS
  Filled 2024-05-23: qty 1

## 2024-05-23 MED ORDER — VANCOMYCIN HCL IN DEXTROSE 1-5 GM/200ML-% IV SOLN
1000.0000 mg | Freq: Once | INTRAVENOUS | Status: AC
  Administered 2024-05-23: 1000 mg via INTRAVENOUS
  Filled 2024-05-23: qty 200

## 2024-05-23 MED ORDER — LACTATED RINGERS IV SOLN: INTRAVENOUS | Status: DC

## 2024-05-23 MED ORDER — SODIUM CHLORIDE 0.9 % IV SOLN
2.0000 g | Freq: Once | INTRAVENOUS | Status: AC
  Administered 2024-05-23: 2 g via INTRAVENOUS
  Filled 2024-05-23: qty 12.5

## 2024-05-23 MED ORDER — DEXTROSE 50 % IV SOLN
1.0000 | Freq: Once | INTRAVENOUS | Status: AC
  Administered 2024-05-23: 50 mL via INTRAVENOUS
  Filled 2024-05-23: qty 50

## 2024-05-23 MED ORDER — IOHEXOL 300 MG/ML  SOLN
100.0000 mL | Freq: Once | INTRAMUSCULAR | Status: AC | PRN
  Administered 2024-05-23: 60 mL via INTRAVENOUS
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
## 2024-05-23 NOTE — ED Notes (Signed)
 Patient returned from CT with R forearm 20g infiltrated during scan. Warm compress applied. New IV placed for Antibiotics

## 2024-05-23 NOTE — ED Notes (Signed)
 No sign of blisters and redness spreading outside of previous marking on skin. Provider notified .

## 2024-05-23 NOTE — ED Notes (Signed)
 Blisters marked with skin marker to monitor spread.

## 2024-05-23 NOTE — ED Triage Notes (Addendum)
 In for eval of altered mental status. Patient lives alone, was found by patient care aid in his chair altered with vomit on himself. PMH MS with last treatment on 05/17/2024. Initially non verbal. Now able to answer to name and that he is in no pain at time. Now reports that he woke up around 0700 and was "normal". Denies pain.   FSBS at triage 105
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
## 2024-05-23 NOTE — ED Notes (Signed)
 RN contacted [facility].

## 2024-05-23 NOTE — Patient Instructions (Signed)
 It was great to see you!  Please go to the ER as soon as possible for further evaluation  I'm concerned that you have a severe infection somewhere  Let's follow-up after hospitalization, sooner if you have concerns.  Take care,  [name] PA-C

## 2024-05-23 NOTE — ED Notes (Signed)
 No tele-neuro activation is needed per Adrain Alar, MD.

## 2024-05-23 NOTE — ED Notes (Signed)
 [facility] in ED w/ pt

## 2024-05-23 NOTE — Progress Notes (Signed)
 Nathan Marsh is a 48 y.o. male here for a new problem.  History of Present Illness:   Chief Complaint  Patient presents with   Urinary issues   Pt is accompanied by his health aid.   Fatigue / Weakness: Pt endorses fatigue and weakness and 1 episode of vomiting this morning.  He reports he is uncertain for the cause of his fatigue and weakness, adding he slept well throughout the night.  His aid has checked up on him everyday this weekend and notes he was more alert and active until this morning.  Latest MS treatment was received 6 days ago on 05/17/24, which he states he tolerated well.   Urinary incontinence His aid also has concerns for possible UTI  She has had to change his adult diapers about 3 times a day for multiple years due to his incontinence He is unable to walk and has been incontinent with MS for years Denies any recent falls, abdominal pain, or recent drug use.   Terryl moved to a new apartment complex in March.  His aid states it is wheelchair friendly enough for him to still continue to "get around" just fine.   Past Medical History:  Diagnosis Date   Multiple sclerosis (HCC)      Social History   Tobacco Use   Smoking status: Every Day    Current packs/day: 0.50    Average packs/day: 0.5 packs/day for 24.0 years (12.0 ttl pk-yrs)    Types: Cigarettes    Start date: 05/31/2000   Smokeless tobacco: Never  Vaping Use   Vaping status: Never Used  Substance Use Topics   Alcohol use: Yes    Comment: very limited   Drug use: Yes    Types: Marijuana    Past Surgical History:  Procedure Laterality Date   AMPUTATION Left 04/27/2017   Procedure: AMPUTATION DIGIT REVISION LEFT MIDDLE FINGER;  Surgeon: Mauricia South, MD;  Location: [facility] OR;  Service: Orthopedics;  Laterality: Left;    Family History  Problem Relation Age of Onset   Cancer Mother        lung    No Known Allergies  Current Medications:   Current Outpatient Medications:    baclofen   (LIORESAL ) 10 MG tablet, Take 10 mg by mouth 3 (three) times daily., Disp: , Rfl:    oxybutynin  (DITROPAN ) 5 MG tablet, Take 1 tablet (5 mg total) by mouth 2 (two) times daily., Disp: 60 tablet, Rfl: 5   Review of Systems:   Negative unless otherwise specified per HPI.  Vitals:   Vitals:   05/23/24 0932  BP: (!) 148/90  Pulse: 80  Temp: 97.9 F (36.6 C)  TempSrc: Temporal  SpO2: 97%  Height: 5\' 6"  (1.676 m)     Body mass index is 19.05 kg/m.  Physical Exam:   Physical Exam Vitals and nursing note reviewed.  Constitutional:      Appearance: He is well-developed.  HENT:     Head: Normocephalic.  Eyes:     Conjunctiva/sclera:     Left eye: Left conjunctiva is injected.     Pupils: Pupils are equal, round, and reactive to light.  Pulmonary:     Effort: Pulmonary effort is normal.  Musculoskeletal:        General: Normal range of motion.     Cervical back: Normal range of motion.  Skin:    General: Skin is warm and dry.  Neurological:     Mental Status: He is alert and oriented  to person, place, and time.  Psychiatric:        Attention and Perception: Attention normal.        Speech: Speech is delayed.        Behavior: Behavior is slowed.        Thought Content: Thought content normal.        Judgment: Judgment normal.     Assessment and Plan:   1. Malaise and fatigue (Primary) I have known [name] for several years and have never seen him present this lethargic Concern for urinary tract infection (UTI) vs aspiration pneumonia vs MS flare/exacerbation vs other Recommend ER for evaluation as soon as possible Follow up with us  after hospitalization for ongoing chronic care management   2. Urinary incontinence, unspecified type Due to MS and ongoing chronic urinary incontinence, patient would benefit from ongoing supply of incontinence supplies Will continue to complete paperwork to support this  I, Bernita Bristle, acting as a Neurosurgeon for [name], [name]., have  documented all relevant documentation on the behalf of [name], [name], as directed by   while in the presence of [name], [name].  I, [name], [name], have reviewed all documentation for this visit. The documentation on 05/23/24 for the exam, diagnosis, procedures, and orders are all accurate and complete.  [name], PA-C

## 2024-05-23 NOTE — Sepsis Progress Note (Signed)
 Sepsis protocol monitored by eLink ?

## 2024-05-23 NOTE — ED Notes (Signed)
 Nathan Marsh [phone] [name] [phone]  [facility]

## 2024-05-23 NOTE — ED Notes (Signed)
 Called [facility] to transport patient to [facility] Emergency Dept.  Dr. Toy Freund is accepting
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
## 2024-05-24 DIAGNOSIS — A419 Sepsis, unspecified organism: Secondary | ICD-10-CM | POA: Diagnosis not present

## 2024-05-24 DIAGNOSIS — N179 Acute kidney failure, unspecified: Secondary | ICD-10-CM | POA: Diagnosis not present

## 2024-05-24 DIAGNOSIS — K558 Other vascular disorders of intestine: Secondary | ICD-10-CM | POA: Diagnosis not present

## 2024-05-24 DIAGNOSIS — R6520 Severe sepsis without septic shock: Secondary | ICD-10-CM | POA: Diagnosis not present

## 2024-05-24 DIAGNOSIS — S36030A Superficial (capsular) laceration of spleen, initial encounter: Secondary | ICD-10-CM | POA: Diagnosis not present

## 2024-05-24 DIAGNOSIS — Z4682 Encounter for fitting and adjustment of non-vascular catheter: Secondary | ICD-10-CM | POA: Diagnosis not present

## 2024-05-24 DIAGNOSIS — R188 Other ascites: Secondary | ICD-10-CM | POA: Diagnosis not present

## 2024-05-24 DIAGNOSIS — Z4659 Encounter for fitting and adjustment of other gastrointestinal appliance and device: Secondary | ICD-10-CM | POA: Diagnosis not present

## 2024-05-24 HISTORY — PX: COLOSTOMY: SHX63

## 2024-05-24 HISTORY — PX: SUBTOTAL COLECTOMY: SHX855

## 2024-05-24 HISTORY — PX: SKIN GRAFT SPLIT THICKNESS TRUNK: SUR1304

## 2024-05-24 HISTORY — PX: SPLENECTOMY: SUR1306

## 2024-05-28 DIAGNOSIS — Z4682 Encounter for fitting and adjustment of non-vascular catheter: Secondary | ICD-10-CM | POA: Diagnosis not present

## 2024-05-28 DIAGNOSIS — J9811 Atelectasis: Secondary | ICD-10-CM | POA: Diagnosis not present

## 2024-05-28 DIAGNOSIS — R918 Other nonspecific abnormal finding of lung field: Secondary | ICD-10-CM | POA: Diagnosis not present

## 2024-05-28 DIAGNOSIS — Z4659 Encounter for fitting and adjustment of other gastrointestinal appliance and device: Secondary | ICD-10-CM | POA: Diagnosis not present

## 2024-05-28 LAB — CULTURE, BLOOD (ROUTINE X 2)
Culture: NO GROWTH
Culture: NO GROWTH
Special Requests: ADEQUATE

## 2024-05-29 DIAGNOSIS — Z978 Presence of other specified devices: Secondary | ICD-10-CM | POA: Diagnosis not present

## 2024-05-29 DIAGNOSIS — J90 Pleural effusion, not elsewhere classified: Secondary | ICD-10-CM | POA: Diagnosis not present

## 2024-05-29 DIAGNOSIS — R918 Other nonspecific abnormal finding of lung field: Secondary | ICD-10-CM | POA: Diagnosis not present

## 2024-05-30 DIAGNOSIS — J90 Pleural effusion, not elsewhere classified: Secondary | ICD-10-CM | POA: Diagnosis not present

## 2024-05-30 DIAGNOSIS — Z978 Presence of other specified devices: Secondary | ICD-10-CM | POA: Diagnosis not present

## 2024-05-30 DIAGNOSIS — R918 Other nonspecific abnormal finding of lung field: Secondary | ICD-10-CM | POA: Diagnosis not present

## 2024-05-31 DIAGNOSIS — R140 Abdominal distension (gaseous): Secondary | ICD-10-CM | POA: Diagnosis not present

## 2024-06-08 DIAGNOSIS — R531 Weakness: Secondary | ICD-10-CM | POA: Diagnosis not present

## 2024-06-08 DIAGNOSIS — R059 Cough, unspecified: Secondary | ICD-10-CM | POA: Diagnosis not present

## 2024-06-08 DIAGNOSIS — R278 Other lack of coordination: Secondary | ICD-10-CM | POA: Diagnosis not present

## 2024-06-08 DIAGNOSIS — G35 Multiple sclerosis: Secondary | ICD-10-CM | POA: Diagnosis not present

## 2024-06-08 DIAGNOSIS — J9601 Acute respiratory failure with hypoxia: Secondary | ICD-10-CM | POA: Diagnosis not present

## 2024-06-08 DIAGNOSIS — Z743 Need for continuous supervision: Secondary | ICD-10-CM | POA: Diagnosis not present

## 2024-06-08 DIAGNOSIS — I2510 Atherosclerotic heart disease of native coronary artery without angina pectoris: Secondary | ICD-10-CM | POA: Diagnosis not present

## 2024-06-08 DIAGNOSIS — R39198 Other difficulties with micturition: Secondary | ICD-10-CM | POA: Diagnosis not present

## 2024-06-08 DIAGNOSIS — K551 Chronic vascular disorders of intestine: Secondary | ICD-10-CM | POA: Diagnosis not present

## 2024-06-08 DIAGNOSIS — I499 Cardiac arrhythmia, unspecified: Secondary | ICD-10-CM | POA: Diagnosis not present

## 2024-06-08 DIAGNOSIS — R1312 Dysphagia, oropharyngeal phase: Secondary | ICD-10-CM | POA: Diagnosis not present

## 2024-06-08 DIAGNOSIS — Z9081 Acquired absence of spleen: Secondary | ICD-10-CM | POA: Diagnosis not present

## 2024-06-08 DIAGNOSIS — R279 Unspecified lack of coordination: Secondary | ICD-10-CM | POA: Diagnosis not present

## 2024-06-08 DIAGNOSIS — Z933 Colostomy status: Secondary | ICD-10-CM | POA: Diagnosis not present

## 2024-06-08 DIAGNOSIS — K559 Vascular disorder of intestine, unspecified: Secondary | ICD-10-CM | POA: Diagnosis not present

## 2024-06-08 DIAGNOSIS — T300 Burn of unspecified body region, unspecified degree: Secondary | ICD-10-CM | POA: Diagnosis not present

## 2024-06-08 DIAGNOSIS — D649 Anemia, unspecified: Secondary | ICD-10-CM | POA: Diagnosis not present

## 2024-06-08 DIAGNOSIS — M6281 Muscle weakness (generalized): Secondary | ICD-10-CM | POA: Diagnosis not present

## 2024-06-08 DIAGNOSIS — R000 Tachycardia, unspecified: Secondary | ICD-10-CM | POA: Diagnosis not present
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
## 2024-06-13 DIAGNOSIS — K559 Vascular disorder of intestine, unspecified: Secondary | ICD-10-CM | POA: Diagnosis not present

## 2024-06-13 DIAGNOSIS — T300 Burn of unspecified body region, unspecified degree: Secondary | ICD-10-CM | POA: Diagnosis not present

## 2024-06-13 DIAGNOSIS — I2510 Atherosclerotic heart disease of native coronary artery without angina pectoris: Secondary | ICD-10-CM | POA: Diagnosis not present

## 2024-06-13 DIAGNOSIS — G35 Multiple sclerosis: Secondary | ICD-10-CM | POA: Diagnosis not present

## 2024-06-16 DIAGNOSIS — R059 Cough, unspecified: Secondary | ICD-10-CM | POA: Diagnosis not present

## 2024-06-16 DIAGNOSIS — R39198 Other difficulties with micturition: Secondary | ICD-10-CM | POA: Diagnosis not present

## 2024-06-23 ENCOUNTER — Telehealth: Payer: Self-pay | Admitting: *Deleted

## 2024-06-23 NOTE — Telephone Encounter (Signed)
 Copied from CRM [id number]. Topic: General - Other >> Jun 22, 2024  4:02 PM Tiffany S wrote: Reason for CRM: patient will begin [facility] services June 30

## 2024-06-23 NOTE — Telephone Encounter (Signed)
 Noted

## 2024-06-26 ENCOUNTER — Ambulatory Visit: Admitting: Physician Assistant

## 2024-06-26 ENCOUNTER — Telehealth: Payer: Self-pay | Admitting: *Deleted

## 2024-06-26 DIAGNOSIS — G35 Multiple sclerosis: Secondary | ICD-10-CM | POA: Diagnosis not present

## 2024-06-26 DIAGNOSIS — T2102XD Burn of unspecified degree of abdominal wall, subsequent encounter: Secondary | ICD-10-CM | POA: Diagnosis not present

## 2024-06-26 DIAGNOSIS — K551 Chronic vascular disorders of intestine: Secondary | ICD-10-CM | POA: Diagnosis not present

## 2024-06-26 DIAGNOSIS — Z7189 Other specified counseling: Secondary | ICD-10-CM

## 2024-06-26 DIAGNOSIS — Z604 Social exclusion and rejection: Secondary | ICD-10-CM | POA: Diagnosis not present

## 2024-06-26 DIAGNOSIS — Z556 Problems related to health literacy: Secondary | ICD-10-CM | POA: Diagnosis not present

## 2024-06-26 DIAGNOSIS — Z9049 Acquired absence of other specified parts of digestive tract: Secondary | ICD-10-CM | POA: Diagnosis not present

## 2024-06-26 DIAGNOSIS — Z9081 Acquired absence of spleen: Secondary | ICD-10-CM | POA: Diagnosis not present

## 2024-06-26 DIAGNOSIS — X16XXXD Contact with hot heating appliances, radiators and pipes, subsequent encounter: Secondary | ICD-10-CM | POA: Diagnosis not present

## 2024-06-26 DIAGNOSIS — A419 Sepsis, unspecified organism: Secondary | ICD-10-CM | POA: Diagnosis not present

## 2024-06-26 DIAGNOSIS — Z933 Colostomy status: Secondary | ICD-10-CM | POA: Diagnosis not present

## 2024-06-26 NOTE — Telephone Encounter (Signed)
 Copied from CRM [id number]. Topic: General - Other >> Jun 26, 2024 11:00 AM Deaijah H wrote: Reason for CRM: Keisha PCA (patient care aid) called in to ask if patient could receive a referral to receive supplies for colonoscopy bag.  Please call [phone]

## 2024-06-26 NOTE — Telephone Encounter (Signed)
 Spoke to patients care giver [name], told her I placed referral for Ostomy care, someone will contact him or you to schedule an appointment. [name] verbalized understanding. Also asked why pt missed appt today? [name] said did not know he had an appt today and pt just got home from the hospital and is rescheduled for tomorrow. Told her okay, see him then.

## 2024-06-27 ENCOUNTER — Encounter: Payer: Self-pay | Admitting: Physician Assistant

## 2024-06-27 ENCOUNTER — Ambulatory Visit (INDEPENDENT_AMBULATORY_CARE_PROVIDER_SITE_OTHER): Admitting: Physician Assistant

## 2024-06-27 ENCOUNTER — Telehealth: Payer: Self-pay | Admitting: *Deleted

## 2024-06-27 VITALS — BP 108/70 | HR 65 | Temp 98.8°F

## 2024-06-27 DIAGNOSIS — M79604 Pain in right leg: Secondary | ICD-10-CM | POA: Diagnosis not present

## 2024-06-27 DIAGNOSIS — Z939 Artificial opening status, unspecified: Secondary | ICD-10-CM | POA: Diagnosis not present

## 2024-06-27 DIAGNOSIS — Z8619 Personal history of other infectious and parasitic diseases: Secondary | ICD-10-CM | POA: Insufficient documentation

## 2024-06-27 DIAGNOSIS — M79605 Pain in left leg: Secondary | ICD-10-CM | POA: Diagnosis not present

## 2024-06-27 DIAGNOSIS — T300 Burn of unspecified body region, unspecified degree: Secondary | ICD-10-CM | POA: Insufficient documentation

## 2024-06-27 DIAGNOSIS — Z9081 Acquired absence of spleen: Secondary | ICD-10-CM | POA: Insufficient documentation

## 2024-06-27 LAB — CBC WITH DIFFERENTIAL/PLATELET
Basophils Absolute: 0.1 10*3/uL (ref 0.0–0.1)
Basophils Relative: 1 % (ref 0.0–3.0)
Eosinophils Absolute: 0.3 10*3/uL (ref 0.0–0.7)
Eosinophils Relative: 3.8 % (ref 0.0–5.0)
HCT: 30.5 % — ABNORMAL LOW (ref 39.0–52.0)
Hemoglobin: 9.5 g/dL — ABNORMAL LOW (ref 13.0–17.0)
Lymphocytes Relative: 36.8 % (ref 12.0–46.0)
Lymphs Abs: 3.1 10*3/uL (ref 0.7–4.0)
MCHC: 31.1 g/dL (ref 30.0–36.0)
MCV: 77.7 fl — ABNORMAL LOW (ref 78.0–100.0)
Monocytes Absolute: 0.7 10*3/uL (ref 0.1–1.0)
Monocytes Relative: 8.7 % (ref 3.0–12.0)
Neutro Abs: 4.2 10*3/uL (ref 1.4–7.7)
Neutrophils Relative %: 49.7 % (ref 43.0–77.0)
Platelets: 391 10*3/uL (ref 150.0–400.0)
RBC: 3.93 Mil/uL — ABNORMAL LOW (ref 4.22–5.81)
RDW: 18.1 % — ABNORMAL HIGH (ref 11.5–15.5)
WBC: 8.4 10*3/uL (ref 4.0–10.5)

## 2024-06-27 LAB — COMPREHENSIVE METABOLIC PANEL WITH GFR
ALT: 13 U/L (ref 0–53)
AST: 17 U/L (ref 0–37)
Albumin: 4.1 g/dL (ref 3.5–5.2)
Alkaline Phosphatase: 72 U/L (ref 39–117)
BUN: 11 mg/dL (ref 6–23)
CO2: 32 meq/L (ref 19–32)
Calcium: 9.5 mg/dL (ref 8.4–10.5)
Chloride: 100 meq/L (ref 96–112)
Creatinine, Ser: 0.64 mg/dL (ref 0.40–1.50)
GFR: 111.84 mL/min (ref >60.00)
Glucose, Bld: 98 mg/dL (ref 70–99)
Potassium: 4 meq/L (ref 3.5–5.1)
Sodium: 139 meq/L (ref 135–145)
Total Bilirubin: 0.4 mg/dL (ref 0.2–1.2)
Total Protein: 7.1 g/dL (ref 6.0–8.3)
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
## 2024-06-27 NOTE — Telephone Encounter (Signed)
 Orders for Hospital bed, gel mattress , ostomy supplies and Nursing orders evaluate & treat faxed to [facility] at [phone].

## 2024-06-27 NOTE — Patient Instructions (Signed)
 It was great to see you!  Follow up in 1 month to check in and update your vaccines  Take care,  Diana Davenport PA-C

## 2024-06-27 NOTE — Telephone Encounter (Signed)
 Copied from CRM [id number]. Topic: Clinical - Order For Equipment >> Jun 27, 2024 10:24 AM Chasity T wrote: Reason for CRM: Joy from health department is calling in to request equipment orders for patient he is needing an hospital bed, gel mattress, and nursing orders w/ the current home health that he has. She states any questions you can call at [phone].

## 2024-06-28 ENCOUNTER — Ambulatory Visit: Payer: Self-pay | Admitting: Physician Assistant

## 2024-06-28 ENCOUNTER — Telehealth: Payer: Self-pay | Admitting: *Deleted

## 2024-06-28 NOTE — Telephone Encounter (Signed)
 Called [employer] and spoke to [facility] asked her how I can get colostomy supplies for pt? She said fax over pt's demographics, insurance info, office note and script with orders of supplies that he needs. Told her okay, everything faxed to [phone].

## 2024-06-28 NOTE — Telephone Encounter (Signed)
 Called Erin with [facility], left detailed message on her voicemail, verbal orders given for Physical Therapy Frequency: 1x a week 4 weeks, 1x a week every other week for 4 weeks. Also gave orders for speech therapy evaluation, new hospital bed , Gel mattress and [employer]. If you need anything else please call the office.

## 2024-06-28 NOTE — Telephone Encounter (Signed)
 Called [facility] and asked if patient was still under their care. She said he was discharged from there service in April. Told her okay, I faxed orders over yesterday please disregard. Receptionist verbalized understanding.
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
## 2024-06-28 NOTE — Telephone Encounter (Signed)
 Spoke to [employer], told her I contacted [employer] and sent orders over for pt's colostomy supplies. Phone number given to her. She asked how soon would he get supplies? Told her I am not sure you can call them later this afternoon and see. [name] verbalized understanding.

## 2024-06-28 NOTE — Telephone Encounter (Signed)
 Copied from CRM [id number]. Topic: General - Other >> Jun 28, 2024 10:29 AM Revonda D wrote: Reason for CRM: [name] the pt's caretaker is requesting to speak with Dr.Worley's nurse regarding the order for the colostomy bag. Candia would like a callback with an update at [phone].

## 2024-06-29 ENCOUNTER — Telehealth: Payer: Self-pay | Admitting: *Deleted

## 2024-06-29 NOTE — Telephone Encounter (Signed)
 Copied from CRM [id number]. Topic: General - Other >> Jun 29, 2024  9:47 AM Martinique E wrote: Reason for CRM: Harlene from [facility] called stating that they have sent over a fax 4 times for the patient's medical supplies and have not received a fax back. Fax number [phone] and phone number [phone].

## 2024-06-29 NOTE — Telephone Encounter (Signed)
 Forms completed and signed along with office notes faxed to [facility] at [phone].

## 2024-07-03 ENCOUNTER — Telehealth: Payer: Self-pay | Admitting: *Deleted

## 2024-07-03 DIAGNOSIS — G35 Multiple sclerosis: Secondary | ICD-10-CM | POA: Diagnosis not present

## 2024-07-03 DIAGNOSIS — K551 Chronic vascular disorders of intestine: Secondary | ICD-10-CM | POA: Diagnosis not present

## 2024-07-03 DIAGNOSIS — Z933 Colostomy status: Secondary | ICD-10-CM | POA: Diagnosis not present

## 2024-07-03 DIAGNOSIS — A419 Sepsis, unspecified organism: Secondary | ICD-10-CM | POA: Diagnosis not present

## 2024-07-03 DIAGNOSIS — Z9049 Acquired absence of other specified parts of digestive tract: Secondary | ICD-10-CM | POA: Diagnosis not present

## 2024-07-03 DIAGNOSIS — X16XXXD Contact with hot heating appliances, radiators and pipes, subsequent encounter: Secondary | ICD-10-CM | POA: Diagnosis not present

## 2024-07-03 DIAGNOSIS — T2102XD Burn of unspecified degree of abdominal wall, subsequent encounter: Secondary | ICD-10-CM | POA: Diagnosis not present

## 2024-07-03 DIAGNOSIS — Z556 Problems related to health literacy: Secondary | ICD-10-CM | POA: Diagnosis not present

## 2024-07-03 DIAGNOSIS — Z9081 Acquired absence of spleen: Secondary | ICD-10-CM | POA: Diagnosis not present

## 2024-07-03 DIAGNOSIS — Z604 Social exclusion and rejection: Secondary | ICD-10-CM | POA: Diagnosis not present

## 2024-07-03 NOTE — Telephone Encounter (Signed)
 Copied from CRM [id number]. Topic: General - Other >> Jul 03, 2024 11:24 AM Willma SAUNDERS wrote: Reason for CRM: Nathan Marsh is following up on orders supposed to be sent to  [facility] for patients colostomy supplies. States she spoke to them this morning and they stated they had not received anything in regards to the request.  Fax: [phone].  Nathan Marsh can be reached at [phone]

## 2024-07-03 NOTE — Telephone Encounter (Signed)
 Copied from CRM [id number]. Topic: General - Other >> Jul 03, 2024  4:01 PM Viola F wrote: Reason for CRM: Patient sister [name] requested call back regarding information on where patient hospital bed order was sent to and also would like the referral to skilled nursing home health to be put in. Please call her at [phone]

## 2024-07-03 NOTE — Telephone Encounter (Signed)
 Faxed orders over to [facility] again at [phone] and then called and spoke to Jess asked if they received the orders I faxed and Jess said it can take 3-4 days to see them in the system. Told her I faxed the orders on Friday, pt needs supplies now. Asked her if there is another fax number. Jess gave me [phone]. Told her okay.  Orders faxed again to [phone].  Called [name], told her I faxed orders over twice today to [facility] to 2 different fax numbers. Told her to call [facility] tomorrow morning and if you still have problems please let me know. [name] verbalized understanding.

## 2024-07-04 ENCOUNTER — Telehealth: Payer: Self-pay | Admitting: *Deleted

## 2024-07-04 ENCOUNTER — Telehealth: Payer: Self-pay | Admitting: Physician Assistant

## 2024-07-04 ENCOUNTER — Ambulatory Visit (HOSPITAL_COMMUNITY)
Admission: RE | Admit: 2024-07-04 | Discharge: 2024-07-04 | Disposition: A | Discharge status: Home / Self Care | Source: Ambulatory Visit | Attending: *Deleted | Admitting: *Deleted

## 2024-07-04 DIAGNOSIS — G35 Multiple sclerosis: Secondary | ICD-10-CM

## 2024-07-04 DIAGNOSIS — Z433 Encounter for attention to colostomy: Secondary | ICD-10-CM

## 2024-07-04 DIAGNOSIS — R32 Unspecified urinary incontinence: Secondary | ICD-10-CM

## 2024-07-04 DIAGNOSIS — Z933 Colostomy status: Secondary | ICD-10-CM | POA: Diagnosis not present

## 2024-07-04 DIAGNOSIS — G252 Other specified forms of tremor: Secondary | ICD-10-CM

## 2024-07-04 DIAGNOSIS — R262 Difficulty in walking, not elsewhere classified: Secondary | ICD-10-CM

## 2024-07-04 NOTE — Telephone Encounter (Signed)
 Spoke to pt told him you see Neurology for your infusion. Pt said yes, but would like someplace closer, too far. Told him okay I will get back to you tomorrow after I talk to [facility] to see where we can send you here in [place]. Pt verbalized understanding.

## 2024-07-04 NOTE — Telephone Encounter (Signed)
 [name] called back, told her I spoke to [place] and [facility] this morning, told them I sent orders over to [facility] for hospital bed, gel mattress and hoyer lift and should be contacting them. [name] verbalized understanding. Asked what she meant in her message about skilled nursing? She said when the home health physical therapist was there she asked why he did not have a nurse coming to check on him. Told her because he has [name] his caregiver with him. [name] verbalized understanding. [name] also asked about Colostomy supplies and that they went to the clinic this morning and some to tie them over. Told her I sent orders to [facility] and now I am waiting on a fax from them about orders. She said yes, his supplies changed today at the clinic that is why. Told her okay I soon as I get the fax I will send it back. [name] verbalized understanding.

## 2024-07-04 NOTE — Telephone Encounter (Unsigned)
 Copied from CRM [id number]. Topic: Referral - Request for Referral >> Jul 04, 2024 11:10 AM Robinson H wrote: Did the patient discuss referral with their provider in the last year? Yes (If No - schedule appointment) (If Yes - send message)  Appointment offered? No  Type of order/referral and detailed reason for visit: Tysabri  infusion treatments  Preference of office, provider, location: Somewhere closer in [place]  If referral order, have you been seen by this specialty before? Yes (If Yes, this issue or another issue? When? Where? [facility] in [place], [place]  Can we respond through MyChart? No

## 2024-07-04 NOTE — Telephone Encounter (Signed)
 Orders for Hospital bed, Gel mattress and Hoyer lift faxed to [facility] at [phone].

## 2024-07-04 NOTE — Telephone Encounter (Signed)
 Spoke to pt and care giver Nathan Marsh, told them orders were faxed to [facility] for equipment: Hospital bed, gel mattress and Hoyer lift, someone should be contacting you. [name] and pt verbalized understanding. Asked pt if he has been talking to his sister [name] she called home skilled Nursing Home Health? Pt said yes, he is talking to her. Told him okay I will call her back to see what she wants. I think Home Health is already coming out? [name] said yes for Physical Therapy. Told her okay.

## 2024-07-04 NOTE — Progress Notes (Signed)
 [facility]   Reason for visit:  LLQ colostomy one month ago.  Performed at an outside hospital HPI:  Ischemic colon with resection and end colostomy.   Multiple Sclerosis Past Medical History:  Diagnosis Date   H/O colectomy    H/O splenectomy    Multiple sclerosis (HCC)    Wheelchair dependent    Family History  Problem Relation Age of Onset   Cancer Mother        lung   No Known Allergies Current Outpatient Medications  Medication Sig Dispense Refill Last Dose/Taking   baclofen  (LIORESAL ) 10 MG tablet Take 10 mg by mouth 3 (three) times daily.      HYDROcodone -acetaminophen  (NORCO/VICODIN) 5-325 MG tablet Take 1 tablet by mouth every 6 (six) hours as needed.      Multiple Vitamins-Minerals (CENTRUM SILVER 50+MEN) TABS Take 1 tablet by mouth daily in the afternoon.      oxybutynin  (DITROPAN ) 5 MG tablet Take 1 tablet (5 mg total) by mouth 2 (two) times daily. 60 tablet 5    No current facility-administered medications for this encounter.   ROS  Review of Systems  Constitutional:  Positive for fatigue.  HENT:  Positive for drooling.   Gastrointestinal:        Recent colectomy for ischemic colon  Genitourinary:  Positive for difficulty urinating.       Recent urinary retention  Musculoskeletal:  Positive for gait problem.       MS with inability to stand or walk  Neurological:        MS- wheelchair bound  Psychiatric/Behavioral: Negative.    All other systems reviewed and are negative.  Vital signs:  BP 112/79 (BP Location: Right Arm)   Pulse 65   Temp 98.3 F (36.8 C) (Oral)   Resp 18   SpO2 99%  Exam:  Physical Exam Vitals reviewed.  Constitutional:      Appearance: Normal appearance.  HENT:     Mouth/Throat:     Mouth: Mucous membranes are moist.  Cardiovascular:     Rate and Rhythm: Normal rate.     Heart sounds: Normal heart sounds.  Abdominal:     Palpations: Abdomen is soft.  Skin:    General: Skin is warm and dry.     Coloration:  Skin is jaundiced.     Comments: Peristomal skin darkening  Neurological:     Mental Status: He is alert.     Sensory: Sensory deficit present.     Coordination: Coordination abnormal.     Gait: Gait abnormal.     Stoma type/location:  LLQ colosotomy Stomal assessment/size:  1 3/8 pink and moist budded Peristomal assessment:  intact Treatment options for stomal/peristomal skin: 1piece flat with barrier ring  Output: soft brown stool Ostomy pouching: 1pc flat.  Wearing a convatec flat pouch with a clip closure.  Discussed benefits of velcro closure, a filtered pouch to manage flatus and they would like to try.  Adding ostomy belt with Hollister pouch.  I place him in one today and provide samples.  Education provided:  Will enroll in [facility] for supplies.  Sister and partner watched pouch change and found the education to be beneficial .     Impression/dx  Colostomy  Discussion  Enroll for supplies Update demographic info, sisters address Plan  See back as needed.     Visit time: 55 minutes.   Darice Cooley FNP-BC

## 2024-07-04 NOTE — Telephone Encounter (Signed)
 Called Erin with [facility], told her calling to see if she got my orders I left on her voicemail? Rocky said yes for Physical Therapy and Speech Therapy. She said we do not do equipment that needs to go somewhere else. Told her okay, thank you.

## 2024-07-04 NOTE — Telephone Encounter (Unsigned)
 Copied from CRM [id number]. Topic: General - Other >> Jul 04, 2024 10:00 AM Aisha D wrote: Reason for CRM: Harley with [facility] medical supplies stated that she will be faxing over a form for medical supplies for the pt.

## 2024-07-04 NOTE — Telephone Encounter (Signed)
 Left message on voicemail to call office.

## 2024-07-05 ENCOUNTER — Telehealth: Payer: Self-pay | Admitting: *Deleted

## 2024-07-05 ENCOUNTER — Other Ambulatory Visit (HOSPITAL_COMMUNITY): Payer: Self-pay | Admitting: Nurse Practitioner

## 2024-07-05 DIAGNOSIS — Z604 Social exclusion and rejection: Secondary | ICD-10-CM | POA: Diagnosis not present

## 2024-07-05 DIAGNOSIS — L24B3 Irritant contact dermatitis related to fecal or urinary stoma or fistula: Secondary | ICD-10-CM | POA: Insufficient documentation

## 2024-07-05 DIAGNOSIS — G35 Multiple sclerosis: Secondary | ICD-10-CM | POA: Diagnosis not present

## 2024-07-05 DIAGNOSIS — Z556 Problems related to health literacy: Secondary | ICD-10-CM | POA: Diagnosis not present

## 2024-07-05 DIAGNOSIS — A419 Sepsis, unspecified organism: Secondary | ICD-10-CM | POA: Diagnosis not present

## 2024-07-05 DIAGNOSIS — Z933 Colostomy status: Secondary | ICD-10-CM | POA: Diagnosis not present

## 2024-07-05 DIAGNOSIS — Z7189 Other specified counseling: Secondary | ICD-10-CM | POA: Insufficient documentation

## 2024-07-05 DIAGNOSIS — Z9049 Acquired absence of other specified parts of digestive tract: Secondary | ICD-10-CM | POA: Diagnosis not present

## 2024-07-05 DIAGNOSIS — T2102XD Burn of unspecified degree of abdominal wall, subsequent encounter: Secondary | ICD-10-CM | POA: Diagnosis not present

## 2024-07-05 DIAGNOSIS — Z433 Encounter for attention to colostomy: Secondary | ICD-10-CM | POA: Insufficient documentation

## 2024-07-05 DIAGNOSIS — Z9081 Acquired absence of spleen: Secondary | ICD-10-CM | POA: Diagnosis not present

## 2024-07-05 DIAGNOSIS — X16XXXD Contact with hot heating appliances, radiators and pipes, subsequent encounter: Secondary | ICD-10-CM | POA: Diagnosis not present

## 2024-07-05 DIAGNOSIS — K551 Chronic vascular disorders of intestine: Secondary | ICD-10-CM | POA: Diagnosis not present

## 2024-07-05 NOTE — Discharge Instructions (Signed)
 Will update [facility].  Hollister pouches Ostomy belt.

## 2024-07-05 NOTE — Telephone Encounter (Signed)
 Spoke to pt told him I have placed a referral for [facility] here in [place] for him, someone will contact you to schedule an appointment. Pt verbalized understanding.

## 2024-07-05 NOTE — Telephone Encounter (Signed)
 Nathan Marsh, please advise where to send pt here in [place] to have infusions done?

## 2024-07-05 NOTE — Telephone Encounter (Unsigned)
 Copied from CRM [id number]. Topic: General - Call Back - No Documentation >> Jul 05, 2024  2:48 PM Rea C wrote: Reason for CRM: A representative from [employer] [phone] called on behalf of patient and stated that they faxed over forms last Thursday to be filled out by Lucie Buttner for a powerchair for patient. Rep stated he will fax over the documents again today to be signed.

## 2024-07-06 NOTE — Telephone Encounter (Signed)
 Forms were signed and faxed back to [employer].

## 2024-07-06 NOTE — Telephone Encounter (Signed)
 Form signed and faxed to [facility] at [phone].

## 2024-07-10 DIAGNOSIS — Z933 Colostomy status: Secondary | ICD-10-CM | POA: Diagnosis not present

## 2024-07-10 DIAGNOSIS — R2243 Localized swelling, mass and lump, lower limb, bilateral: Secondary | ICD-10-CM | POA: Diagnosis not present

## 2024-07-10 DIAGNOSIS — Z9081 Acquired absence of spleen: Secondary | ICD-10-CM | POA: Diagnosis not present

## 2024-07-10 DIAGNOSIS — Z556 Problems related to health literacy: Secondary | ICD-10-CM | POA: Diagnosis not present

## 2024-07-10 DIAGNOSIS — R634 Abnormal weight loss: Secondary | ICD-10-CM | POA: Diagnosis not present

## 2024-07-10 DIAGNOSIS — Z9049 Acquired absence of other specified parts of digestive tract: Secondary | ICD-10-CM | POA: Diagnosis not present

## 2024-07-10 DIAGNOSIS — K551 Chronic vascular disorders of intestine: Secondary | ICD-10-CM | POA: Diagnosis not present

## 2024-07-10 DIAGNOSIS — Z604 Social exclusion and rejection: Secondary | ICD-10-CM | POA: Diagnosis not present

## 2024-07-10 DIAGNOSIS — T2102XD Burn of unspecified degree of abdominal wall, subsequent encounter: Secondary | ICD-10-CM | POA: Diagnosis not present

## 2024-07-10 DIAGNOSIS — G35 Multiple sclerosis: Secondary | ICD-10-CM | POA: Diagnosis not present

## 2024-07-10 DIAGNOSIS — A419 Sepsis, unspecified organism: Secondary | ICD-10-CM | POA: Diagnosis not present

## 2024-07-10 DIAGNOSIS — X16XXXD Contact with hot heating appliances, radiators and pipes, subsequent encounter: Secondary | ICD-10-CM | POA: Diagnosis not present

## 2024-07-11 DIAGNOSIS — Z933 Colostomy status: Secondary | ICD-10-CM | POA: Diagnosis not present

## 2024-07-13 DIAGNOSIS — Z556 Problems related to health literacy: Secondary | ICD-10-CM | POA: Diagnosis not present

## 2024-07-13 DIAGNOSIS — Z933 Colostomy status: Secondary | ICD-10-CM | POA: Diagnosis not present

## 2024-07-13 DIAGNOSIS — Z604 Social exclusion and rejection: Secondary | ICD-10-CM | POA: Diagnosis not present

## 2024-07-13 DIAGNOSIS — K551 Chronic vascular disorders of intestine: Secondary | ICD-10-CM | POA: Diagnosis not present

## 2024-07-13 DIAGNOSIS — Z9081 Acquired absence of spleen: Secondary | ICD-10-CM | POA: Diagnosis not present

## 2024-07-13 DIAGNOSIS — G35 Multiple sclerosis: Secondary | ICD-10-CM | POA: Diagnosis not present

## 2024-07-13 DIAGNOSIS — Z9049 Acquired absence of other specified parts of digestive tract: Secondary | ICD-10-CM | POA: Diagnosis not present

## 2024-07-13 DIAGNOSIS — A419 Sepsis, unspecified organism: Secondary | ICD-10-CM | POA: Diagnosis not present

## 2024-07-13 DIAGNOSIS — T2102XD Burn of unspecified degree of abdominal wall, subsequent encounter: Secondary | ICD-10-CM | POA: Diagnosis not present

## 2024-07-13 DIAGNOSIS — X16XXXD Contact with hot heating appliances, radiators and pipes, subsequent encounter: Secondary | ICD-10-CM | POA: Diagnosis not present

## 2024-07-18 DIAGNOSIS — Z9049 Acquired absence of other specified parts of digestive tract: Secondary | ICD-10-CM | POA: Diagnosis not present

## 2024-07-18 DIAGNOSIS — Z604 Social exclusion and rejection: Secondary | ICD-10-CM | POA: Diagnosis not present

## 2024-07-18 DIAGNOSIS — Z933 Colostomy status: Secondary | ICD-10-CM | POA: Diagnosis not present

## 2024-07-18 DIAGNOSIS — Z556 Problems related to health literacy: Secondary | ICD-10-CM | POA: Diagnosis not present

## 2024-07-18 DIAGNOSIS — Z9081 Acquired absence of spleen: Secondary | ICD-10-CM | POA: Diagnosis not present

## 2024-07-18 DIAGNOSIS — G35 Multiple sclerosis: Secondary | ICD-10-CM | POA: Diagnosis not present

## 2024-07-18 DIAGNOSIS — X16XXXD Contact with hot heating appliances, radiators and pipes, subsequent encounter: Secondary | ICD-10-CM | POA: Diagnosis not present

## 2024-07-18 DIAGNOSIS — T2102XD Burn of unspecified degree of abdominal wall, subsequent encounter: Secondary | ICD-10-CM | POA: Diagnosis not present

## 2024-07-18 DIAGNOSIS — K551 Chronic vascular disorders of intestine: Secondary | ICD-10-CM | POA: Diagnosis not present

## 2024-07-18 DIAGNOSIS — A419 Sepsis, unspecified organism: Secondary | ICD-10-CM | POA: Diagnosis not present

## 2024-07-19 DIAGNOSIS — Z556 Problems related to health literacy: Secondary | ICD-10-CM | POA: Diagnosis not present

## 2024-07-19 DIAGNOSIS — R2243 Localized swelling, mass and lump, lower limb, bilateral: Secondary | ICD-10-CM | POA: Diagnosis not present

## 2024-07-19 DIAGNOSIS — Z933 Colostomy status: Secondary | ICD-10-CM | POA: Diagnosis not present

## 2024-07-19 DIAGNOSIS — A419 Sepsis, unspecified organism: Secondary | ICD-10-CM | POA: Diagnosis not present

## 2024-07-19 DIAGNOSIS — Z604 Social exclusion and rejection: Secondary | ICD-10-CM | POA: Diagnosis not present

## 2024-07-19 DIAGNOSIS — Z9081 Acquired absence of spleen: Secondary | ICD-10-CM | POA: Diagnosis not present

## 2024-07-19 DIAGNOSIS — X16XXXD Contact with hot heating appliances, radiators and pipes, subsequent encounter: Secondary | ICD-10-CM | POA: Diagnosis not present

## 2024-07-19 DIAGNOSIS — K551 Chronic vascular disorders of intestine: Secondary | ICD-10-CM | POA: Diagnosis not present

## 2024-07-19 DIAGNOSIS — Z9049 Acquired absence of other specified parts of digestive tract: Secondary | ICD-10-CM | POA: Diagnosis not present

## 2024-07-19 DIAGNOSIS — T2102XD Burn of unspecified degree of abdominal wall, subsequent encounter: Secondary | ICD-10-CM | POA: Diagnosis not present

## 2024-07-19 DIAGNOSIS — R634 Abnormal weight loss: Secondary | ICD-10-CM | POA: Diagnosis not present

## 2024-07-19 DIAGNOSIS — G35 Multiple sclerosis: Secondary | ICD-10-CM | POA: Diagnosis not present

## 2024-07-20 ENCOUNTER — Telehealth: Payer: Self-pay | Admitting: *Deleted

## 2024-07-20 NOTE — Telephone Encounter (Signed)
 Copied from CRM [id number]. Topic: Clinical - Request for Lab/Test Order >> Jul 20, 2024  3:07 PM Mercedes MATSU wrote: Reason for CRM: Patients sister Chantell Larry is her old last name) Leonore called on her brothers behalf stating that she was told by her brothers nurse that he is eligible for palliative care. He currently has PT services after being in a nursing facility in a month. They are saying he could really use palliative care and wants to know if Dr. Job would write an order for it. Patients sister can be reached at [phone] chantell Beyers.

## 2024-07-21 ENCOUNTER — Telehealth: Payer: Self-pay | Admitting: Physician Assistant

## 2024-07-21 NOTE — Telephone Encounter (Signed)
 Document Home Health Certificate (Order ID [id number]), to be filled out by provider. Patient requested to send it back via Fax within 5-days. Document is located in providers tray at front office.Please advise

## 2024-07-25 DIAGNOSIS — K559 Vascular disorder of intestine, unspecified: Secondary | ICD-10-CM | POA: Diagnosis not present

## 2024-07-25 NOTE — Telephone Encounter (Signed)
 Spoke to pts' sister Chantell, told her calling about message wanting Palliative Care. Asked her if he has gone down hill since we saw him? Chantell said that he was in the hospital, has surgery and was in facility for awhile, is home but still is not able to take care of himself. She said the OT said that he should qualify for Palliative care. Told her pt is scheduled for an appt on Friday 8/1 at 11:00 AM with [facility] and we can discuss it then. Asked her if she had a facility she wanted to use? Chantell said she has one in mind and will let us  know. Also she said pt needs a recliner chair. Told her will discuss at appt and see about orders. Chantell verbalized understanding.

## 2024-07-26 ENCOUNTER — Encounter: Payer: Self-pay | Admitting: Neurology

## 2024-07-27 DIAGNOSIS — Z556 Problems related to health literacy: Secondary | ICD-10-CM | POA: Diagnosis not present

## 2024-07-27 DIAGNOSIS — R32 Unspecified urinary incontinence: Secondary | ICD-10-CM

## 2024-07-27 DIAGNOSIS — Z933 Colostomy status: Secondary | ICD-10-CM | POA: Diagnosis not present

## 2024-07-27 DIAGNOSIS — Z9049 Acquired absence of other specified parts of digestive tract: Secondary | ICD-10-CM | POA: Diagnosis not present

## 2024-07-27 DIAGNOSIS — T2102XD Burn of unspecified degree of abdominal wall, subsequent encounter: Secondary | ICD-10-CM | POA: Diagnosis not present

## 2024-07-27 DIAGNOSIS — A419 Sepsis, unspecified organism: Secondary | ICD-10-CM | POA: Diagnosis not present

## 2024-07-27 DIAGNOSIS — J189 Pneumonia, unspecified organism: Secondary | ICD-10-CM

## 2024-07-27 DIAGNOSIS — K551 Chronic vascular disorders of intestine: Secondary | ICD-10-CM | POA: Diagnosis not present

## 2024-07-27 DIAGNOSIS — G35 Multiple sclerosis: Secondary | ICD-10-CM | POA: Diagnosis not present

## 2024-07-27 DIAGNOSIS — X16XXXD Contact with hot heating appliances, radiators and pipes, subsequent encounter: Secondary | ICD-10-CM | POA: Diagnosis not present

## 2024-07-27 DIAGNOSIS — Z604 Social exclusion and rejection: Secondary | ICD-10-CM | POA: Diagnosis not present

## 2024-07-27 DIAGNOSIS — Z9081 Acquired absence of spleen: Secondary | ICD-10-CM | POA: Diagnosis not present

## 2024-07-27 NOTE — Telephone Encounter (Signed)
 Home Health plan of care signed and faxed.

## 2024-07-27 NOTE — Telephone Encounter (Signed)
 Called [name]-Scheduled patient for 08/01/24 @ 1300.  Advised mother of the new location and check in process.  [name] voiced her understanding.

## 2024-07-28 ENCOUNTER — Ambulatory Visit: Admitting: Physician Assistant

## 2024-07-31 DIAGNOSIS — Z933 Colostomy status: Secondary | ICD-10-CM | POA: Diagnosis not present

## 2024-07-31 DIAGNOSIS — X16XXXD Contact with hot heating appliances, radiators and pipes, subsequent encounter: Secondary | ICD-10-CM | POA: Diagnosis not present

## 2024-07-31 DIAGNOSIS — T2102XD Burn of unspecified degree of abdominal wall, subsequent encounter: Secondary | ICD-10-CM | POA: Diagnosis not present

## 2024-07-31 DIAGNOSIS — A419 Sepsis, unspecified organism: Secondary | ICD-10-CM | POA: Diagnosis not present

## 2024-07-31 DIAGNOSIS — G35 Multiple sclerosis: Secondary | ICD-10-CM | POA: Diagnosis not present

## 2024-07-31 DIAGNOSIS — Z9049 Acquired absence of other specified parts of digestive tract: Secondary | ICD-10-CM | POA: Diagnosis not present

## 2024-07-31 DIAGNOSIS — K551 Chronic vascular disorders of intestine: Secondary | ICD-10-CM | POA: Diagnosis not present

## 2024-07-31 DIAGNOSIS — Z556 Problems related to health literacy: Secondary | ICD-10-CM | POA: Diagnosis not present

## 2024-07-31 DIAGNOSIS — Z9081 Acquired absence of spleen: Secondary | ICD-10-CM | POA: Diagnosis not present

## 2024-07-31 DIAGNOSIS — Z604 Social exclusion and rejection: Secondary | ICD-10-CM | POA: Diagnosis not present

## 2024-08-01 DIAGNOSIS — G35 Multiple sclerosis: Secondary | ICD-10-CM | POA: Diagnosis not present

## 2024-08-02 DIAGNOSIS — Z9081 Acquired absence of spleen: Secondary | ICD-10-CM | POA: Diagnosis not present

## 2024-08-02 DIAGNOSIS — T2102XD Burn of unspecified degree of abdominal wall, subsequent encounter: Secondary | ICD-10-CM | POA: Diagnosis not present

## 2024-08-02 DIAGNOSIS — R531 Weakness: Secondary | ICD-10-CM | POA: Diagnosis not present

## 2024-08-02 DIAGNOSIS — K551 Chronic vascular disorders of intestine: Secondary | ICD-10-CM | POA: Diagnosis not present

## 2024-08-02 DIAGNOSIS — Z9049 Acquired absence of other specified parts of digestive tract: Secondary | ICD-10-CM | POA: Diagnosis not present

## 2024-08-02 DIAGNOSIS — Z933 Colostomy status: Secondary | ICD-10-CM | POA: Diagnosis not present

## 2024-08-02 DIAGNOSIS — X16XXXD Contact with hot heating appliances, radiators and pipes, subsequent encounter: Secondary | ICD-10-CM | POA: Diagnosis not present

## 2024-08-02 DIAGNOSIS — Z556 Problems related to health literacy: Secondary | ICD-10-CM | POA: Diagnosis not present

## 2024-08-02 DIAGNOSIS — Z604 Social exclusion and rejection: Secondary | ICD-10-CM | POA: Diagnosis not present

## 2024-08-02 DIAGNOSIS — G35 Multiple sclerosis: Secondary | ICD-10-CM | POA: Diagnosis not present

## 2024-08-02 DIAGNOSIS — A419 Sepsis, unspecified organism: Secondary | ICD-10-CM | POA: Diagnosis not present

## 2024-08-03 ENCOUNTER — Ambulatory Visit: Admitting: Physician Assistant

## 2024-08-04 ENCOUNTER — Telehealth: Payer: Self-pay

## 2024-08-04 NOTE — Telephone Encounter (Signed)
 Copied from CRM [id number]. Topic: General - Other >> Jul 27, 2024  4:02 PM Zebedee SAUNDERS wrote: Reason for CRM: Pt's sister Gillian Notice [phone] calling regarding palliative care for pt at [facility] ph: [phone]. Please call pt to confirm. >> Aug 04, 2024 12:03 PM Armenia J wrote: Patient's sister Terrace Notice) is wondering if there was an update on the patient's need for palliative care through [facility] ph: [phone]. She stated that she has not received an update since 2 weeks ago.  She is aware of the next business day turn around time and will be expecting a phone call.  [name] [phone]  Please see msg regarding Palliative care for patient; this was to be discussed at appt on 07/28/24 however patient was a no show.

## 2024-08-07 NOTE — Progress Notes (Deleted)
 NEUROLOGY CONSULTATION NOTE  Nathan Marsh MRN: [id number] DOB: 03-21-1976  Referring provider: Lucie Buttner, PA Primary care provider: Lucie Buttner, PA  Reason for consult:  Multiple sclerosis  Assessment/Plan:   ***   Subjective:  Nathan Marsh is a 48 year old ***-handed male who presents to establish care for multiple sclerosis.  History supplemented by prior neurologist's notes.  MRIs from 2007 personally reviewed.  Diagnosed with MS ***.  Vision:  *** Motor:  *** Sensory:  *** Tremor:  Cerebellar tremor involving both arms.  Seen by Dr. Rosalia in the movement disorder clinic at [facility] who suggested DBS to potentially help tremor in left hand.  *** Pain:  *** Gait:  Wheelchair.  Able to transfer independently Bowel/Bladder:  Neurogenic bladder with increased frequency.  Takes oxybutynin  which helps.   Fatigue:  *** Cognition:  *** Mood:  ***  Current DMT: Tysabri  (since ***) Past DMT:  ***  Current medications:  baclofen  10mg  TID, oxybutynin  5mg  BID Past medications:  ***   Imaging: 08/21/2022 MRI BRAIN W WO:  No significant interval change relative to MRI dated 10/04/2019 with similar extensive abnormal signal compatible with sequela of demyelination. No definite new lesion or evidence of active demyelination.  10/04/2019 MRI BRAIN W WO:  Similar appearance of extensive white matter disease, which is compatible with the reported diagnosis of multiple sclerosis. No new or enhancing lesions identified (reportedly similar to MRIs going back to 2011).  08/12/2011 MRI BRAIN W WO:  Similar size and distribution multiple hyperintense  T2/FLAIR signal lesions involving the cerebral white matter, pons,  cerebellum, medulla. No evidence of restricted diffusion or associated enhancement to suggest active demyelination. No definite new lesions identified.  08/12/2011 MRI C-SPINE W WO:  Multiple T2 hyperintense lesions are again  demonstrated  throughout the cervical spinal cord, similar to previous MRI evaluation in February 2011. No definite new lesions identified  11/19/2006 MRI BRAIN W WO:  Extensive changes in the cerebral and cerebellar white matter as well as in the left thalamus and proximal spinal cord. Some of these lesions are mildly positive on diffusion and two lesions in the frontal white matter show mild enhancement. The findings are most compatible with multiple sclerosis. I would suggest MRI of the remainder of the spinal cord as there is at least one lesion in the proximal spinal cord identified on this study.  11/26/2006 MRI C-SPINE W WO:  Several regions of altered signal intensity and very subtle enhancement throughout the cervical cord suggest a demyelinating process with other considerations as described above.  11/26/2006 MRI T-SPINE W WO:  1.  Focal area of altered signal intensity within the right aspect of the thoracic cord at the T10-11 level without enhancement.  This is felt to represent the same process as that involving the cervical cord and intracranial structures as discussed above.   2. Nonspecific bony lesions within the T2 vertebral body (measuring up to 1.3 cm) and the T10 vertebral body (measuring up to 0.7 cm).   Question atypical hemangiomas?   Labs: 08/01/2024 CBC with WBC 7, HGB 12.6, HCT 40.3, PLT 245, ALC 2.50; hepatic panel with t bili 0.4, ALP 56, AST 24, ALT 10; CD3 1869, CD4 952, CD8 880 05/17/2024 JCV Ab negative with index 0.17; Helper T-lymphocyte CD4 865  PAST MEDICAL HISTORY: Past Medical History:  Diagnosis Date   H/O colectomy    H/O splenectomy    Multiple sclerosis (HCC)    Wheelchair dependent  PAST SURGICAL HISTORY: Past Surgical History:  Procedure Laterality Date   AMPUTATION Left 04/27/2017   Procedure: AMPUTATION DIGIT REVISION LEFT MIDDLE FINGER;  Surgeon: Balinda Rogue, MD;  Location: [facility] OR;  Service: Orthopedics;  Laterality: Left;   COLOSTOMY  05/24/2024   SKIN  GRAFT SPLIT THICKNESS TRUNK  05/24/2024   SPLENECTOMY  05/24/2024   SUBTOTAL COLECTOMY Left 05/24/2024    MEDICATIONS: Current Outpatient Medications on File Prior to Visit  Medication Sig Dispense Refill   baclofen  (LIORESAL ) 10 MG tablet Take 10 mg by mouth 3 (three) times daily.     HYDROcodone -acetaminophen  (NORCO/VICODIN) 5-325 MG tablet Take 1 tablet by mouth every 6 (six) hours as needed.     Multiple Vitamins-Minerals (CENTRUM SILVER 50+MEN) TABS Take 1 tablet by mouth daily in the afternoon.     oxybutynin  (DITROPAN ) 5 MG tablet Take 1 tablet (5 mg total) by mouth 2 (two) times daily. 60 tablet 5   No current facility-administered medications on file prior to visit.    ALLERGIES: No Known Allergies  FAMILY HISTORY: Family History  Problem Relation Age of Onset   Cancer Mother        lung    Objective:  *** General: No acute distress.  Patient appears well-groomed.   Head:  Normocephalic/atraumatic Eyes:  fundi examined but not visualized Neck: supple, no paraspinal tenderness, full range of motion Heart: regular rate and rhythm Neurological Exam: Mental status: alert and oriented to person, place, and time, speech fluent and not dysarthric, language intact. Cranial nerves: CN I: not tested CN II: pupils equal, round and reactive to light, visual fields intact CN III, IV, VI:  full range of motion, no nystagmus, no ptosis CN V: facial sensation intact. CN VII: upper and lower face symmetric CN VIII: hearing intact CN IX, X: gag intact, uvula midline CN XI: sternocleidomastoid and trapezius muscles intact CN XII: tongue midline Bulk & Tone: normal, no fasciculations. Motor:  muscle strength 5/5 throughout Sensation:  Pinprick and vibratory sensation intact. Deep Tendon Reflexes:  2+ throughout,  toes downgoing.   Finger to nose testing:  Without dysmetria.   Gait:  Normal station and stride.  Romberg negative.    Thank you for allowing me to take part in  the care of this patient.  Juliene Dunnings, DO  CC: ***

## 2024-08-07 NOTE — Telephone Encounter (Signed)
 Please contact patient directly to schedule an appt; FYI caller Gillian Notice not listed on pt DPR to discuss treatment with her.

## 2024-08-08 ENCOUNTER — Ambulatory Visit: Admitting: Neurology

## 2024-08-09 ENCOUNTER — Ambulatory Visit (INDEPENDENT_AMBULATORY_CARE_PROVIDER_SITE_OTHER): Admitting: Physician Assistant

## 2024-08-09 ENCOUNTER — Telehealth: Payer: Self-pay | Admitting: Physician Assistant

## 2024-08-09 ENCOUNTER — Encounter: Payer: Self-pay | Admitting: Physician Assistant

## 2024-08-09 VITALS — BP 130/68 | HR 74 | Temp 98.2°F | Ht 66.0 in | Wt 118.0 lb

## 2024-08-09 DIAGNOSIS — Z9081 Acquired absence of spleen: Secondary | ICD-10-CM | POA: Diagnosis not present

## 2024-08-09 DIAGNOSIS — Z23 Encounter for immunization: Secondary | ICD-10-CM | POA: Diagnosis not present

## 2024-08-09 DIAGNOSIS — R262 Difficulty in walking, not elsewhere classified: Secondary | ICD-10-CM

## 2024-08-09 DIAGNOSIS — G35 Multiple sclerosis: Secondary | ICD-10-CM

## 2024-08-09 DIAGNOSIS — Z09 Encounter for follow-up examination after completed treatment for conditions other than malignant neoplasm: Secondary | ICD-10-CM

## 2024-08-09 NOTE — Patient Instructions (Signed)
 It was great to see you!  We have updated your vaccines today  Please follow up in 3 months, sooner if concerns  I will have my staff reach out to Joi to see if there are additional resources that we may be able to get to you without change in care.  Take care,  Lucie Buttner PA-C

## 2024-08-09 NOTE — Telephone Encounter (Signed)
 Please call patients case worker Nathan Marsh at [phone].   Per patient's caregiver Nathan Marsh (on [place]) and sister Nathan Marsh (not on [place]) there has been discussions that if Nathan Marsh was considered palliative care that he would have more services available to him. They state that this information is coming from [facility]. Please ask Nathan Marsh about this. Patient does not have any interest in de-escalating his care and has concerns that this may not be in his best interest, however I did discuss with him that I would inquire on his behalf to see what additional resources this may provide.  [name]

## 2024-08-09 NOTE — Progress Notes (Signed)
 Nathan Marsh is a 48 y.o. male here for a follow up of a pre-existing problem.  History of Present Illness:   Chief Complaint  Patient presents with   Follow-up    34mo; no concerns; feeling great today    [place] He is due for updated vaccines since his initial surgery He is ok with vaccine administration today Feeling well overall  Multiple sclerosis Patient initially wanted us  to refer him to a local neurologist however he has decided to continue his Tysabri  infusions with his [facility] provider Had infusion on 08/01/24 Overall doing well  Social determinants of health Patients sister has tried to get involved with his care and he is not happy with this He reports that he is not confident that his sister and father have his best interests in mind Per patient's caregiver Nathan Marsh (on [place]) and sister Nathan Marsh (not on [place]) there has been discussions that if Nathan Marsh was considered palliative care that he would have more services available to him. He is not interested in de-escalation of care.   Past Medical History:  Diagnosis Date   H/O colectomy    H/O splenectomy    Multiple sclerosis (HCC)    Wheelchair dependent      Social History   Tobacco Use   Smoking status: Every Day    Current packs/day: 0.50    Average packs/day: 0.5 packs/day for 24.2 years (12.1 ttl pk-yrs)    Types: Cigarettes    Start date: 05/31/2000   Smokeless tobacco: Never  Vaping Use   Vaping status: Never Used  Substance Use Topics   Alcohol use: Yes    Comment: very limited   Drug use: Yes    Types: Marijuana    Past Surgical History:  Procedure Laterality Date   AMPUTATION Left 04/27/2017   Procedure: AMPUTATION DIGIT REVISION LEFT MIDDLE FINGER;  Surgeon: Balinda Rogue, MD;  Location: [facility] OR;  Service: Orthopedics;  Laterality: Left;   COLOSTOMY  05/24/2024   SKIN GRAFT SPLIT THICKNESS TRUNK  05/24/2024   SPLENECTOMY  05/24/2024   SUBTOTAL COLECTOMY Left 05/24/2024    Family History   Problem Relation Age of Onset   Cancer Mother        lung    No Known Allergies  Current Medications:   Current Outpatient Medications:    baclofen  (LIORESAL ) 10 MG tablet, Take 10 mg by mouth 3 (three) times daily., Disp: , Rfl:    HYDROcodone -acetaminophen  (NORCO/VICODIN) 5-325 MG tablet, Take 1 tablet by mouth every 6 (six) hours as needed., Disp: , Rfl:    Multiple Vitamins-Minerals (CENTRUM SILVER 50+MEN) TABS, Take 1 tablet by mouth daily in the afternoon., Disp: , Rfl:    oxybutynin  (DITROPAN ) 5 MG tablet, Take 1 tablet (5 mg total) by mouth 2 (two) times daily., Disp: 60 tablet, Rfl: 5   Review of Systems:   Negative unless otherwise specified per HPI.  Vitals:   Vitals:   08/09/24 1057  BP: 130/68  Pulse: 74  Temp: 98.2 F (36.8 C)  SpO2: 97%  Weight: 118 lb (53.5 kg)  Height: 5' 6 (1.676 m)     Body mass index is 19.05 kg/m.  Physical Exam:   Physical Exam Vitals and nursing note reviewed.  Constitutional:      Appearance: He is well-developed.  HENT:     Head: Normocephalic.  Eyes:     Conjunctiva/sclera: Conjunctivae normal.     Pupils: Pupils are equal, round, and reactive to light.  Pulmonary:  Effort: Pulmonary effort is normal.  Musculoskeletal:        General: Normal range of motion.     Cervical back: Normal range of motion.  Skin:    General: Skin is warm and dry.  Neurological:     Mental Status: He is alert and oriented to person, place, and time.  Psychiatric:        Behavior: Behavior normal.        Thought Content: Thought content normal.        Judgment: Judgment normal.     Assessment and Plan:   Multiple sclerosis (HCC) Continues with regular Tysabri  infusions Would like to continue physical therapy but does not have any visits left -- he will let us  know if he is able to resume financially Follow up in 3 month(s)   Need for immunization follow-up; H/O splenectomy Vaccines updated today  I have also sent a message  for someone from our staff to reach out to his case worker to inquire about recommendations for palliative care to provide additional resources.  Patient is NOT interested in any de-escalation of care at this time.  Lucie Buttner, PA-C

## 2024-08-10 DIAGNOSIS — Z933 Colostomy status: Secondary | ICD-10-CM | POA: Diagnosis not present

## 2024-08-10 NOTE — Telephone Encounter (Signed)
 Patient's caregiver called stating they received a call from our office on 08/10/24. There are no notes a call was made. Possible call was made by mistake. Please refer to previous msg to call Case Worker not Care Giver.

## 2024-08-11 DIAGNOSIS — Z933 Colostomy status: Secondary | ICD-10-CM | POA: Diagnosis not present

## 2024-08-14 DIAGNOSIS — X16XXXD Contact with hot heating appliances, radiators and pipes, subsequent encounter: Secondary | ICD-10-CM | POA: Diagnosis not present

## 2024-08-14 DIAGNOSIS — T2102XD Burn of unspecified degree of abdominal wall, subsequent encounter: Secondary | ICD-10-CM | POA: Diagnosis not present

## 2024-08-14 DIAGNOSIS — Z933 Colostomy status: Secondary | ICD-10-CM | POA: Diagnosis not present

## 2024-08-14 DIAGNOSIS — A419 Sepsis, unspecified organism: Secondary | ICD-10-CM | POA: Diagnosis not present

## 2024-08-14 DIAGNOSIS — Z9081 Acquired absence of spleen: Secondary | ICD-10-CM | POA: Diagnosis not present

## 2024-08-14 DIAGNOSIS — Z604 Social exclusion and rejection: Secondary | ICD-10-CM | POA: Diagnosis not present

## 2024-08-14 DIAGNOSIS — Z556 Problems related to health literacy: Secondary | ICD-10-CM | POA: Diagnosis not present

## 2024-08-14 DIAGNOSIS — K551 Chronic vascular disorders of intestine: Secondary | ICD-10-CM | POA: Diagnosis not present

## 2024-08-14 DIAGNOSIS — G35 Multiple sclerosis: Secondary | ICD-10-CM | POA: Diagnosis not present

## 2024-08-14 DIAGNOSIS — Z9049 Acquired absence of other specified parts of digestive tract: Secondary | ICD-10-CM | POA: Diagnosis not present

## 2024-08-14 NOTE — Telephone Encounter (Signed)
 Called [facility] at [phone], left VM to call the office to discuss.

## 2024-08-15 ENCOUNTER — Telehealth: Payer: Self-pay | Admitting: *Deleted

## 2024-08-15 NOTE — Telephone Encounter (Signed)
 Copied from CRM [id number]. Topic: General - Other >> Aug 15, 2024  8:12 AM Franky GRADE wrote: Reason for CRM: Lorie physical therapist with [facility] is calling to advise patient has completed the max sessions of PT.

## 2024-08-15 NOTE — Telephone Encounter (Signed)
 FYI, see message.

## 2024-08-16 NOTE — Telephone Encounter (Signed)
 Called and spoke with [name] (on DPR) and discussed the differences between Hospice and Palliative Care. Discussed the benefits of palliative care and advised that just because we sent the referral, its not a hard commitment to proceed with the service, but that someone would reach out and would be able to answer any other questions about the services offered.   [name] verbalized understanding, she will discuss this with Lynwood and call the office back tomorrow regarding referral.

## 2024-08-16 NOTE — Telephone Encounter (Addendum)
 Called and spoke with Jodi, the feels that palliative care would be a good option for patient as his aid is not able to be with him 24/7, he is no longer able to transfer himself, and she is concerned that PT/OT may discharge him at some point for reaching MMI.   Palliative care would be another resource for patient and family. Palliative care is available to anyone with a serious illness. Services continue along with current treatments. Palliative care can help to manage symptoms, provide emotional and psychological support, and help with medical decision-making. Palliative stays in touch with your current healthcare providers about problems, changes, and any treatment recommendations.    ?? What Palliative Care Offers for MS  Palliative care teams typically include doctors, nurses, social workers, and chaplains who work alongside your neurologist. They help with:  1. Symptom Management Pain relief Muscle spasm control Fatigue reduction Bladder and bowel issues Emotional and psychological support  2. Communication and Decision Support Help understanding complex medical information Support with treatment choices aligned with personal goals Advance care planning  3. Support for Families and Caregivers Counseling and education Respite care options Help navigating long-term care or home support  4. Spiritual and Emotional Care Addressing existential concerns Supporting coping strategies Facilitating meaningful conversations  ?? When to [facility] Palliative Care It can begin at any stage of MS--not just at the end of life. Especially helpful during progressive stages or when symptoms become harder to manage.  If pt is interested in starting with palliative care services, place referral to [facility] (Palliative Care of [facility])  Address: [address], [place], [place] [address] Phone: [phone]

## 2024-08-23 NOTE — Telephone Encounter (Signed)
 [name] returned call stating patient has decided to go with the palliative care.

## 2024-08-23 NOTE — Telephone Encounter (Signed)
 Spoke to [place] , told her referral for Palliative care was placed for patient and someone should be getting in touch with you. [name] verbalized understanding.

## 2024-08-23 NOTE — Telephone Encounter (Signed)
Referral placed for Palliative care

## 2024-08-23 NOTE — Addendum Note (Signed)
 Addended by: THURMON ARLAND PARAS on: 08/23/2024 12:18 PM   Modules accepted: Orders

## 2024-08-28 DIAGNOSIS — R2243 Localized swelling, mass and lump, lower limb, bilateral: Secondary | ICD-10-CM | POA: Diagnosis not present

## 2024-08-28 DIAGNOSIS — G35 Multiple sclerosis: Secondary | ICD-10-CM | POA: Diagnosis not present

## 2024-08-28 DIAGNOSIS — R634 Abnormal weight loss: Secondary | ICD-10-CM | POA: Diagnosis not present

## 2024-08-29 DIAGNOSIS — G35 Multiple sclerosis: Secondary | ICD-10-CM | POA: Diagnosis not present

## 2024-09-12 DIAGNOSIS — G35 Multiple sclerosis: Secondary | ICD-10-CM | POA: Diagnosis not present

## 2024-09-12 DIAGNOSIS — R531 Weakness: Secondary | ICD-10-CM | POA: Diagnosis not present

## 2024-09-13 ENCOUNTER — Encounter (HOSPITAL_COMMUNITY): Payer: Self-pay

## 2024-09-13 ENCOUNTER — Other Ambulatory Visit: Payer: Self-pay

## 2024-09-13 ENCOUNTER — Emergency Department (HOSPITAL_COMMUNITY)
Admission: EM | Admit: 2024-09-13 | Discharge: 2024-09-13 | Disposition: A | Discharge status: Home / Self Care | Attending: Emergency Medicine | Admitting: Emergency Medicine

## 2024-09-13 DIAGNOSIS — G35 Multiple sclerosis: Secondary | ICD-10-CM | POA: Diagnosis not present

## 2024-09-13 DIAGNOSIS — F17210 Nicotine dependence, cigarettes, uncomplicated: Secondary | ICD-10-CM | POA: Diagnosis not present

## 2024-09-13 DIAGNOSIS — R4182 Altered mental status, unspecified: Secondary | ICD-10-CM | POA: Diagnosis present

## 2024-09-13 DIAGNOSIS — Z7401 Bed confinement status: Secondary | ICD-10-CM | POA: Diagnosis not present

## 2024-09-13 DIAGNOSIS — Z743 Need for continuous supervision: Secondary | ICD-10-CM | POA: Diagnosis not present
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
## 2024-09-13 NOTE — ED Notes (Signed)
 PTAR called for patient at this time.

## 2024-09-13 NOTE — ED Triage Notes (Signed)
 Pt BIB EMS from Home due to weakness and pt less active than normal according the to pt Home Health Aid. On arrival pt Ostomy was exposed with no bag leaking stool, pt full of stool and urine, bath given pt in gown. Hx of MS, pt uses electric wheelchair to get around. Pt reports feeling good, AAOx4. Pt has 3/10 pain/discomfort in right leg for about a month, otherwise, pt reports no other medical complaints.  CBG 148 SpO2 97% HR 60

## 2024-09-13 NOTE — ED Notes (Signed)
PTAR here for patient 
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
## 2024-09-13 NOTE — Care Management (Signed)
 Spoke with the ED CSW who spoke with sister requesting [facility] to assist with colostomy management notified EDP to place orders for [facility] services.
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
## 2024-09-13 NOTE — Discharge Instructions (Addendum)
 Follow-up with home health care.  Our social worker was gone for the day.  We understand there are ostomy supplies at home.  Return for any new or worse symptoms.

## 2024-09-14 ENCOUNTER — Telehealth: Payer: Self-pay

## 2024-09-14 NOTE — Telephone Encounter (Signed)
 Transition Care Management Unsuccessful Follow-up Telephone Call  Date of discharge and from where:  09/13/24 [facility]  Attempts:  1st Attempt  Reason for unsuccessful TCM follow-up call:  Left voice message for patient attempting to complete [facility] call regarding recent ED visit. Advised to return call and schedule ED follow up with PCP if needed. Please schedule patient for ED follow up if patient feels this is necessary or needed.

## 2024-09-26 DIAGNOSIS — G35 Multiple sclerosis: Secondary | ICD-10-CM | POA: Diagnosis not present

## 2024-10-10 DIAGNOSIS — G35D Multiple sclerosis, unspecified: Secondary | ICD-10-CM | POA: Diagnosis not present

## 2024-10-10 DIAGNOSIS — R2243 Localized swelling, mass and lump, lower limb, bilateral: Secondary | ICD-10-CM | POA: Diagnosis not present

## 2024-10-10 DIAGNOSIS — R634 Abnormal weight loss: Secondary | ICD-10-CM | POA: Diagnosis not present

## 2024-10-17 DIAGNOSIS — Z933 Colostomy status: Secondary | ICD-10-CM | POA: Diagnosis not present

## 2024-10-19 DIAGNOSIS — R2243 Localized swelling, mass and lump, lower limb, bilateral: Secondary | ICD-10-CM | POA: Diagnosis not present

## 2024-10-19 DIAGNOSIS — R634 Abnormal weight loss: Secondary | ICD-10-CM | POA: Diagnosis not present

## 2024-10-19 DIAGNOSIS — G35D Multiple sclerosis, unspecified: Secondary | ICD-10-CM | POA: Diagnosis not present

## 2024-11-03 ENCOUNTER — Telehealth: Payer: Self-pay | Admitting: *Deleted

## 2024-11-03 NOTE — Telephone Encounter (Signed)
 Unable to LVM due to VM is not set up

## 2024-11-03 NOTE — Telephone Encounter (Signed)
 Spoke to [place] pt's sister, told her unfortunately pt will need to be seen in order to do referral to Home Health. [name] verbalized understanding and said pt is scheduled for 11/13 if she can get him there. Told her if unable to make appt you can have him go back to the ED and then they can initiate orders for Home Health. [name] verbalized understanding.

## 2024-11-03 NOTE — Telephone Encounter (Signed)
 Copied from CRM [id number]. Topic: Referral - Question >> Nov 03, 2024 12:01 PM Brittany M wrote: Reason for CRM: patient looking for a referral for a mental health facility.
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
## 2024-11-03 NOTE — Telephone Encounter (Signed)
 Please have patient Triaged and if he suicidal will need to call 911 for him.
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
## 2024-11-03 NOTE — Telephone Encounter (Signed)
 Copied from CRM [id number]. Topic: Clinical - Medical Advice >> Nov 03, 2024  9:56 AM Berneda FALCON wrote: Reason for CRM: Sister, Gillian, is calling in requesting that we order Home Health for this patient. States that he has a colostomy and needs someone to come out and change the colostomy bag every 2-3 days. It has not been changed since Sunday. States the Aide is going to try to do it today, but that it is out of her scope and we need to request a home health nurse to do this for him, please.  Additionally, she states that [employer] is no longer his aide, and would like her to be removed from the [facility]. I let her know that this must be done in the office by the patient, and she states that is fine, but patient has memory issues, so please remind him to update the DPR at next appt (11/09/24)  Please call [name] back with any questions-[phone]

## 2024-11-08 ENCOUNTER — Emergency Department (HOSPITAL_COMMUNITY)
Admission: EM | Admit: 2024-11-08 | Discharge: 2024-11-09 | Disposition: A | Discharge status: Home / Self Care | Attending: Emergency Medicine | Admitting: Emergency Medicine

## 2024-11-08 DIAGNOSIS — Z433 Encounter for attention to colostomy: Secondary | ICD-10-CM | POA: Diagnosis present

## 2024-11-08 NOTE — Discharge Instructions (Addendum)
 Your colostomy was replaced  Please follow-up with your doctor  Return to ER if you have severe abdominal pain or any issue with colostomy bag

## 2024-11-08 NOTE — ED Provider Notes (Signed)
 [place] EMERGENCY DEPARTMENT AT [facility] Provider Note   CSN: [id number] Arrival date & time: 11/08/24  2243     Patient presents with: No chief complaint on file.   Nathan [facility] is a 48 y.o. male history of multiple sclerosis and is bedbound, colectomy with colostomy here presenting with colostomy falling off.  He states that he is bedbound and his colostomy fell off.  He states that his aide left for the day so called EMS.  He has colostomy supplies at home but he was not able to put it on himself.  Denies any abdominal pain or vomiting.  Patient is DNR.    The history is provided by the patient.       Prior to Admission medications   Medication Sig Start Date End Date Taking? Authorizing Provider  baclofen  (LIORESAL ) 10 MG tablet Take 10 mg by mouth 3 (three) times daily. 03/07/24   [provider]  HYDROcodone -acetaminophen  (NORCO/VICODIN) 5-325 MG tablet Take 1 tablet by mouth every 6 (six) hours as needed.    [provider]  Multiple Vitamins-Minerals (CENTRUM SILVER 50+MEN) TABS Take 1 tablet by mouth daily in the afternoon.    [provider]  oxybutynin  (DITROPAN ) 5 MG tablet Take 1 tablet (5 mg total) by mouth 2 (two) times daily. 06/21/23   [name], PA    Allergies: Patient has no known allergies.    Review of Systems  Gastrointestinal:        Colostomy bag fell off  All other systems reviewed and are negative.   Updated Vital Signs There were no vitals taken for this visit.  Physical Exam Vitals and nursing note reviewed.  Constitutional:      Comments: Chronically ill and bedbound  HENT:     Head: Normocephalic.     Nose: Nose normal.     Mouth/Throat:     Mouth: Mucous membranes are moist.  Eyes:     Extraocular Movements: Extraocular movements intact.     Pupils: Pupils are equal, round, and reactive to light.  Cardiovascular:     Rate and Rhythm: Normal rate.     Pulses: Normal pulses.   Pulmonary:     Effort: Pulmonary effort is normal.  Abdominal:     General: Abdomen is flat.     Comments: Patient has stoma in the left mid quadrant.  No obvious hernia around it.  The colostomy has fell off and is no longer on the patient  Musculoskeletal:        General: Normal range of motion.     Cervical back: Normal range of motion.  Skin:    General: Skin is warm.     Capillary Refill: Capillary refill takes less than 2 seconds.  Neurological:     Mental Status: He is alert.     Comments: Bed bound and contracted which is baseline  Psychiatric:        Mood and Affect: Mood normal.        Behavior: Behavior normal.     (all labs ordered are listed, but only abnormal results are displayed) Labs Reviewed - No data to display  EKG: None  Radiology: No results found.   Procedures   Medications Ordered in the ED - No data to display                                  Medical Decision  Making Nathan Marsh is a 48 y.o. male here presenting with colostomy bag needs replacement.  Patient is from home and is bedbound at baseline from multiple sclerosis.  Will replace colostomy bag.    Problems Addressed: Colostomy care [facility]): acute illness or injury    Final diagnoses:  None    ED Discharge Orders     None          Patt Alm Macho, MD 11/08/24 2333

## 2024-11-08 NOTE — ED Triage Notes (Signed)
 Pt lives at home and has day time aide. Says he thinks aide did not change the bag earlier and is full.

## 2024-11-09 ENCOUNTER — Ambulatory Visit: Admitting: Physician Assistant

## 2024-11-15 ENCOUNTER — Ambulatory Visit: Admitting: Physician Assistant

## 2024-11-16 ENCOUNTER — Telehealth: Payer: Self-pay | Admitting: Pharmacy Technician

## 2024-11-16 ENCOUNTER — Other Ambulatory Visit (HOSPITAL_COMMUNITY): Payer: Self-pay | Admitting: Acute Care

## 2024-11-16 ENCOUNTER — Encounter: Payer: Self-pay | Admitting: Physician Assistant

## 2024-11-16 NOTE — Telephone Encounter (Signed)
 Auth Submission: APPROVED Site of care: Site of care: [facility] Payer: [employer] DUAL Medication & CPT/J Code(s) submitted: Tysabri  (Natalizumab ) G7676 Diagnosis Code:  Route of submission (phone, fax, portal): PORTAL / LATENT Phone # Fax # Auth type: Buy/Bill PB Units/visits requested: 300MG  Q4WKS Reference number: [id number] Approval from: 11/16/24 to 11/16/25

## 2024-11-20 ENCOUNTER — Encounter: Payer: Self-pay | Admitting: Physician Assistant

## 2024-11-20 ENCOUNTER — Ambulatory Visit: Payer: Self-pay

## 2024-11-20 ENCOUNTER — Telehealth: Payer: Self-pay | Admitting: *Deleted

## 2024-11-20 NOTE — Telephone Encounter (Unsigned)
 Copied from CRM #[id number]. Topic: Clinical - Medical Advice >> Nov 20, 2024 12:13 PM Mia F wrote: Reason for CRM: Pt sister Chantell called stating she just talked to [employer] and they sent some samples out for pt to try. They worked well and she would like to switch over from pull ups to advanced briefs. He is leaking through the current ones he is wearing now. She says [employer] will be faxing over an order to be signed. She says with pt health condition he is unable to come in. She is planning to take him to the ER today so she is hoping this can be done without them having to come in     [phone] Chantell

## 2024-11-20 NOTE — Telephone Encounter (Signed)
 FYI, pt sent to ER. See Triage note.

## 2024-11-20 NOTE — Telephone Encounter (Signed)
 FYI Only or Action Required?: FYI only for provider: ED advised.  Patient was last seen in primary care on 08/09/2024 by [name], PA.  Called Nurse Triage reporting Altered Mental Status.  Symptoms began several days ago.  Interventions attempted: Rest, hydration, or home remedies.  Symptoms are: gradually worsening.  Triage Disposition: Call EMS 911 Now  Patient/caregiver understands and will follow disposition?: Yes  Copied from CRM #[id number]. Topic: Clinical - Red Word Triage >> Nov 20, 2024 10:30 AM Eva FALCON wrote: Red Word that prompted transfer to Nurse Triage: is having some confusion, seems like he blacks out for a minute or 2 and when he comes back he is confused. Happens more around 12PM and 8PM. Seems like his body goes limp and leans more to the left. Reason for Disposition  [1] Weakness of the face, arm, or leg on one side of the body AND [2] new-onset  Answer Assessment - Initial Assessment Questions Caregiver Harlene and sister calling to request appointment. MS-worsening, weakness is increasing-leaning to left. He is unable to assist with his transfers as he normally does. Increased confusion, Friday he had syncope for 1-2 minutes and seemed confused when he came too. He has not been evaluated for syncope. Also has new right sided leg swelling. Losing muscle tone.   Request referral to neurologist in [place].   Inquiring if he can have some visit for appointments due to limited mobility.   ER advised, family in agreement.     1. LEVEL OF CONSCIOUSNESS: How are they (the patient) acting right now? (e.g., alert-oriented, confused, lethargic, stuporous, comatose)     confused 2. ONSET: When did the confusion start?  (e.g., minutes, hours, days)     Friday 3. PATTERN: Does this come and go, or has it been constant since it started?  Is it present now?     Intermittent  4. ALCOHOL or DRUGS: Have they been drinking alcohol or taking any drugs?        5. NARCOTIC MEDICINES: Have they been receiving any narcotic medications? (e.g., morphine, Vicodin)      6. CAUSE: What do you think is causing the confusion?     unknown 7. OTHER SYMPTOMS: Are there any other symptoms? (e.g., difficulty breathing, fever, headache, weakness)     Right leg side is swollen  Protocols used: Confusion - Delirium-A-AH

## 2024-11-21 ENCOUNTER — Other Ambulatory Visit: Payer: Self-pay | Admitting: Physician Assistant

## 2024-11-21 ENCOUNTER — Telehealth: Payer: Self-pay | Admitting: *Deleted

## 2024-11-21 DIAGNOSIS — G35D Multiple sclerosis, unspecified: Secondary | ICD-10-CM

## 2024-11-21 NOTE — Telephone Encounter (Signed)
 Copied from CRM [id number]. Topic: Referral - Request for Referral >> Nov 20, 2024  3:41 PM Jasmin G wrote: Did the patient discuss referral with their provider in the last year? Yes (If No - schedule appointment) (If Yes - send message)  Appointment offered? No  Type of order/referral and detailed reason for visit: Neurology  Preference of office, provider, location: Neurological [facility] at [facility], Phone #: [phone]. Pt requested for referral to be sent ASAP as he state that he really needs it.  If referral order, have you been seen by this specialty before? No (If Yes, this issue or another issue? When? Where?  Can we respond through MyChart? No

## 2024-11-21 NOTE — Telephone Encounter (Signed)
 Received fax from [facility], signed and faxed back.

## 2024-11-21 NOTE — Telephone Encounter (Signed)
 Tried to contact pt no answer, unable to leave message voicemail not set up. Referral placed for [facility] Neurology.

## 2024-11-28 ENCOUNTER — Ambulatory Visit: Admitting: Neurology

## 2024-11-30 ENCOUNTER — Ambulatory Visit

## 2024-11-30 VITALS — BP 112/76 | HR 74 | Temp 98.0°F | Resp 16 | Ht 68.0 in | Wt 118.0 lb

## 2024-11-30 DIAGNOSIS — G35D Multiple sclerosis, unspecified: Secondary | ICD-10-CM | POA: Diagnosis not present

## 2024-11-30 MED ORDER — SODIUM CHLORIDE 0.9 % IV SOLN
300.0000 mg | Freq: Once | INTRAVENOUS | Status: AC
  Administered 2024-11-30: 300 mg via INTRAVENOUS
  Filled 2024-11-30: qty 15

## 2024-11-30 MED ORDER — LORATADINE 10 MG PO TABS
10.0000 mg | ORAL_TABLET | Freq: Once | ORAL | Status: AC
  Administered 2024-11-30: 10 mg via ORAL
  Filled 2024-11-30: qty 1

## 2024-11-30 MED ORDER — ACETAMINOPHEN 325 MG PO TABS
650.0000 mg | ORAL_TABLET | Freq: Once | ORAL | Status: AC
  Administered 2024-11-30: 650 mg via ORAL
  Filled 2024-11-30: qty 2

## 2024-11-30 NOTE — Patient Instructions (Signed)
 Natalizumab  Injection What is this medication? NATALIZUMAB  (na ta LIZ you mab) treats multiple sclerosis (MS). It works by slowing down an overactive immune system, which prevents or delays worsening symptoms. It also decreases the number of flare-ups. It may also be used to treat Crohn's disease. It is a monoclonal antibody. This medicine may be used for other purposes; ask your health care provider or pharmacist if you have questions. COMMON BRAND NAME(S): Tysabri  What should I tell my care team before I take this medication? They need to know if you have any of these conditions: Immune system problems Progressive multifocal leukoencephalopathy (PML) An unusual or allergic reaction to natalizumab , other medications, foods, dyes, or preservatives Pregnant or trying to get pregnant Breast-feeding How should I use this medication? This medication is injected into a vein. It is given by your care team in a hospital or clinic setting. A special MedGuide will be given to you by the pharmacist with each prescription and refill. Be sure to read this information carefully each time. Talk to your care team about the use of this medication in children. Special care may be needed. Overdosage: If you think you have taken too much of this medicine contact a poison control center or emergency room at once. NOTE: This medicine is only for you. Do not share this medicine with others. What if I miss a dose? Keep appointments for follow-up doses. It is important not to miss your dose. Call your care team if you are unable to keep an appointment. What may interact with this medication? Do not take this medication with any of the following: Biologic medications, such as adalimumab, certolizumab, etanercept, golimumab, infliximab This medication may also interact with the following: Azathioprine Cyclosporine Interferons Mercaptopurine Methotrexate Other medications that lower your chance of fighting an  infection Steroid medications, such as prednisone or cortisone Vaccines This list may not describe all possible interactions. Give your health care provider a list of all the medicines, herbs, non-prescription drugs, or dietary supplements you use. Also tell them if you smoke, drink alcohol, or use illegal drugs. Some items may interact with your medicine. What should I watch for while using this medication? Your condition will be monitored carefully while you are receiving this medication. Tell your care team if your symptoms do not start to get better or if they get worse. This medication may increase your risk of getting an infection. Call your care team for advice if you get a fever, chills, sore throat, or other symptoms of a cold or flu. Do not treat yourself. Try to avoid being around people who are sick. In some patients, this medication may cause a serious brain infection that may cause death. If you have any problems seeing, thinking, speaking, walking, or standing, tell your care team right away. If you cannot reach your care team, urgently seek other source of medical care. What side effects may I notice from receiving this medication? Side effects that you should report to your care team as soon as possible: Allergic reactions--skin rash, itching, hives, swelling of the face, lips, tongue, or throat Dizziness, loss of balance or coordination, confusion or trouble speaking Eye pain, change in vision, vision loss Fever, neck pain or stiffness, sensitivity to light, headache, nausea, vomiting, confusion, which may be signs of meningitis Infection--fever, chills, cough, sore throat, wounds that don't heal, pain or trouble when passing urine, general feeling of discomfort or being unwell Liver injury--right upper belly pain, loss of appetite, nausea, light-colored stool, dark  yellow or brown urine, yellowing skin or eyes, unusual weakness or fatigue Unusual bruising or bleeding Side effects  that usually do not require medical attention (report these to your care team if they continue or are bothersome): Diarrhea Fatigue Headache Joint pain Nausea Vomiting Worsening mood, feelings of depression This list may not describe all possible side effects. Call your doctor for medical advice about side effects. You may report side effects to FDA at 1-800-FDA-1088. Where should I keep my medication? This medication is given in a hospital or clinic. It will not be stored at home. NOTE: This sheet is a summary. It may not cover all possible information. If you have questions about this medicine, talk to your doctor, pharmacist, or health care provider.  2024 Elsevier/Gold Standard (2023-11-26 00:00:00)

## 2024-11-30 NOTE — Progress Notes (Signed)
 Diagnosis: Multiple Sclerosis  Provider:  Lonna Coder MD  Procedure: IV Infusion  IV Type: Peripheral, IV Location: L Antecubital    Tysabri  (Natalizumab ), Dose: 300 mg  Infusion Start Time: 1105  Infusion Stop Time: 1208  Post Infusion IV Care: Patient declined observation and Peripheral IV Discontinued  Discharge: Condition: Good, Destination: Home . AVS Provided  Performed by:  Maximiano JONELLE Pouch, LPN

## 2024-12-04 ENCOUNTER — Ambulatory Visit: Admitting: Physician Assistant

## 2024-12-04 ENCOUNTER — Encounter: Payer: Self-pay | Admitting: Physician Assistant

## 2024-12-04 ENCOUNTER — Telehealth: Payer: Self-pay | Admitting: Physician Assistant

## 2024-12-04 VITALS — BP 124/74 | HR 78 | Temp 98.0°F

## 2024-12-04 DIAGNOSIS — G35D Multiple sclerosis, unspecified: Secondary | ICD-10-CM

## 2024-12-04 DIAGNOSIS — R32 Unspecified urinary incontinence: Secondary | ICD-10-CM

## 2024-12-04 DIAGNOSIS — Z939 Artificial opening status, unspecified: Secondary | ICD-10-CM

## 2024-12-04 DIAGNOSIS — Z720 Tobacco use: Secondary | ICD-10-CM

## 2024-12-04 DIAGNOSIS — Z23 Encounter for immunization: Secondary | ICD-10-CM

## 2024-12-04 DIAGNOSIS — R531 Weakness: Secondary | ICD-10-CM

## 2024-12-04 NOTE — Telephone Encounter (Signed)
 Please call patients case worker Manon Amass at [phone].  Please let her know that we would like to increase his care hours to ideally 24 hours per day. Please let me know how to initiate this process.  Lucie Buttner

## 2024-12-04 NOTE — Patient Instructions (Signed)
  VISIT SUMMARY: You had a follow-up visit to discuss your neurological issues and overall health. We talked about your care needs, colostomy status, neurologic impairment, neurogenic bladder, chronic pain, and tobacco use. We also discussed the importance of getting a flu shot this year.  YOUR PLAN: CARE NEEDS DUE TO FUNCTIONAL IMPAIRMENT: You need assistance with daily activities and there are safety concerns when you are alone. -We will contact Joy to discuss increasing your care hours.  COLOSTOMY STATUS: Your colostomy bag is functioning well with no current concerns. -Continue your current colostomy care regimen.  NEUROLOGIC IMPAIRMENT WITH LEFT-SIDED WEAKNESS AND MUSCLE SPASTICITY: You have left-sided weakness and muscle spasticity. Your baclofen  has been adjusted to twice daily to manage spasticity and prevent grogginess. -Continue taking baclofen  twice daily. -Discuss the potential change in neurologist with your current provider. -Bring up changes in weakness and strength with your neurologist.  NEUROGENIC BLADDER: Your bladder issues are managed with oxybutynin . -Continue taking oxybutynin  as prescribed.  CHRONIC PAIN: Your chronic pain is managed with Tylenol  PM at night. -Continue taking Tylenol  PM at night.  TOBACCO USE: You have reduced your tobacco use to one or two cigarettes per day. -Continue to monitor your tobacco use and try to reduce it further.  GENERAL HEALTH MAINTENANCE: You have not received a flu shot this year. -Get a flu shot to protect yourself during the flu season.                      Contains text generated by [employer]bridge.                                 Contains text generated by [employer]bridge.

## 2024-12-04 NOTE — Telephone Encounter (Signed)
 Called and left a detailed message with Manon Amass at [phone]. Waiting for a return call.

## 2024-12-04 NOTE — Progress Notes (Signed)
 History of Present Illness:   Chief Complaint  Patient presents with   Follow-up    Here for a 3 month follow-up. Also needs some transportation and [facility] paperwork filled out. Has some updates for PCP.     Discussed the use of AI scribe software for clinical note transcription with the patient, who gave verbal consent to proceed.  History of Present Illness   Nathan Marsh is a 48 year old male who presents for a follow-up visit regarding neurological issues. He is accompanied by Harlene, his caregiver.  He notes a change in his neurologic status, with strength now greater on the left side than the right. His left-sided strength allows him to help with tasks like connecting and disconnecting the [place] lift, which improves his hand stability.  His colostomy bag is functioning well and is usually cleaned three times a week, with adequate supplies. On weekends, when different aides are present, it is cleaned less often.  He uses Medicaid transportation for medical appointments because of transportation limitations.  He takes baclofen , now reduced from three times daily to twice daily due to grogginess and leaning to one side. He takes Tylenol  PM at night for sleep and oxybutynin  for bladder management.  He has cut back smoking to one or two cigarettes per day.  His power wheelchair is not working, and he is using a manual wheelchair for now.  He has weekend aides but their availability is inconsistent. He is alone from 9 PM to 9 AM and feels he needs more consistent, especially overnight, care.     Continues to have issues with incontinence multiple times per day. Continues on oxybutynin . Requires incontinence supplies throughout the today to help with hygiene management.  Past Medical History:  Diagnosis Date   H/O colectomy    H/O splenectomy    Multiple sclerosis    Wheelchair dependent      Social History   Tobacco Use   Smoking status: Every Day    Current packs/day:  0.50    Average packs/day: 0.5 packs/day for 24.5 years (12.3 ttl pk-yrs)    Types: Cigarettes    Start date: 05/31/2000   Smokeless tobacco: Never  Vaping Use   Vaping status: Never Used  Substance Use Topics   Alcohol use: Yes    Comment: very limited   Drug use: Yes    Types: Marijuana    Past Surgical History:  Procedure Laterality Date   AMPUTATION Left 04/27/2017   Procedure: AMPUTATION DIGIT REVISION LEFT MIDDLE FINGER;  Surgeon: Balinda Rogue, MD;  Location: [facility] OR;  Service: Orthopedics;  Laterality: Left;   COLOSTOMY  05/24/2024   SKIN GRAFT SPLIT THICKNESS TRUNK  05/24/2024   SPLENECTOMY  05/24/2024   SUBTOTAL COLECTOMY Left 05/24/2024    Family History  Problem Relation Age of Onset   Cancer Mother        lung    No Known Allergies  Current Medications:   Current Outpatient Medications:    baclofen  (LIORESAL ) 10 MG tablet, Take 10 mg by mouth 3 (three) times daily., Disp: , Rfl:    baclofen  (LIORESAL ) 10 MG tablet, Take 10 mg by mouth 3 (three) times daily., Disp: , Rfl:    Multiple Vitamins-Minerals (CENTRUM SILVER 50+MEN) TABS, Take 1 tablet by mouth daily in the afternoon., Disp: , Rfl:    oxybutynin  (DITROPAN ) 5 MG tablet, Take 1 tablet (5 mg total) by mouth 2 (two) times daily., Disp: 60 tablet, Rfl: 5   Review  of Systems:   Negative unless otherwise specified per HPI.  Vitals:   Vitals:   12/04/24 1012  BP: 124/74  Pulse: 78  Temp: 98 F (36.7 C)  TempSrc: Temporal  SpO2: 98%     There is no height or weight on file to calculate BMI.  Physical Exam:   Physical Exam Vitals and nursing note reviewed.  Constitutional:      Appearance: He is well-developed.     Comments: Wheelchair  HENT:     Head: Normocephalic.  Eyes:     Conjunctiva/sclera: Conjunctivae normal.     Pupils: Pupils are equal, round, and reactive to light.  Pulmonary:     Effort: Pulmonary effort is normal.  Musculoskeletal:        General: Normal range of motion.      Cervical back: Normal range of motion.  Skin:    General: Skin is warm and dry.  Neurological:     Mental Status: He is alert and oriented to person, place, and time.     Motor: Weakness present.  Psychiatric:        Speech: Speech is delayed.        Behavior: Behavior normal.        Thought Content: Thought content normal.        Judgment: Judgment normal.     Assessment and Plan:   Assessment and Plan    Weakness Functional impairment requiring assistance with daily activities. Discussion about increasing care hours due to safety concerns when alone. Palliative care evaluation requested by family for additional resources. - Will contact Joy to discuss increasing care hours.  History of creation of ostomy [facility])  Colostomy bag is functioning well with no current concerns. - Continue current colostomy care regimen.  Multiple sclerosis  Left-sided weakness with muscle spasticity. Baclofen  adjusted to twice daily to manage spasticity and prevent grogginess. Discussion about potential change in neurologist to [facility] for convenience, but he prefers to continue with current neurologist in [place]. - Continue baclofen  twice daily. - Discuss neurologist change with current provider. - Bring up changes in weakness and strength with neurologist.  Urinary incontinence Managed with oxybutynin . - Continue oxybutynin  as prescribed. - Continue incontinence supplies for regular hygiene purposes  Tobacco use Reduced tobacco use due to cost and caregiver's influence. He is allowed to smoke occasionally as a reward. - Continue to monitor tobacco use and encourage reduction.  General Health Maintenance No flu shot received this year. Discussion about the current flu season and the importance of vaccination.       Lucie Buttner, PA-C

## 2024-12-05 NOTE — Telephone Encounter (Signed)
Pt aware of referral.

## 2024-12-07 NOTE — Telephone Encounter (Signed)
 [facility] is a stated funded program and is Medicaid funded. Normal care hours are 40 hours a week. Aksh gets around 56 hours a week. Family has to cover additional hours or patient will need to be placed in Nursing Care facility.

## 2024-12-11 ENCOUNTER — Telehealth: Payer: Self-pay | Admitting: *Deleted

## 2024-12-11 ENCOUNTER — Telehealth: Payer: Self-pay | Admitting: Neurology

## 2024-12-11 NOTE — Telephone Encounter (Signed)
 Tried to contact pt no answer unable to leave message voicemail not set up.

## 2024-12-11 NOTE — Telephone Encounter (Signed)
 Appointment details confirmed

## 2024-12-11 NOTE — Telephone Encounter (Signed)
 Copied from CRM #[id number]. Topic: General - Other >> Dec 11, 2024 11:23 AM Franky GRADE wrote: Reason for CRM: Patient was seen on 12/04/2024 and left some paper work for [employer] to fill out, they would like to know if the original copy could be mailed to the home address on file.
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
## 2024-12-13 NOTE — Telephone Encounter (Signed)
 Spoke to [place] told her I do not have a copy of the paperwork. It was faxed over to [facility] and sent to scan, but is not in his chart yet. [name] verbalized understanding and said she will check back in a few days.

## 2024-12-13 NOTE — Telephone Encounter (Signed)
 Spoke to [place], pt's sister told her pt's casework [place] said [facility] is a stated funded program and is Medicaid funded. Normal care hours are 40 hours a week. [facility] gets around 56 hours a week. Family has to cover additional hours or patient will need to be placed in Nursing Care facility. [name] verbalized understanding and said she was already told that by [place] and that pt keeps forgetting it was discussed. Told her okay.

## 2024-12-14 ENCOUNTER — Ambulatory Visit: Admitting: Neurology

## 2024-12-14 ENCOUNTER — Encounter: Payer: Self-pay | Admitting: Neurology

## 2024-12-14 VITALS — BP 110/78 | HR 80 | Temp 98.0°F | Ht 66.5 in | Wt 118.0 lb

## 2024-12-14 DIAGNOSIS — G250 Essential tremor: Secondary | ICD-10-CM | POA: Diagnosis not present

## 2024-12-14 DIAGNOSIS — G35C1 Active secondary progressive multiple sclerosis: Secondary | ICD-10-CM

## 2024-12-14 DIAGNOSIS — Z79899 Other long term (current) drug therapy: Secondary | ICD-10-CM

## 2024-12-14 DIAGNOSIS — Z87828 Personal history of other (healed) physical injury and trauma: Secondary | ICD-10-CM | POA: Diagnosis not present

## 2024-12-14 DIAGNOSIS — F09 Unspecified mental disorder due to known physiological condition: Secondary | ICD-10-CM

## 2024-12-14 DIAGNOSIS — G252 Other specified forms of tremor: Secondary | ICD-10-CM

## 2024-12-14 DIAGNOSIS — E559 Vitamin D deficiency, unspecified: Secondary | ICD-10-CM | POA: Diagnosis not present

## 2024-12-14 DIAGNOSIS — G35D Multiple sclerosis, unspecified: Secondary | ICD-10-CM | POA: Diagnosis not present

## 2024-12-22 ENCOUNTER — Ambulatory Visit: Payer: Self-pay | Admitting: Neurology

## 2024-12-22 LAB — CBC WITH DIFFERENTIAL/PLATELET
Basophils Absolute: 0.1 x10E3/uL (ref 0.0–0.2)
Basos: 1 %
EOS (ABSOLUTE): 0.3 x10E3/uL (ref 0.0–0.4)
Eos: 4 %
Hematocrit: 42.5 % (ref 37.5–51.0)
Hemoglobin: 12.6 g/dL — ABNORMAL LOW (ref 13.0–17.7)
Immature Grans (Abs): 0 x10E3/uL (ref 0.0–0.1)
Immature Granulocytes: 0 %
Lymphocytes Absolute: 3 x10E3/uL (ref 0.7–3.1)
Lymphs: 34 %
MCH: 23.4 pg — ABNORMAL LOW (ref 26.6–33.0)
MCHC: 29.6 g/dL — ABNORMAL LOW (ref 31.5–35.7)
MCV: 79 fL (ref 79–97)
Monocytes Absolute: 0.9 x10E3/uL (ref 0.1–0.9)
Monocytes: 10 %
NRBC: 11 % — ABNORMAL HIGH (ref 0–0)
Neutrophils Absolute: 4.6 x10E3/uL (ref 1.4–7.0)
Neutrophils: 51 %
Platelets: 250 x10E3/uL (ref 150–450)
RBC: 5.39 x10E6/uL (ref 4.14–5.80)
RDW: 17.5 % — ABNORMAL HIGH (ref 11.6–15.4)
WBC: 8.9 x10E3/uL (ref 3.4–10.8)

## 2024-12-22 LAB — STRATIFY JCV(TM) AB W/INDEX
JCV Antibody: NEGATIVE
JCV Index Value: 0.17

## 2024-12-22 LAB — VITAMIN D 25 HYDROXY (VIT D DEFICIENCY, FRACTURES): Vit D, 25-Hydroxy: 22.7 ng/mL — ABNORMAL LOW (ref 30.0–100.0)

## 2024-12-23 ENCOUNTER — Inpatient Hospital Stay (HOSPITAL_COMMUNITY)
Admission: EM | Admit: 2024-12-23 | Discharge: 2024-12-28 | DRG: 389 | Disposition: A | Discharge status: Home / Self Care | Attending: Family Medicine | Admitting: Family Medicine

## 2024-12-23 ENCOUNTER — Emergency Department (HOSPITAL_COMMUNITY)

## 2024-12-23 ENCOUNTER — Other Ambulatory Visit: Payer: Self-pay

## 2024-12-23 ENCOUNTER — Encounter (HOSPITAL_COMMUNITY): Payer: Self-pay | Admitting: Emergency Medicine

## 2024-12-23 DIAGNOSIS — E860 Dehydration: Secondary | ICD-10-CM | POA: Diagnosis present

## 2024-12-23 DIAGNOSIS — Z933 Colostomy status: Secondary | ICD-10-CM | POA: Diagnosis not present

## 2024-12-23 DIAGNOSIS — Z79899 Other long term (current) drug therapy: Secondary | ICD-10-CM | POA: Diagnosis not present

## 2024-12-23 DIAGNOSIS — K56601 Complete intestinal obstruction, unspecified as to cause: Principal | ICD-10-CM | POA: Diagnosis present

## 2024-12-23 DIAGNOSIS — K56609 Unspecified intestinal obstruction, unspecified as to partial versus complete obstruction: Principal | ICD-10-CM | POA: Diagnosis present

## 2024-12-23 DIAGNOSIS — F17210 Nicotine dependence, cigarettes, uncomplicated: Secondary | ICD-10-CM | POA: Diagnosis present

## 2024-12-23 DIAGNOSIS — Z89022 Acquired absence of left finger(s): Secondary | ICD-10-CM | POA: Diagnosis not present

## 2024-12-23 DIAGNOSIS — R112 Nausea with vomiting, unspecified: Secondary | ICD-10-CM | POA: Diagnosis present

## 2024-12-23 DIAGNOSIS — Z9049 Acquired absence of other specified parts of digestive tract: Secondary | ICD-10-CM | POA: Diagnosis not present

## 2024-12-23 DIAGNOSIS — I371 Nonrheumatic pulmonary valve insufficiency: Secondary | ICD-10-CM | POA: Diagnosis not present

## 2024-12-23 DIAGNOSIS — Z1152 Encounter for screening for COVID-19: Secondary | ICD-10-CM

## 2024-12-23 DIAGNOSIS — D72829 Elevated white blood cell count, unspecified: Secondary | ICD-10-CM | POA: Diagnosis present

## 2024-12-23 DIAGNOSIS — Z66 Do not resuscitate: Secondary | ICD-10-CM | POA: Diagnosis present

## 2024-12-23 DIAGNOSIS — G35D Multiple sclerosis, unspecified: Secondary | ICD-10-CM | POA: Diagnosis present

## 2024-12-23 DIAGNOSIS — N319 Neuromuscular dysfunction of bladder, unspecified: Secondary | ICD-10-CM | POA: Diagnosis present

## 2024-12-23 DIAGNOSIS — R7989 Other specified abnormal findings of blood chemistry: Secondary | ICD-10-CM | POA: Diagnosis not present

## 2024-12-23 DIAGNOSIS — I5A Non-ischemic myocardial injury (non-traumatic): Secondary | ICD-10-CM | POA: Diagnosis present

## 2024-12-23 DIAGNOSIS — Z993 Dependence on wheelchair: Secondary | ICD-10-CM | POA: Diagnosis not present

## 2024-12-23 DIAGNOSIS — Z9081 Acquired absence of spleen: Secondary | ICD-10-CM | POA: Diagnosis not present

## 2024-12-23 LAB — URINALYSIS, ROUTINE W REFLEX MICROSCOPIC
Bilirubin Urine: NEGATIVE
Glucose, UA: NEGATIVE mg/dL
Hgb urine dipstick: NEGATIVE
Ketones, ur: NEGATIVE mg/dL
Leukocytes,Ua: NEGATIVE
Nitrite: NEGATIVE
Protein, ur: 100 mg/dL — AB
Specific Gravity, Urine: 1.01 (ref 1.005–1.030)
pH: 7 (ref 5.0–8.0)

## 2024-12-23 LAB — RESP PANEL BY RT-PCR (RSV, FLU A&B, COVID)  RVPGX2
Influenza A by PCR: NEGATIVE
Influenza B by PCR: NEGATIVE
Resp Syncytial Virus by PCR: NEGATIVE
SARS Coronavirus 2 by RT PCR: NEGATIVE

## 2024-12-23 LAB — CBC
HCT: 48.4 % (ref 39.0–52.0)
Hemoglobin: 15.1 g/dL (ref 13.0–17.0)
MCH: 23.6 pg — ABNORMAL LOW (ref 26.0–34.0)
MCHC: 31.2 g/dL (ref 30.0–36.0)
MCV: 75.5 fL — ABNORMAL LOW (ref 80.0–100.0)
Platelets: 281 K/uL (ref 150–400)
RBC: 6.41 MIL/uL — ABNORMAL HIGH (ref 4.22–5.81)
RDW: 19.3 % — ABNORMAL HIGH (ref 11.5–15.5)
WBC: 12.5 K/uL — ABNORMAL HIGH (ref 4.0–10.5)
nRBC: 6 % — ABNORMAL HIGH (ref 0.0–0.2)

## 2024-12-23 LAB — COMPREHENSIVE METABOLIC PANEL WITH GFR
ALT: 25 U/L (ref 0–44)
AST: 22 U/L (ref 15–41)
Albumin: 5.1 g/dL — ABNORMAL HIGH (ref 3.5–5.0)
Alkaline Phosphatase: 95 U/L (ref 38–126)
Anion gap: 14 (ref 5–15)
BUN: 20 mg/dL (ref 6–20)
CO2: 26 mmol/L (ref 22–32)
Calcium: 10.8 mg/dL — ABNORMAL HIGH (ref 8.9–10.3)
Chloride: 99 mmol/L (ref 98–111)
Creatinine, Ser: 0.91 mg/dL (ref 0.61–1.24)
GFR, Estimated: >60 mL/min
Glucose, Bld: 117 mg/dL — ABNORMAL HIGH (ref 70–99)
Potassium: 4.2 mmol/L (ref 3.5–5.1)
Sodium: 140 mmol/L (ref 135–145)
Total Bilirubin: 0.7 mg/dL (ref 0.0–1.2)
Total Protein: 8.8 g/dL — ABNORMAL HIGH (ref 6.5–8.1)

## 2024-12-23 LAB — LIPASE, BLOOD: Lipase: 40 U/L (ref 11–51)

## 2024-12-23 LAB — LACTIC ACID, PLASMA: Lactic Acid, Venous: 2.2 mmol/L (ref 0.5–1.9)

## 2024-12-23 LAB — URINALYSIS, MICROSCOPIC (REFLEX)

## 2024-12-23 MED ORDER — ACETAMINOPHEN 325 MG PO TABS: 650.0000 mg | ORAL_TABLET | Freq: Four times a day (QID) | ORAL | Status: DC | PRN

## 2024-12-23 MED ORDER — IOHEXOL 350 MG/ML SOLN
75.0000 mL | Freq: Once | INTRAVENOUS | Status: AC | PRN
  Administered 2024-12-23: 75 mL via INTRAVENOUS

## 2024-12-23 MED ORDER — ONDANSETRON HCL 4 MG/2ML IJ SOLN
4.0000 mg | Freq: Four times a day (QID) | INTRAMUSCULAR | Status: DC | PRN
  Administered 2024-12-26: 4 mg via INTRAVENOUS

## 2024-12-23 MED ORDER — LACTATED RINGERS IV BOLUS
1000.0000 mL | Freq: Once | INTRAVENOUS | Status: AC
  Administered 2024-12-23: 1000 mL via INTRAVENOUS

## 2024-12-23 MED ORDER — MORPHINE SULFATE (PF) 4 MG/ML IV SOLN
4.0000 mg | Freq: Once | INTRAVENOUS | Status: AC
  Administered 2024-12-23: 4 mg via INTRAVENOUS
  Filled 2024-12-23: qty 1

## 2024-12-23 MED ORDER — ONDANSETRON HCL 4 MG/2ML IJ SOLN
4.0000 mg | Freq: Once | INTRAMUSCULAR | Status: AC
  Administered 2024-12-23: 4 mg via INTRAVENOUS
  Filled 2024-12-23: qty 2

## 2024-12-23 MED ORDER — ACETAMINOPHEN 650 MG RE SUPP: 650.0000 mg | Freq: Four times a day (QID) | RECTAL | Status: DC | PRN

## 2024-12-23 MED ORDER — SODIUM CHLORIDE 0.9 % IV SOLN: INTRAVENOUS | Status: DC

## 2024-12-23 NOTE — ED Notes (Signed)
 Pt spoke with MD and pt consented to NG tube insertion, total of 3 unsuccessful attempts to insert NG tube by 2 Rns, pt states to stop and refuses additional insertion attempts at this time, MD notified.

## 2024-12-23 NOTE — ED Notes (Signed)
 Pt refuses NG tube placement at this time, pt educated and states his pain is managable at this time. MD notified.

## 2024-12-23 NOTE — ED Notes (Signed)
 Pt returned from CT

## 2024-12-23 NOTE — ED Triage Notes (Signed)
 pT [facility] from home, bedbound, sometimes wheelchair. Hx of MS. For emesis all night. Has been feeling chills and hot. NT found him with vomit all over him.  Question how well pt can protect airway do to limited mobility.  99.6 temporal, 122/palp, 130/90, Hr 84 94% RA.

## 2024-12-23 NOTE — ED Provider Notes (Signed)
 " [facility] Provider Note   CSN: [id number] Arrival date & time: 12/23/24  1147     Patient presents with: No chief complaint on file.   Nathan Marsh is a 48 y.o. male.   Pt with hx MS, with recurrent nausea/vomiting since last night. Pt unsure of color of emesis. Several episodes. Denies abd pain. Has been having normal ostomy output. Denies fevers. No cough or uri symptoms. No headache. No neck pain/stiffness. No chest pain or sob. No extremity pain or swelling. No known bad food ingestion or ill contacts. Pt limited historian - level 5 caveat.   The history is provided by the patient and medical records. The history is limited by the condition of the patient.       Prior to Admission medications  Medication Sig Start Date End Date Taking? Authorizing Provider  baclofen  (LIORESAL ) 10 MG tablet Take 10 mg by mouth 3 (three) times daily. 03/07/24   [provider]  oxybutynin  (DITROPAN ) 5 MG tablet Take 1 tablet (5 mg total) by mouth 2 (two) times daily. 06/21/23   [name], PA    Allergies: Patient has no known allergies.    Review of Systems  Constitutional:  Negative for chills and fever.  HENT:  Negative for sore throat and trouble swallowing.   Respiratory:  Negative for cough and shortness of breath.   Cardiovascular:  Negative for chest pain.  Gastrointestinal:  Positive for nausea and vomiting. Negative for abdominal pain.  Genitourinary:  Negative for dysuria and flank pain.  Musculoskeletal:  Negative for back pain and neck pain.  Skin:  Negative for rash.  Neurological:  Negative for headaches.    Updated Vital Signs BP (!) 143/91 (BP Location: Left Arm)   Pulse 83   Temp 98.3 F (36.8 C) (Oral)   Resp 17   SpO2 97%   Physical Exam Vitals and nursing note reviewed.  Constitutional:      Appearance: Normal appearance. He is well-developed.  HENT:     Head: Atraumatic.     Nose: Nose  normal.     Mouth/Throat:     Mouth: Mucous membranes are moist.     Pharynx: Oropharynx is clear.  Eyes:     General: No scleral icterus.    Conjunctiva/sclera: Conjunctivae normal.     Pupils: Pupils are equal, round, and reactive to light.  Neck:     Trachea: No tracheal deviation.  Cardiovascular:     Rate and Rhythm: Normal rate and regular rhythm.     Pulses: Normal pulses.     Heart sounds: Normal heart sounds. No murmur heard.    No friction rub. No gallop.  Pulmonary:     Effort: Pulmonary effort is normal. No accessory muscle usage or respiratory distress.     Breath sounds: Normal breath sounds.  Abdominal:     General: Bowel sounds are normal. There is distension.     Palpations: Abdomen is soft.     Tenderness: There is abdominal tenderness. There is no guarding.     Comments: Mild distension and tenderness. Ostomy appears patent, functioning, brown stool in bag.   Genitourinary:    Comments: No cva tenderness. Musculoskeletal:        General: No swelling or tenderness.     Cervical back: Normal range of motion and neck supple. No rigidity.  Skin:    General: Skin is warm and dry.     Findings: No rash.  Neurological:     Mental Status: He is alert.     Comments: No new focal deficit noted.   Psychiatric:        Mood and Affect: Mood normal.     (all labs ordered are listed, but only abnormal results are displayed) Results for orders placed or performed during the hospital encounter of 12/23/24  CBC   Collection Time: 12/23/24  1:10 PM  Result Value Ref Range   WBC 12.5 (H) 4.0 - 10.5 K/uL   RBC 6.41 (H) 4.22 - 5.81 MIL/uL   Hemoglobin 15.1 13.0 - 17.0 g/dL   HCT 51.5 60.9 - 47.9 %   MCV 75.5 (L) 80.0 - 100.0 fL   MCH 23.6 (L) 26.0 - 34.0 pg   MCHC 31.2 30.0 - 36.0 g/dL   RDW 80.6 (H) 88.4 - 84.4 %   Platelets 281 150 - 400 K/uL   nRBC 6.0 (H) 0.0 - 0.2 %  Comprehensive metabolic panel with GFR   Collection Time: 12/23/24  1:10 PM  Result Value Ref  Range   Sodium 140 135 - 145 mmol/L   Potassium 4.2 3.5 - 5.1 mmol/L   Chloride 99 98 - 111 mmol/L   CO2 26 22 - 32 mmol/L   Glucose, Bld 117 (H) 70 - 99 mg/dL   BUN 20 6 - 20 mg/dL   Creatinine, Ser 9.08 0.61 - 1.24 mg/dL   Calcium  10.8 (H) 8.9 - 10.3 mg/dL   Total Protein 8.8 (H) 6.5 - 8.1 g/dL   Albumin 5.1 (H) 3.5 - 5.0 g/dL   AST 22 15 - 41 U/L   ALT 25 0 - 44 U/L   Alkaline Phosphatase 95 38 - 126 U/L   Total Bilirubin 0.7 0.0 - 1.2 mg/dL   GFR, Estimated >39 >39 mL/min   Anion gap 14 5 - 15  Lipase, blood   Collection Time: 12/23/24  1:10 PM  Result Value Ref Range   Lipase 40 11 - 51 U/L  Resp panel by RT-PCR (RSV, Flu A&B, Covid) Anterior Nasal Swab   Collection Time: 12/23/24  2:43 PM   Specimen: Anterior Nasal Swab  Result Value Ref Range   SARS Coronavirus 2 by RT PCR NEGATIVE NEGATIVE   Influenza A by PCR NEGATIVE NEGATIVE   Influenza B by PCR NEGATIVE NEGATIVE   Resp Syncytial Virus by PCR NEGATIVE NEGATIVE     EKG: None  Radiology: CT ABDOMEN PELVIS W CONTRAST Result Date: 12/23/2024 IMPRESSION: 1. High grade small bowel obstruction. No pneumatosis or free air. A transition point is seen where there is mesenteric tethering in the lower central abdomen. Internal hernia cannot be excluded at this level. 2. Small hiatal hernia distended with fluid. 3. Left sided colostomy with nondilated colon. 4. Trace free fluid in the pelvis. Electronically signed by: Greig Pique MD 12/23/2024 05:52 PM EST RP Workstation: [id number]     Procedures   Medications Ordered in the ED  lactated ringers  bolus 1,000 mL (0 mLs Intravenous Stopped 12/23/24 1558)  ondansetron  (ZOFRAN ) injection 4 mg (4 mg Intravenous Given 12/23/24 1334)  iohexol  (OMNIPAQUE ) 350 MG/ML injection 75 mL (75 mLs Intravenous Contrast Given 12/23/24 1621)                                    Medical Decision Making Problems Addressed: Dehydration: acute illness or injury with systemic symptoms that  poses a threat to life  or bodily functions Intractable nausea and vomiting: acute illness or injury with systemic symptoms that poses a threat to life or bodily functions Multiple sclerosis: chronic illness or injury with exacerbation, progression, or side effects of treatment that poses a threat to life or bodily functions Small bowel obstruction [facility]): acute illness or injury with systemic symptoms that poses a threat to life or bodily functions  Amount and/or Complexity of Data Reviewed Independent Historian: EMS    Details: hx External Data Reviewed: notes. Labs: ordered. Decision-making details documented in ED Course. Radiology: ordered and independent interpretation performed. Decision-making details documented in ED Course. Discussion of management or test interpretation with external provider(s): Gen surg, medicine  Risk Prescription drug management. Parenteral controlled substances. Decision regarding hospitalization.   Iv ns. Continuous pulse ox and cardiac monitoring. Labs ordered/sent. Imaging ordered.   Differential diagnosis includes gastroenteritis, sbo, etc. Dispo decision including potential need for admission considered - will get labs and imaging and reassess.   Reviewed nursing notes and prior charts for additional history. External reports reviewed. Additional history from: EMS.  LR bolus, zofran  iv.   Cardiac monitor: sinus rhythm, rate 88.  Labs reviewed/interpreted by me - wbc 12, mildly elevated. Hgb 15. Chem largely unremarkable. Lipase normal.   CT reviewed/interpreted by me - high grade small bowel obstruction.   NG tube placed to LIWS.   Additional ivf.   General surgery consulted - discussed pt, ct, etc, with Dr Polly - agrees with NG, indicates admit to medicine, he will see.   Hospitalists consulted for admission.  CRITICAL CARE RE: high grade small bowel obstruction, intractable nv.  Performed by: Dywane Peruski E Eilam [place] Total critical care time:  45 minutes Critical care time was exclusive of separately billable procedures and treating other patients. Critical care was necessary to treat or prevent imminent or life-threatening deterioration. Critical care was time spent personally by me on the following activities: development of treatment plan with patient and/or surrogate as well as nursing, discussions with consultants, evaluation of patient's response to treatment, examination of patient, obtaining history from patient or surrogate, ordering and performing treatments and interventions, ordering and review of laboratory studies, ordering and review of radiographic studies, pulse oximetry and re-evaluation of patient's condition.       Final diagnoses:  Small bowel obstruction (HCC)  Multiple sclerosis  Intractable nausea and vomiting  Dehydration    ED Discharge Orders     None          Bernard Drivers, MD 12/23/24 1921  "

## 2024-12-23 NOTE — Consult Note (Signed)
 "    Nathan Marsh 1976-10-12  [id number].    Requesting MD: Bernard Chief Complaint/Reason for Consult: SBO  HPI:  48 y/o M w/ a hx of MS and previous partial colectomy, end colostomy, and splenectomy in May 2025 who presented to the ED with 2 days of nausea and intermittent nausea. He underwent CT imaging that showed concern for a high grade SBO with tethering in the central abdomen.  On exam, patient is resting in bed. NAD. He reports some nausea. He denies abdominal or bloating. WBC 12. He is AF and HDS  ROS: Review of Systems  Constitutional: Negative.   HENT: Negative.    Eyes: Negative.   Respiratory: Negative.    Cardiovascular: Negative.   Gastrointestinal:  Positive for nausea and vomiting.  Genitourinary: Negative.   Musculoskeletal: Negative.   Skin: Negative.   Neurological: Negative.   Endo/Heme/Allergies: Negative.   Psychiatric/Behavioral: Negative.      Family History  Problem Relation Age of Onset   Cancer Mother        lung    Past Medical History:  Diagnosis Date   H/O colectomy    H/O splenectomy    Multiple sclerosis    Wheelchair dependent     Past Surgical History:  Procedure Laterality Date   AMPUTATION Left 04/27/2017   Procedure: AMPUTATION DIGIT REVISION LEFT MIDDLE FINGER;  Surgeon: Balinda Rogue, MD;  Location: [facility] OR;  Service: Orthopedics;  Laterality: Left;   COLOSTOMY  05/24/2024   SKIN GRAFT SPLIT THICKNESS TRUNK  05/24/2024   SPLENECTOMY  05/24/2024   SUBTOTAL COLECTOMY Left 05/24/2024    Social History:  reports that he has been smoking cigarettes. He started smoking about 24 years ago. He has a 12.3 pack-year smoking history. He has never used smokeless tobacco. He reports current alcohol use. He reports current drug use. Drug: Marijuana.  Allergies: Allergies[1]  (Not in a hospital admission)   Physical Exam: Blood pressure (!) 143/91, pulse 83, temperature 98.3 F (36.8 C), temperature source Oral, resp. rate 17,  SpO2 97%. Gen: male, NAD Abd: soft, moderately distended, non-TTP, well healed laparotomy scar, ostomy in place with soft stool in the bag.   Results for orders placed or performed during the hospital encounter of 12/23/24 (from the past 48 hours)  CBC     Status: Abnormal   Collection Time: 12/23/24  1:10 PM  Result Value Ref Range   WBC 12.5 (H) 4.0 - 10.5 K/uL   RBC 6.41 (H) 4.22 - 5.81 MIL/uL   Hemoglobin 15.1 13.0 - 17.0 g/dL   HCT 51.5 60.9 - 47.9 %   MCV 75.5 (L) 80.0 - 100.0 fL   MCH 23.6 (L) 26.0 - 34.0 pg   MCHC 31.2 30.0 - 36.0 g/dL   RDW 80.6 (H) 88.4 - 84.4 %   Platelets 281 150 - 400 K/uL   nRBC 6.0 (H) 0.0 - 0.2 %    Comment: Performed at [facility], [address] [place], [place] [address]  Comprehensive metabolic panel with GFR     Status: Abnormal   Collection Time: 12/23/24  1:10 PM  Result Value Ref Range   Sodium 140 135 - 145 mmol/L   Potassium 4.2 3.5 - 5.1 mmol/L   Chloride 99 98 - 111 mmol/L   CO2 26 22 - 32 mmol/L   Glucose, Bld 117 (H) 70 - 99 mg/dL    Comment: Glucose reference range applies only to samples taken after fasting for at  least 8 hours.   BUN 20 6 - 20 mg/dL   Creatinine, Ser 9.08 0.61 - 1.24 mg/dL   Calcium  10.8 (H) 8.9 - 10.3 mg/dL   Total Protein 8.8 (H) 6.5 - 8.1 g/dL   Albumin 5.1 (H) 3.5 - 5.0 g/dL   AST 22 15 - 41 U/L   ALT 25 0 - 44 U/L   Alkaline Phosphatase 95 38 - 126 U/L   Total Bilirubin 0.7 0.0 - 1.2 mg/dL   GFR, Estimated >39 >39 mL/min    Comment: (NOTE) Calculated using the CKD-EPI Creatinine Equation (2021)    Anion gap 14 5 - 15    Comment: Performed at [facility], [address], [place], [place] [address]  Lipase, blood     Status: None   Collection Time: 12/23/24  1:10 PM  Result Value Ref Range   Lipase 40 11 - 51 U/L    Comment: Performed at [facility], [address] [place], [place] [address]  Resp panel by RT-PCR (RSV, Flu A&B, Covid) Anterior Nasal Swab     Status: None    Collection Time: 12/23/24  2:43 PM   Specimen: Anterior Nasal Swab  Result Value Ref Range   SARS Coronavirus 2 by RT PCR NEGATIVE NEGATIVE   Influenza A by PCR NEGATIVE NEGATIVE   Influenza B by PCR NEGATIVE NEGATIVE    Comment: (NOTE) The Xpert Xpress SARS-CoV-2/FLU/RSV plus assay is intended as an aid in the diagnosis of influenza from Nasopharyngeal swab specimens and should not be used as a sole basis for treatment. Nasal washings and aspirates are unacceptable for Xpert Xpress SARS-CoV-2/FLU/RSV testing.  Fact Sheet for Patients: bloggercourse.com  Fact Sheet for Healthcare Providers: seriousbroker.it  This test is not yet approved or cleared by the [place]  FDA and has been authorized for detection and/or diagnosis of SARS-CoV-2 by FDA under an Emergency Use Authorization (EUA). This EUA will remain in effect (meaning this test can be used) for the duration of the COVID-19 declaration under Section 564(b)(1) of the Act, 21 U.S.C. section 360bbb-3(b)(1), unless the authorization is terminated or revoked.     Resp Syncytial Virus by PCR NEGATIVE NEGATIVE    Comment: (NOTE) Fact Sheet for Patients: bloggercourse.com  Fact Sheet for Healthcare Providers: seriousbroker.it  This test is not yet approved or cleared by the [place]  FDA and has been authorized for detection and/or diagnosis of SARS-CoV-2 by FDA under an Emergency Use Authorization (EUA). This EUA will remain in effect (meaning this test can be used) for the duration of the COVID-19 declaration under Section 564(b)(1) of the Act, 21 U.S.C. section 360bbb-3(b)(1), unless the authorization is terminated or revoked.  Performed at [facility], [address] [place], [place] [address]    CT ABDOMEN PELVIS W CONTRAST Result Date: 12/23/2024 IMPRESSION: 1. High grade small bowel obstruction. No  pneumatosis or free air. A transition point is seen where there is mesenteric tethering in the lower central abdomen. Internal hernia cannot be excluded at this level. 2. Small hiatal hernia distended with fluid. 3. Left sided colostomy with nondilated colon. 4. Trace free fluid in the pelvis. Electronically signed by: Greig Pique MD 12/23/2024 05:52 PM EST RP Workstation: [id number]    Assessment/Plan 48 y/o M with MS presenting with SBO  - No indication for urgent surgical intervention. His CT shows a high grade obstruction but his exam is reassuring, there is no signs of bowel compromise, and he has stool  in his bag.  - Will add a lactate - NPO - Place NGT and set to suction  - Monitor abdominal exam - Surgery will follow up in the AM and likely plan for small bowel protocol after decompression   Cordella DELENA Polly Marlis Cheron Surgery 12/23/2024, 7:15 PM Please see Amion for pager number during day hours 7:00am-4:30pm or 7:00am -11:30am on weekends     [1] No Known Allergies  "

## 2024-12-23 NOTE — ED Provider Triage Note (Signed)
 Emergency Medicine Provider Triage Evaluation Note  Nathan Marsh , a 48 y.o. male  was evaluated in triage.  Pt complains of nausea/vomiting. Pt unsure of color of emesis. Denies abd pain. Has ostomy w output in bag. Pt v poor historian.   Review of Systems  Positive: Nv.  Negative: Fevers, abd pain. No headache. No chest pain.   Physical Exam  BP (!) 132/99   Pulse 93   Temp 98 F (36.7 C) (Oral)   Resp 16   SpO2 98%  Gen:   Awake, no distress   Resp:  Normal effort cta bil.  Abd:  Soft, mildly distended. Non tender. Ostomy w output.   Medical Decision Making  Medically screening exam initiated at 12:45 PM.  Appropriate orders placed.  Nathan Marsh was informed that the remainder of the evaluation will be completed by another provider, this initial triage assessment does not replace that evaluation, and the importance of remaining in the ED until their evaluation is complete.  Labs and imaging ordered. LR bolus. Zofran  iv.    Nathan Drivers, MD 12/23/24 1247

## 2024-12-23 NOTE — H&P (Signed)
 " History and Physical    Nathan Marsh FMW:[id number] DOB: 09-12-1976 DOA: 12/23/2024  PCP: [name], PA  Patient coming from: Home  Chief Complaint: Vomiting  HPI: Nathan Marsh is a 48 y.o. male with medical history significant of active secondary progressive MS, neurogenic bladder, history of left colectomy with creation of end colostomy and splenectomy secondary to mucosal ischemia and necrosis in May 2025, marijuana and tobacco abuse presenting with complaints of nausea and vomiting x 2 days.  Denies abdominal pain.  Reports having normal ostomy output.  No other complaints.  Denies shortness of breath or chest pain.  ED Course: Vital signs stable.  Labs notable for WBC count 12.5, normal lipase and LFTs, COVID and flu negative, UA not suggestive of infection.  CT showing high-grade SBO.  General Surgery felt that there was no indication for urgent surgical intervention.  No signs of bowel compromise and patient had stool in his ostomy bag.  Recommended checking lactate, keeping n.p.o., and placing NG tube.  NG tube insertion unsuccessful despite 3 attempts by nursing staff in the ED.  Patient was given morphine , Zofran , and 1 L LR.  Review of Systems:  Review of Systems  All other systems reviewed and are negative.   Past Medical History:  Diagnosis Date   H/O colectomy    H/O splenectomy    Multiple sclerosis    Wheelchair dependent     Past Surgical History:  Procedure Laterality Date   AMPUTATION Left 04/27/2017   Procedure: AMPUTATION DIGIT REVISION LEFT MIDDLE FINGER;  Surgeon: Balinda Rogue, MD;  Location: [facility] OR;  Service: Orthopedics;  Laterality: Left;   COLOSTOMY  05/24/2024   SKIN GRAFT SPLIT THICKNESS TRUNK  05/24/2024   SPLENECTOMY  05/24/2024   SUBTOTAL COLECTOMY Left 05/24/2024     reports that he has been smoking cigarettes. He started smoking about 24 years ago. He has a 12.3 pack-year smoking history. He has never used smokeless tobacco. He  reports current alcohol use. He reports current drug use. Drug: Marijuana.  Allergies[1]  Family History  Problem Relation Age of Onset   Cancer Mother        lung    Prior to Admission medications  Medication Sig Start Date End Date Taking? Authorizing Provider  baclofen  (LIORESAL ) 10 MG tablet Take 10 mg by mouth 3 (three) times daily. 03/07/24   [provider]  oxybutynin  (DITROPAN ) 5 MG tablet Take 1 tablet (5 mg total) by mouth 2 (two) times daily. 06/21/23   [name], PA    Physical Exam: Vitals:   12/23/24 1600 12/23/24 1602 12/23/24 1910 12/23/24 1931  BP:  (!) 149/99 (!) 143/91   Pulse:  87 83   Resp:  16 17   Temp: 98.2 F (36.8 C)  98.3 F (36.8 C)   TempSrc: Oral  Oral   SpO2:  97% 97% 97%    Physical Exam Vitals reviewed.  Constitutional:      General: He is not in acute distress. HENT:     Head: Normocephalic and atraumatic.  Eyes:     Extraocular Movements: Extraocular movements intact.  Cardiovascular:     Rate and Rhythm: Normal rate and regular rhythm.     Heart sounds: Normal heart sounds.  Pulmonary:     Effort: Pulmonary effort is normal. No respiratory distress.     Breath sounds: Normal breath sounds. No stridor. No wheezing, rhonchi or rales.  Abdominal:     General: There is distension.  Palpations: Abdomen is soft.     Tenderness: There is no abdominal tenderness. There is no guarding.     Comments: Hypoactive bowel sounds  Musculoskeletal:     Cervical back: Normal range of motion.     Right lower leg: No edema.     Left lower leg: No edema.  Skin:    General: Skin is warm and dry.  Neurological:     Mental Status: He is alert and oriented to person, place, and time.     Labs on Admission: I have personally reviewed following labs and imaging studies  CBC: Recent Labs  Lab 12/23/24 1310  WBC 12.5*  HGB 15.1  HCT 48.4  MCV 75.5*  PLT 281   Basic Metabolic Panel: Recent Labs  Lab 12/23/24 1310  NA  140  K 4.2  CL 99  CO2 26  GLUCOSE 117*  BUN 20  CREATININE 0.91  CALCIUM  10.8*   GFR: Estimated Creatinine Clearance: 74.3 mL/min (by C-G formula based on SCr of 0.91 mg/dL). Liver Function Tests: Recent Labs  Lab 12/23/24 1310  AST 22  ALT 25  ALKPHOS 95  BILITOT 0.7  PROT 8.8*  ALBUMIN 5.1*   Recent Labs  Lab 12/23/24 1310  LIPASE 40   No results for input(s): AMMONIA in the last 168 hours. Coagulation Profile: No results for input(s): INR, PROTIME in the last 168 hours. Cardiac Enzymes: No results for input(s): CKTOTAL, CKMB, CKMBINDEX, TROPONINI in the last 168 hours. BNP (last 3 results) No results for input(s): PROBNP in the last 8760 hours. HbA1C: No results for input(s): HGBA1C in the last 72 hours. CBG: No results for input(s): GLUCAP in the last 168 hours. Lipid Profile: No results for input(s): CHOL, HDL, LDLCALC, TRIG, CHOLHDL, LDLDIRECT in the last 72 hours. Thyroid Function Tests: No results for input(s): TSH, T4TOTAL, FREET4, T3FREE, THYROIDAB in the last 72 hours. Anemia Panel: No results for input(s): VITAMINB12, FOLATE, FERRITIN, TIBC, IRON, RETICCTPCT in the last 72 hours. Urine analysis:    Component Value Date/Time   COLORURINE YELLOW 12/23/2024 1856   APPEARANCEUR TURBID (A) 12/23/2024 1856   LABSPEC 1.010 12/23/2024 1856   PHURINE 7.0 12/23/2024 1856   GLUCOSEU NEGATIVE 12/23/2024 1856   HGBUR NEGATIVE 12/23/2024 1856   BILIRUBINUR NEGATIVE 12/23/2024 1856   KETONESUR NEGATIVE 12/23/2024 1856   PROTEINUR 100 (A) 12/23/2024 1856   UROBILINOGEN 0.2 06/06/2008 0856   NITRITE NEGATIVE 12/23/2024 1856   LEUKOCYTESUR NEGATIVE 12/23/2024 1856    Radiological Exams on Admission: CT ABDOMEN PELVIS W CONTRAST Result Date: 12/23/2024 IMPRESSION: 1. High grade small bowel obstruction. No pneumatosis or free air. A transition point is seen where there is mesenteric tethering in the lower  central abdomen. Internal hernia cannot be excluded at this level. 2. Small hiatal hernia distended with fluid. 3. Left sided colostomy with nondilated colon. 4. Trace free fluid in the pelvis. Electronically signed by: Greig Pique MD 12/23/2024 05:52 PM EST RP Workstation: [id number]    Assessment and Plan  High-grade SBO General Surgery felt that there was no indication for urgent surgical intervention.  No signs of bowel compromise and patient had stool in his ostomy bag.  Recommended checking lactate, keeping n.p.o., and placing NG tube.  NG tube insertion unsuccessful despite 3 attempts by nursing staff in the ED. General surgery notified.  Keep n.p.o., IV fluid hydration.  Patient is not endorsing any abdominal pain.  Not actively vomiting at this time.  Antiemetic as needed (EKG ordered to check  QT interval).  Mild leukocytosis Likely reactive.  No infectious signs or symptoms.  Monitor CBC.  MS History of neurogenic bladder Followed by neurology and receives Tysabri .  Home meds baclofen  and oxybutynin  held at this time due to n.p.o. status.  Patient states he is able to urinate on his own and does not require catheterization.  Continue bladder scans every 6 hours.  DVT prophylaxis: SCDs Code Status: DNR/DNI (discussed with the patient) Level of care: Med-Surg Admission status: It is my clinical opinion that admission to INPATIENT is reasonable and necessary because of the expectation that this patient will require hospital care that crosses at least 2 midnights to treat this condition based on the medical complexity of the problems presented.  Given the aforementioned information, the predictability of an adverse outcome is felt to be significant.  Editha Ram MD [facility]  If 7PM-7AM, please contact night-coverage [email or web address]  12/23/2024, 9:38 PM       [1] No Known Allergies  "

## 2024-12-24 ENCOUNTER — Inpatient Hospital Stay (HOSPITAL_COMMUNITY)

## 2024-12-24 DIAGNOSIS — K56609 Unspecified intestinal obstruction, unspecified as to partial versus complete obstruction: Secondary | ICD-10-CM | POA: Diagnosis not present

## 2024-12-24 LAB — BASIC METABOLIC PANEL WITH GFR
Anion gap: 12 (ref 5–15)
BUN: 22 mg/dL — ABNORMAL HIGH (ref 6–20)
CO2: 25 mmol/L (ref 22–32)
Calcium: 9 mg/dL (ref 8.9–10.3)
Chloride: 102 mmol/L (ref 98–111)
Creatinine, Ser: 1.07 mg/dL (ref 0.61–1.24)
GFR, Estimated: >60 mL/min
Glucose, Bld: 114 mg/dL — ABNORMAL HIGH (ref 70–99)
Potassium: 4.4 mmol/L (ref 3.5–5.1)
Sodium: 139 mmol/L (ref 135–145)

## 2024-12-24 LAB — CBC
HCT: 43.8 % (ref 39.0–52.0)
Hemoglobin: 13.4 g/dL (ref 13.0–17.0)
MCH: 23.5 pg — ABNORMAL LOW (ref 26.0–34.0)
MCHC: 30.6 g/dL (ref 30.0–36.0)
MCV: 76.8 fL — ABNORMAL LOW (ref 80.0–100.0)
Platelets: 246 K/uL (ref 150–400)
RBC: 5.7 MIL/uL (ref 4.22–5.81)
RDW: 18.9 % — ABNORMAL HIGH (ref 11.5–15.5)
WBC: 19.6 K/uL — ABNORMAL HIGH (ref 4.0–10.5)
nRBC: 3.2 % — ABNORMAL HIGH (ref 0.0–0.2)

## 2024-12-24 LAB — LACTIC ACID, PLASMA: Lactic Acid, Venous: 2.3 mmol/L (ref 0.5–1.9)

## 2024-12-24 LAB — TROPONIN T, HIGH SENSITIVITY
Troponin T High Sensitivity: 49 ng/L — ABNORMAL HIGH (ref 0–19)
Troponin T High Sensitivity: 54 ng/L — ABNORMAL HIGH (ref 0–19)

## 2024-12-24 LAB — I-STAT CG4 LACTIC ACID, ED: Lactic Acid, Venous: 1.1 mmol/L (ref 0.5–1.9)

## 2024-12-24 LAB — HIV ANTIBODY (ROUTINE TESTING W REFLEX): HIV Screen 4th Generation wRfx: NONREACTIVE

## 2024-12-24 MED ORDER — LIDOCAINE HCL URETHRAL/MUCOSAL 2 % EX GEL: 1.0000 | Freq: Once | CUTANEOUS | Status: DC

## 2024-12-24 MED ORDER — SODIUM CHLORIDE 0.9 % IV BOLUS: 1000.0000 mL | Freq: Once | INTRAVENOUS | Status: DC

## 2024-12-24 MED ORDER — KCL IN DEXTROSE-NACL 20-5-0.9 MEQ/L-%-% IV SOLN
INTRAVENOUS | Status: DC
  Filled 2024-12-24 (×2): qty 1000

## 2024-12-24 MED ORDER — MORPHINE SULFATE (PF) 4 MG/ML IV SOLN: 4.0000 mg | INTRAVENOUS | Status: DC | PRN

## 2024-12-24 MED ORDER — PHENOL 1.4 % MT LIQD: 1.0000 | OROMUCOSAL | Status: DC | PRN

## 2024-12-24 MED ORDER — SODIUM CHLORIDE 0.9 % IV BOLUS
1000.0000 mL | Freq: Once | INTRAVENOUS | Status: AC
  Administered 2024-12-24: 1000 mL via INTRAVENOUS

## 2024-12-24 MED ORDER — NICOTINE 7 MG/24HR TD PT24
7.0000 mg | MEDICATED_PATCH | Freq: Every day | TRANSDERMAL | Status: DC
  Administered 2024-12-24 – 2024-12-27 (×4): 7 mg via TRANSDERMAL
  Filled 2024-12-24 (×3): qty 1

## 2024-12-24 NOTE — ED Notes (Signed)
 Patient vomiting again. Patient cleaned up.

## 2024-12-24 NOTE — Progress Notes (Signed)
 "      Subjective: CC: Patient reports his abdomen feels back to normal. Some nausea. No vomiting. Stool in ostomy bag. NGT unable to be placed.   S/p 3L IVF and on mIVF at 125ml/hr He reports no UOP. No UOP in ext foley Afebrile. Tachycardia resolved. No hypotension.  WBC 19.6 from 12.5.  Lactic 2.3 from 2.2.  Cr, BUN also up slightly.   Objective: Vital signs in last 24 hours: Temp:  [98 F (36.7 C)-98.9 F (37.2 C)] 98.9 F (37.2 C) (12/28 0237) Pulse Rate:  [83-104] 90 (12/28 0237) Resp:  [12-17] 16 (12/28 0237) BP: (132-149)/(91-99) 142/98 (12/28 0237) SpO2:  [97 %-100 %] 100 % (12/28 0237)    Intake/Output from previous day: 12/27 0701 - 12/28 0700 In: 1000 [IV Piggyback:1000] Out: -  Intake/Output this shift: No intake/output data recorded.  PE: Gen:  Alert, NAD, pleasant Abd: Soft, moderate distension, NT, stoma view limited 2/2 stool in ostomy bag but able to see this appears pink and viable, liquid stool in ostomy bag, no air.   Lab Results:  Recent Labs    12/23/24 1310 12/24/24 0222  WBC 12.5* 19.6*  HGB 15.1 13.4  HCT 48.4 43.8  PLT 281 246   BMET Recent Labs    12/23/24 1310 12/24/24 0222  NA 140 139  K 4.2 4.4  CL 99 102  CO2 26 25  GLUCOSE 117* 114*  BUN 20 22*  CREATININE 0.91 1.07  CALCIUM  10.8* 9.0   PT/INR No results for input(s): LABPROT, INR in the last 72 hours. CMP     Component Value Date/Time   NA 139 12/24/2024 0222   K 4.4 12/24/2024 0222   CL 102 12/24/2024 0222   CO2 25 12/24/2024 0222   GLUCOSE 114 (H) 12/24/2024 0222   BUN 22 (H) 12/24/2024 0222   CREATININE 1.07 12/24/2024 0222   CALCIUM  9.0 12/24/2024 0222   PROT 8.8 (H) 12/23/2024 1310   ALBUMIN 5.1 (H) 12/23/2024 1310   AST 22 12/23/2024 1310   ALT 25 12/23/2024 1310   ALKPHOS 95 12/23/2024 1310   BILITOT 0.7 12/23/2024 1310   GFRNONAA >60 12/24/2024 0222   GFRAA  06/06/2008 0856    >60        The eGFR has been calculated using the MDRD  equation. This calculation has not been validated in all clinical   Lipase     Component Value Date/Time   LIPASE 40 12/23/2024 1310    Studies/Results: CT ABDOMEN PELVIS W CONTRAST Result Date: 12/23/2024 IMPRESSION: 1. High grade small bowel obstruction. No pneumatosis or free air. A transition point is seen where there is mesenteric tethering in the lower central abdomen. Internal hernia cannot be excluded at this level. 2. Small hiatal hernia distended with fluid. 3. Left sided colostomy with nondilated colon. 4. Trace free fluid in the pelvis. Electronically signed by: Greig Pique MD 12/23/2024 05:52 PM EST RP Workstation: [id number]    Anti-infectives: Anti-infectives (From admission, onward)    None        Assessment/Plan SBO - CT w/ High grade small bowel obstruction. A transition point is seen where there is mesenteric tethering in the lower central abdomen with ? of internal hernia. No pneumatosis or free air. Trace free fluid in pelvis.  - HDS without fever, tachycardia or hypotension. No peritonitis on exam and actually NT with stool in ostomy bag. However, I am concerned about rising WBC and him not clearing his lactate. Will discuss with  TRH, give another IVF bolus and trend lactate. Follow UOP. Keep NPO and place NGT for decompression (patient agreeable for another attempt). No current indication for emergency surgery but needs a close eye and may need surgery in near future if not improving.  - Consider SBO protocol later today after decompression. - Keep K >=4, Phos >= 3, Mg >= 2 and mobilize for bowel function. Okay to clamp NGT for mobilization.  - We will follow with you.  FEN - NPO, NGT to LIWS, IVF per TRH VTE - SCDs, okay for chem ppx from a general surgery standpoint ID - None Foley - Ext, bladder scan, strict I/O  I reviewed nursing notes, hospitalist notes, last 24 h vitals and pain scores, last 48 h intake and output, last 24 h labs and trends, and  last 24 h imaging results.    LOS: 1 day    Ozell CHRISTELLA Shaper, [facility] 12/24/2024, 7:59 AM Please see Amion for pager number during day hours 7:00am-4:30pm  "

## 2024-12-24 NOTE — ED Notes (Signed)
 Patient is resting comfortably.

## 2024-12-24 NOTE — Progress Notes (Signed)
" °  Progress Note   Patient: Nathan Marsh FMW:[id number] DOB: February 26, 1976 DOA: 12/23/2024     1 DOS: the patient was seen and examined on 12/24/2024 at 7:30AM      Brief hospital course: 48 y.o. 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       Assessment and Plan: SBO Failed placement of NG Lactate elevated, but no pain on exam.  No CT findings of bowel ischemia.  VSS.  - NPO - IVF - Serial exams - Consult Genersal Surgery  Multiple sclerosis Neurogenic bladder History colostomy On Tysabri  - Resume baclofen  and oxybutynin  when able to take PO  Elevated troponin Low level, not dynamic.   ECG normal, no chest symptoms.  Probably nonishcemic myocardial injury not ischemia.  Unclear cause.  - Obtain echo        Subjective: Patient says he is doing okay.  Oriented to self, hospital.  No vomiting in the last few hours.     Physical Exam: BP (!) 142/98 (BP Location: Left Arm)   Pulse 90   Temp 98.9 F (37.2 C) (Oral)   Resp 16   SpO2 100%   General: Pt is awake, very low vision, notable psychomotor slowing, dry oral mucosa.   Cardiovascular: Tachy, regular, nl S1-S2, no murmurs appreciated.   No LE edema.   Respiratory: Normal respiratory rate and rhythm.  CTAB without rales or wheezes. Abdominal: Abdomen with guarding throughtout, I think from spasticity, no indication of pain anywhere.  HSM not palpable.   Neuro/Psych: Incresed tone thoughout.  Slowed responses.  Oriented to slef, hospital,     Data Reviewed: Discussed with General Surgery team BMP unremarkable CBC shows worse WBC Troponin low and flat ECG personally reviewed, normal    Family Communication:     Disposition: Status is: Inpatient         Author: Lonni SHAUNNA Dalton, MD 12/24/2024 9:21 AM  For on call review [email or web address].    "

## 2024-12-24 NOTE — Care Plan (Signed)
 Reviewed bed status with nursing. Appropriate for 5C.  Very low clinical suspicion for cardiac ischemia.

## 2024-12-24 NOTE — Care Plan (Signed)
Spoke with sister by phone.

## 2024-12-24 NOTE — ED Notes (Signed)
" °  33F NGT placed by TRN at this time. Marked and taped at 58cm in the right nare. Pt had vomited just prior to TRN walking in room (prior to NGT placement), but tolerated procedure very well and resting comfortably at this time.  Connected to LWS at 100 mmHg.  Light brownish coffee ground tinge in canister. Marked at at this time. KUB ordered for placement verification.   Last imported Vital Signs BP (!) 126/94 (BP Location: Left Arm)   Pulse 87   Temp 98.8 F (37.1 C) (Oral)   Resp 13   SpO2 98%   Trending CBC Recent Labs    12/23/24 1310 12/24/24 0222  WBC 12.5* 19.6*  HGB 15.1 13.4  HCT 48.4 43.8  PLT 281 246    Trending Coag's No results for input(s): APTT, INR in the last 72 hours.  Trending BMET Recent Labs    12/23/24 1310 12/24/24 0222  NA 140 139  K 4.2 4.4  CL 99 102  CO2 26 25  BUN 20 22*  CREATININE 0.91 1.07  GLUCOSE 117* 114*   Md Smola W  Trauma Response RN  Please call TRN at [phone] for further assistance. "

## 2024-12-24 NOTE — Hospital Course (Signed)
 48 y.o. 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

## 2024-12-24 NOTE — ED Notes (Signed)
 Oral care performed.

## 2024-12-25 ENCOUNTER — Inpatient Hospital Stay (HOSPITAL_COMMUNITY)

## 2024-12-25 DIAGNOSIS — I371 Nonrheumatic pulmonary valve insufficiency: Secondary | ICD-10-CM | POA: Diagnosis not present

## 2024-12-25 DIAGNOSIS — K56609 Unspecified intestinal obstruction, unspecified as to partial versus complete obstruction: Secondary | ICD-10-CM | POA: Diagnosis not present

## 2024-12-25 DIAGNOSIS — R7989 Other specified abnormal findings of blood chemistry: Secondary | ICD-10-CM | POA: Diagnosis not present

## 2024-12-25 LAB — COMPREHENSIVE METABOLIC PANEL WITH GFR
ALT: 14 U/L (ref 0–44)
AST: 17 U/L (ref 15–41)
Albumin: 3.7 g/dL (ref 3.5–5.0)
Alkaline Phosphatase: 55 U/L (ref 38–126)
Anion gap: 8 (ref 5–15)
BUN: 17 mg/dL (ref 6–20)
CO2: 29 mmol/L (ref 22–32)
Calcium: 8.7 mg/dL — ABNORMAL LOW (ref 8.9–10.3)
Chloride: 108 mmol/L (ref 98–111)
Creatinine, Ser: 0.86 mg/dL (ref 0.61–1.24)
GFR, Estimated: >60 mL/min
Glucose, Bld: 116 mg/dL — ABNORMAL HIGH (ref 70–99)
Potassium: 3.5 mmol/L (ref 3.5–5.1)
Sodium: 145 mmol/L (ref 135–145)
Total Bilirubin: 0.7 mg/dL (ref 0.0–1.2)
Total Protein: 5.9 g/dL — ABNORMAL LOW (ref 6.5–8.1)

## 2024-12-25 LAB — CBC
HCT: 34.1 % — ABNORMAL LOW (ref 39.0–52.0)
Hemoglobin: 10.7 g/dL — ABNORMAL LOW (ref 13.0–17.0)
MCH: 23.8 pg — ABNORMAL LOW (ref 26.0–34.0)
MCHC: 31.4 g/dL (ref 30.0–36.0)
MCV: 75.8 fL — ABNORMAL LOW (ref 80.0–100.0)
Platelets: 224 K/uL (ref 150–400)
RBC: 4.5 MIL/uL (ref 4.22–5.81)
RDW: 17.7 % — ABNORMAL HIGH (ref 11.5–15.5)
WBC: 14.3 K/uL — ABNORMAL HIGH (ref 4.0–10.5)
nRBC: 3.6 % — ABNORMAL HIGH (ref 0.0–0.2)

## 2024-12-25 LAB — ECHOCARDIOGRAM COMPLETE
Area-P 1/2: 4.31 cm2
Calc EF: 70.4 %
Height: 66.5 in
MV M vel: 2.59 m/s
MV Peak grad: 26.8 mmHg
S' Lateral: 2.8 cm
Single Plane A2C EF: 68 %
Single Plane A4C EF: 71.9 %
Weight: 2215.18 [oz_av]

## 2024-12-25 MED ORDER — BOOST / RESOURCE BREEZE PO LIQD CUSTOM
1.0000 | Freq: Three times a day (TID) | ORAL | Status: DC
  Administered 2024-12-26 – 2024-12-27 (×4): 1 via ORAL

## 2024-12-25 MED ORDER — ENOXAPARIN SODIUM 40 MG/0.4ML IJ SOSY
40.0000 mg | PREFILLED_SYRINGE | INTRAMUSCULAR | Status: DC
  Administered 2024-12-25 – 2024-12-27 (×3): 40 mg via SUBCUTANEOUS
  Filled 2024-12-25 (×3): qty 0.4

## 2024-12-25 MED ORDER — DIPHENHYDRAMINE HCL 25 MG PO CAPS
25.0000 mg | ORAL_CAPSULE | Freq: Every evening | ORAL | Status: DC | PRN
  Administered 2024-12-27: 25 mg via ORAL
  Filled 2024-12-25: qty 1

## 2024-12-25 MED ORDER — BACLOFEN 10 MG PO TABS
10.0000 mg | ORAL_TABLET | Freq: Three times a day (TID) | ORAL | Status: DC
  Administered 2024-12-25 – 2024-12-27 (×9): 10 mg via ORAL
  Filled 2024-12-25 (×9): qty 1

## 2024-12-25 MED ORDER — OXYBUTYNIN CHLORIDE 5 MG PO TABS
5.0000 mg | ORAL_TABLET | Freq: Two times a day (BID) | ORAL | Status: DC
  Administered 2024-12-25 – 2024-12-26 (×3): 5 mg via ORAL
  Filled 2024-12-25 (×5): qty 1
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
## 2024-12-25 NOTE — Progress Notes (Signed)
" °  Progress Note   Patient: Nathan Marsh FMW:[id number] DOB: 03/25/1976 DOA: 12/23/2024     2 DOS: the patient was seen and examined on 12/25/2024 at 1:40 PM      Brief hospital course: 48 y.o. 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       Assessment and Plan: SBO Admitted and monitored with serial abdominal exams.  These remain benign.  NG tube was placed by trauma surgery, and in the last 24 hours, his bowels have softened, he started to have output from his ostomy, and he has no further vomiting.  NG tube removed by general surgery this morning, unclear as - Advance diet as tolerated per general surgery - Stop IV fluids   Multiple sclerosis Neurogenic bladder History colostomy On Tysabri  - Resume home baclofen  and oxybutynin   Elevated troponin No new symptoms.  Low level, not dynamic.   ECG with minimal ST depressions, no chest symptoms.  Probably nonishcemic myocardial injury not ischemia.  Unclear cause.  - Follow-up echo        Subjective: Patient says he is doing okay.  Oriented to self, hospital.  No vomiting in the last few hours.     Physical Exam: BP 127/78 (BP Location: Right Arm)   Pulse 77   Temp 98.6 F (37 C) (Oral)   Resp 18   Ht 5' 6.5 (1.689 m)   Wt 62.8 kg   SpO2 91%   BMI 22.01 kg/m   General: Pt is awake, very low vision, notable psychomotor slowing unchanged, dry oral mucosa.   Cardiovascular: RRR, no murmurs, no peripheral edema   Respiratory: Normal respiratory rate and rhythm.  CTAB without rales or wheezes. Abdominal: Abdomen has some involuntary guarding throughout, seems to be from spasticity not pain. Neuro/Psych: Incresed tone thoughout.  Slowed responses.  Oriented to slef, hospital,     Data Reviewed: Basic metabolic panel shows normal electrolytes and renal function Lactic acid cleared White blood cell count improving Mild anemia Platelets  normal   Family Communication: Caregiver by phone    Disposition: Status is: Inpatient         Author: Lonni SHAUNNA Dalton, MD 12/25/2024 3:26 PM  For on call review [email or web address].    "

## 2024-12-25 NOTE — Progress Notes (Signed)
"   ° °  Subjective/Chief Complaint: Thirsty Feels abdomen is back to normal Large amount of liquid stool in ostomy bag NG to ILWS - minimal output   Objective: Vital signs in last 24 hours: Temp:  [98.1 F (36.7 C)-99 F (37.2 C)] 98.6 F (37 C) (12/29 0742) Pulse Rate:  [67-105] 77 (12/29 0742) Resp:  [12-18] 18 (12/29 0742) BP: (126-152)/(78-104) 127/78 (12/29 0742) SpO2:  [91 %-98 %] 91 % (12/29 0742) Weight:  [62.8 kg] 62.8 kg (12/29 0106) Last BM Date : 12/25/24  Intake/Output from previous day: 12/28 0701 - 12/29 0700 In: -  Out: 1000 [Urine:200; Emesis/NG output:200; Stool:600] Intake/Output this shift: No intake/output data recorded. WDWN in NAD Abd - soft, non-distended; non-tender Colostomy viable with large amount of brown liquid stool in bag  Lab Results:  Recent Labs    12/24/24 0222 12/25/24 0326  WBC 19.6* 14.3*  HGB 13.4 10.7*  HCT 43.8 34.1*  PLT 246 224   BMET Recent Labs    12/24/24 0222 12/25/24 0326  NA 139 145  K 4.4 3.5  CL 102 108  CO2 25 29  GLUCOSE 114* 116*  BUN 22* 17  CREATININE 1.07 0.86  CALCIUM  9.0 8.7*    Studies/Results: DG Abd 1 View Result Date: 12/24/2024 CLINICAL DATA:  NG tube placement. EXAM: ABDOMEN - 1 VIEW COMPARISON:  None Available. FINDINGS: Dilated loops of small bowel are noted in the upper abdomen measuring up to 6.1 cm in the mid right abdomen. There is gaseous distention of the stomach. An ostomy is noted in the left lower quadrant. An enteric tube terminates in the stomach and appears appropriate in position. No radio-opaque calculi or other acute radiographic abnormality are seen. IMPRESSION: 1. Markedly distended small bowel, compatible with known small-bowel obstruction. 2. NG tube appears appropriate in position. Electronically Signed   By: Leita Birmingham M.D.   On: 12/24/2024 14:31   CT ABDOMEN PELVIS W CONTRAST Result Date: 12/23/2024 IMPRESSION: 1. High grade small bowel obstruction. No pneumatosis  or free air. A transition point is seen where there is mesenteric tethering in the lower central abdomen. Internal hernia cannot be excluded at this level. 2. Small hiatal hernia distended with fluid. 3. Left sided colostomy with nondilated colon. 4. Trace free fluid in the pelvis. Electronically signed by: Greig Pique MD 12/23/2024 05:52 PM EST RP Workstation: [id number]    Anti-infectives: Anti-infectives (From admission, onward)    None       Assessment/Plan: SBO - CT w/ High grade small bowel obstruction. A transition point is seen where there is mesenteric tethering in the lower central abdomen with ? of internal hernia. No pneumatosis or free air. Trace free fluid in pelvis.  - SBO appears to have resolved with large stool output and decreased distention - Consider SBO protocol later today after decompression. - Keep K >=4, Phos >= 3, Mg >= 2 and mobilize for bowel function.  - D/C NG tube; clear liquids   FEN - Clear liquids, NGT discontinue, IVF per TRH VTE - SCDs, okay for chem ppx from a general surgery standpoint ID - None Foley - Ext, bladder scan, strict I/O    LOS: 2 days    Donnice MARLA Lima 12/25/2024  "

## 2024-12-25 NOTE — Progress Notes (Signed)
" °  Echocardiogram 2D Echocardiogram has been performed.  Koleen KANDICE Popper, RDCS 12/25/2024, 10:23 AM "

## 2024-12-25 NOTE — Plan of Care (Signed)

## 2024-12-25 NOTE — Plan of Care (Signed)
  Problem: Education: Goal: Knowledge of General Education information will improve Description: Including pain rating scale, medication(s)/side effects and non-pharmacologic comfort measures Outcome: Progressing   Problem: Elimination: Goal: Will not experience complications related to bowel motility Outcome: Progressing   Problem: Pain Managment: Goal: General experience of comfort will improve and/or be controlled Outcome: Progressing

## 2024-12-26 ENCOUNTER — Inpatient Hospital Stay (HOSPITAL_COMMUNITY)

## 2024-12-26 DIAGNOSIS — K56609 Unspecified intestinal obstruction, unspecified as to partial versus complete obstruction: Secondary | ICD-10-CM | POA: Diagnosis not present

## 2024-12-26 LAB — BASIC METABOLIC PANEL WITH GFR
Anion gap: 11 (ref 5–15)
BUN: 15 mg/dL (ref 6–20)
CO2: 26 mmol/L (ref 22–32)
Calcium: 9.2 mg/dL (ref 8.9–10.3)
Chloride: 107 mmol/L (ref 98–111)
Creatinine, Ser: 0.79 mg/dL (ref 0.61–1.24)
GFR, Estimated: >60 mL/min
Glucose, Bld: 100 mg/dL — ABNORMAL HIGH (ref 70–99)
Potassium: 3.6 mmol/L (ref 3.5–5.1)
Sodium: 143 mmol/L (ref 135–145)

## 2024-12-26 LAB — CBC
HCT: 37.7 % — ABNORMAL LOW (ref 39.0–52.0)
Hemoglobin: 11.8 g/dL — ABNORMAL LOW (ref 13.0–17.0)
MCH: 23.9 pg — ABNORMAL LOW (ref 26.0–34.0)
MCHC: 31.3 g/dL (ref 30.0–36.0)
MCV: 76.3 fL — ABNORMAL LOW (ref 80.0–100.0)
Platelets: 218 K/uL (ref 150–400)
RBC: 4.94 MIL/uL (ref 4.22–5.81)
RDW: 17.7 % — ABNORMAL HIGH (ref 11.5–15.5)
WBC: 13.3 K/uL — ABNORMAL HIGH (ref 4.0–10.5)
nRBC: 5.5 % — ABNORMAL HIGH (ref 0.0–0.2)

## 2024-12-26 MED ORDER — METOCLOPRAMIDE HCL 5 MG/ML IJ SOLN
10.0000 mg | Freq: Four times a day (QID) | INTRAMUSCULAR | Status: DC
  Administered 2024-12-26 – 2024-12-28 (×8): 10 mg via INTRAVENOUS
  Filled 2024-12-26 (×8): qty 2

## 2024-12-26 MED ORDER — KCL IN DEXTROSE-NACL 20-5-0.9 MEQ/L-%-% IV SOLN
INTRAVENOUS | Status: AC
  Filled 2024-12-26 (×2): qty 1000

## 2024-12-26 NOTE — Progress Notes (Signed)
 PT Cancellation Note  Patient Details Name: Nathan Marsh MRN: [id number] DOB: 17-Jun-1976   Cancelled Treatment:    Reason Eval/Treat Not Completed: PT screened, no needs identified, will sign off (Pt has 24 hour care at home and a hoyer lift is used to mobilize pt. No PT needs.)Patient is at his baseline: bedlevel up to total A for ADL and hoyer to/from bed.    Stephane JULIANNA Bevel 12/26/2024, 11:34 AM Vernita Tague M,PT Acute Rehab Services [phone]

## 2024-12-26 NOTE — Progress Notes (Signed)
" ° °  Brief Progress Note   _____________________________________________________________________________________________________________  Patient Name: Nathan Marsh Patient DOB: [birth year] Date: @TODAY @      Data: pt has a HX of  progressive MS, neurogenic bladder, left colectomy with creation of end colostomy and s;lenectomy secondary to mucosal ischemia and necrosis in May 2025, marijuana and tobacco abuse with c/o of N/vx2 days     Action: no orders for d/c home.   Has established HH from before being hospitalized         _____________________________________________________________________________________________________________  [name] Please contact us  directly via secure chat (search for [facility]) or by calling us  at [phone] [facility]).  "

## 2024-12-26 NOTE — Plan of Care (Signed)

## 2024-12-26 NOTE — Telephone Encounter (Signed)
 Spoke to Cutter, told her copy of [place] paperwork was in pt's chart now. I will mail copy to pt. [name] verbalized understanding. Mailed

## 2024-12-26 NOTE — Evaluation (Signed)
 Occupational Therapy Evaluation Patient Details Name: Nathan Marsh MRN: [id number] DOB: [birth year] Today's Date: 12/26/2024   History of Present Illness   48 y.o. M with secondary progressive MS on Tysabri , WC bound, hx colostomy, smoking, hx splenectomy who presented with SBO     Clinical Impressions Patient admitted for the diagnosis above.  PTA he has HHA 2x/day for a total of 8 hours, then family can assist.  Patient is at his baseline: bedlevel up to total A for ADL and hoyer to/from bed.  No skilled OT needs exist, no post acute OT recommendations.  Home with prior level of supports.       If plan is discharge home, recommend the following:   Assist for transportation;Assistance with feeding;A lot of help with bathing/dressing/bathroom;Two people to help with walking and/or transfers     Functional Status Assessment   Patient has not had a recent decline in their functional status     Equipment Recommendations   None recommended by OT     Recommendations for Other Services         Precautions/Restrictions   Precautions Precautions: Fall Recall of Precautions/Restrictions: Intact Restrictions Weight Bearing Restrictions Per Provider Order: No     Mobility Bed Mobility Overal bed mobility: Needs Assistance Bed Mobility: Supine to Sit     Supine to sit: Max assist          Transfers Overall transfer level: Needs assistance   Transfers: Bed to chair/wheelchair/BSC     Squat pivot transfers: Total assist       General transfer comment: No AROM to B LE's, unable to bear weight.      Balance Overall balance assessment: Needs assistance Sitting-balance support: Feet supported Sitting balance-Leahy Scale: Zero   Postural control: Posterior lean, Right lateral lean, Left lateral lean                                 ADL either performed or assessed with clinical judgement   ADL Overall ADL's : At baseline                                        General ADL Comments: Assist as needed bedlevel     Vision Patient Visual Report: No change from baseline       Perception Perception: Not tested       Praxis Praxis: Not tested       Pertinent Vitals/Pain Pain Assessment Pain Assessment: No/denies pain     Extremity/Trunk Assessment Upper Extremity Assessment Upper Extremity Assessment: Generalized weakness;Right hand dominant   Lower Extremity Assessment Lower Extremity Assessment: Defer to PT evaluation   Cervical / Trunk Assessment Cervical / Trunk Assessment: Kyphotic   Communication Communication Communication: Impaired Factors Affecting Communication: Difficulty expressing self   Cognition Arousal: Alert Behavior During Therapy: WFL for tasks assessed/performed Cognition: No apparent impairments                               Following commands: Intact       Cueing  General Comments   Cueing Techniques: Verbal cues   VSS   Exercises     Shoulder Instructions      Home Living Family/patient expects to be discharged to:: Private residence Living Arrangements: Alone Available Help at  Discharge: Family;Friend(s);Personal care attendant;Available 24 hours/day Type of Home: House Home Access: Ramped entrance     Home Layout: One level     Bathroom Shower/Tub: Tub/shower unit;Sponge bathes at baseline   Bathroom Toilet: Standard Bathroom Accessibility: No   Home Equipment: Wheelchair - power;Wheelchair - manual;Hospital bed   Additional Comments: Nathan Marsh      Prior Functioning/Environment Prior Level of Function : Needs assist               ADLs Comments: HHA 2x/day, 4 hours each.  Assist with ADL,iADL, transfers    OT Problem List: Decreased strength   OT Treatment/Interventions:        OT Goals(Current goals can be found in the care plan section)   Acute Rehab OT Goals Patient Stated Goal: Return home OT Goal  Formulation: With patient Time For Goal Achievement: 01/05/25 Potential to Achieve Goals: Good   OT Frequency:       Co-evaluation              AM-PAC OT 6 Clicks Daily Activity     Outcome Measure Help from another person eating meals?: A Lot Help from another person taking care of personal grooming?: A Lot Help from another person toileting, which includes using toliet, bedpan, or urinal?: Total Help from another person bathing (including washing, rinsing, drying)?: A Lot Help from another person to put on and taking off regular upper body clothing?: A Lot Help from another person to put on and taking off regular lower body clothing?: Total 6 Click Score: 10   End of Session Equipment Utilized During Treatment: Gait belt Nurse Communication: Mobility status  Activity Tolerance: Patient tolerated treatment well Patient left: in chair;with call bell/phone within reach  OT Visit Diagnosis: Muscle weakness (generalized) (M62.81)                Time: 9164-9141 OT Time Calculation (min): 23 min Charges:  OT General Charges $OT Visit: 1 Visit OT Evaluation $OT Eval Moderate Complexity: 1 Mod OT Treatments $Therapeutic Activity: 8-22 mins  12/26/2024  RP, OTR/L  Acute Rehabilitation Services  Office:  [phone]   Nathan Marsh 12/26/2024, 9:04 AM

## 2024-12-26 NOTE — Progress Notes (Signed)
 "    Subjective/Chief Complaint: Had some vomiting this morning. Still with moderate amount of ostomy output Feels more distended   Objective: Vital signs in last 24 hours: Temp:  [97.7 F (36.5 C)-99.2 F (37.3 C)] 98.3 F (36.8 C) (12/30 0801) Pulse Rate:  [60-65] 65 (12/30 0801) Resp:  [17-19] 18 (12/30 0801) BP: (121-158)/(78-85) 154/85 (12/30 0801) SpO2:  [98 %-100 %] 99 % (12/30 0801) Last BM Date : 12/25/24  Intake/Output from previous day: 12/29 0701 - 12/30 0700 In: 886.9 [P.O.:240; I.V.:646.9] Out: 2650 [Urine:1000; Emesis/NG output:1000; Stool:650] Intake/Output this shift: No intake/output data recorded.  WDWN in NAD Abd - soft, moderately distended; non-tender Colostomy viable with large amount of brown liquid stool in bag  Lab Results:  Recent Labs    12/25/24 0326 12/26/24 0435  WBC 14.3* 13.3*  HGB 10.7* 11.8*  HCT 34.1* 37.7*  PLT 224 218   BMET Recent Labs    12/25/24 0326 12/26/24 0435  NA 145 143  K 3.5 3.6  CL 108 107  CO2 29 26  GLUCOSE 116* 100*  BUN 17 15  CREATININE 0.86 0.79  CALCIUM  8.7* 9.2   PT/INR No results for input(s): LABPROT, INR in the last 72 hours. ABG No results for input(s): PHART, HCO3 in the last 72 hours.  Invalid input(s): PCO2, PO2  Studies/Results: ECHOCARDIOGRAM COMPLETE Result Date: 12/25/2024    ECHOCARDIOGRAM REPORT   Patient Name:   Nathan Marsh Date of Exam: 12/25/2024 Medical Rec #:  [id number]        Height:       66.5 in Accession #:    [id number]       Weight:       138.4 lb Date of Birth:  02/19/1976        BSA:          1.720 m Patient Age:    48 years         BP:           127/78 mmHg Patient Gender: M                HR:           63 bpm. Exam Location:  Inpatient Procedure: 2D Echo, 3D Echo, Cardiac Doppler, Color Doppler and Strain Analysis            (Both Spectral and Color Flow Doppler were utilized during            procedure). Indications:    Elevated Troponin  History:         Patient has no prior history of Echocardiogram examinations.                 Risk Factors:elevated troponin and Current Smoker.  Sonographer:    Koleen Popper RDCS Referring Phys: [id number] CHRISTOPHER P DANFORD  Sonographer Comments: Global longitudinal strain was attempted. IMPRESSIONS  1. Left ventricular ejection fraction, by estimation, is 65 to 70%. Left ventricular ejection fraction by 3D volume is 68 %. The left ventricle has normal function. The left ventricle has no regional wall motion abnormalities. Left ventricular diastolic  parameters were normal. The average left ventricular global longitudinal strain is -18.2 %. The global longitudinal strain is normal.  2. Right ventricular systolic function is normal. The right ventricular size is normal.  3. The mitral valve is normal in structure. No evidence of mitral valve regurgitation. No evidence of mitral stenosis.  4. The aortic valve is tricuspid. Aortic valve regurgitation  is trivial. No aortic stenosis is present.  5. The inferior vena cava is normal in size with greater than 50% respiratory variability, suggesting right atrial pressure of 3 mmHg. FINDINGS  Left Ventricle: Left ventricular ejection fraction, by estimation, is 65 to 70%. Left ventricular ejection fraction by 3D volume is 68 %. The left ventricle has normal function. The left ventricle has no regional wall motion abnormalities. The average left ventricular global longitudinal strain is -18.2 %. Strain was performed and the global longitudinal strain is normal. The left ventricular internal cavity size was normal in size. There is no left ventricular hypertrophy. Left ventricular diastolic parameters were normal. Right Ventricle: The right ventricular size is normal. No increase in right ventricular wall thickness. Right ventricular systolic function is normal. Left Atrium: Left atrial size was normal in size. Right Atrium: Right atrial size was normal in size. Pericardium: There is no  evidence of pericardial effusion. Mitral Valve: The mitral valve is normal in structure. No evidence of mitral valve regurgitation. No evidence of mitral valve stenosis. Tricuspid Valve: The tricuspid valve is normal in structure. Tricuspid valve regurgitation is not demonstrated. No evidence of tricuspid stenosis. Aortic Valve: The aortic valve is tricuspid. Aortic valve regurgitation is trivial. No aortic stenosis is present. Pulmonic Valve: The pulmonic valve was normal in structure. Pulmonic valve regurgitation is mild. No evidence of pulmonic stenosis. Aorta: The aortic root is normal in size and structure. Venous: The inferior vena cava is normal in size with greater than 50% respiratory variability, suggesting right atrial pressure of 3 mmHg. IAS/Shunts: No atrial level shunt detected by color flow Doppler. Additional Comments: 3D was performed not requiring image post processing on an independent workstation and was normal.  LEFT VENTRICLE PLAX 2D LVIDd:         4.50 cm         Diastology LVIDs:         2.80 cm         LV e' medial:    9.79 cm/s LV PW:         1.20 cm         LV E/e' medial:  9.4 LV IVS:        1.10 cm         LV e' lateral:   13.60 cm/s LVOT diam:     2.08 cm         LV E/e' lateral: 6.8 LV SV:         82 LV SV Index:   47              2D Longitudinal LVOT Area:     3.40 cm        Strain LV IVRT:       77 msec         2D Strain GLS   -18.2 %                                Avg:  LV Volumes (MOD)               3D Volume EF LV vol d, MOD    80.3 ml       LV 3D EF:    Left A2C:  ventricul LV vol d, MOD    118.0 ml                   ar A4C:                                        ejection LV vol s, MOD    25.7 ml                    fraction A2C:                                        by 3D LV vol s, MOD    33.2 ml                    volume is A4C:                                        68 %. LV SV MOD A2C:   54.6 ml LV SV MOD A4C:   118.0 ml LV SV MOD BP:     69.1 ml       3D Volume EF:                                3D EF:        68 %                                LV EDV:       108 ml                                LV ESV:       35 ml                                LV SV:        73 ml RIGHT VENTRICLE             IVC RV Basal diam:  3.26 cm     IVC diam: 1.42 cm RV S prime:     17.60 cm/s TAPSE (M-mode): 2.6 cm LEFT ATRIUM             Index        RIGHT ATRIUM           Index LA diam:        3.02 cm 1.76 cm/m   RA Area:     10.10 cm LA Vol (A2C):   27.8 ml 16.16 ml/m  RA Volume:   21.50 ml  12.50 ml/m LA Vol (A4C):   36.3 ml 21.10 ml/m LA Biplane Vol: 34.0 ml 19.77 ml/m  AORTIC VALVE LVOT Vmax:   133.00 cm/s LVOT Vmean:  83.300 cm/s LVOT VTI:    0.240 m  AORTA Ao Root diam: 3.37 cm Ao Asc diam:  3.44 cm MITRAL VALVE MV Area (PHT): 4.31 cm    SHUNTS MV Decel Time: 176 msec  Systemic VTI:  0.24 m MR Peak grad: 26.8 mmHg    Systemic Diam: 2.08 cm MR Vmax:      259.00 cm/s MV E velocity: 92.20 cm/s MV A velocity: 55.50 cm/s MV E/A ratio:  1.66 Nathan Marsh Electronically signed by Nathan Marsh Signature Date/Time: 12/25/2024/8:51:20 PM    Final    DG Abd 1 View Result Date: 12/24/2024 CLINICAL DATA:  NG tube placement. EXAM: ABDOMEN - 1 VIEW COMPARISON:  None Available. FINDINGS: Dilated loops of small bowel are noted in the upper abdomen measuring up to 6.1 cm in the mid right abdomen. There is gaseous distention of the stomach. An ostomy is noted in the left lower quadrant. An enteric tube terminates in the stomach and appears appropriate in position. No radio-opaque calculi or other acute radiographic abnormality are seen. IMPRESSION: 1. Markedly distended small bowel, compatible with known small-bowel obstruction. 2. NG tube appears appropriate in position. Electronically Signed   By: Leita Birmingham M.D.   On: 12/24/2024 14:31    Anti-infectives: Anti-infectives (From admission, onward)    None       Assessment/Plan: SBO s/p left  hemicolectomy/ descending colostomy at [facility] May 2025 - CT w/ High grade small bowel obstruction. A transition point is seen where there is mesenteric tethering in the lower central abdomen with ? of internal hernia. No pneumatosis or free air. Trace free fluid in pelvis.  - SBO appears to be partial, since patient is having colostomy output - NG removed 12/29 since having increasing ostomy output -However, today he seems more distended and having nausea/ vomiting - repeat films today.  May consider adding Reglan  to improve motility    FEN - back off to ice chips/ NPO, IVF per [facility] VTE - SCDs, okay for chem ppx from a general surgery standpoint ID - None Foley - Ext, bladder scan, strict I/O  LOS: 3 days    Nathan Marsh 12/26/2024  "

## 2024-12-26 NOTE — Progress Notes (Signed)
" °  Progress Note   Patient: Nathan Marsh FMW:[id number] DOB: 10-02-1976 DOA: 12/23/2024     3 DOS: the patient was seen and examined on 12/26/2024        Brief hospital course: 48 y.o. 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       Assessment and Plan: SBO See summary from 12/29 Worsened overnight more bloated, vomiting again. - Clears - Hold discharge   Multiple sclerosis Neurogenic bladder History colostomy On Tysabri  - Continue baclfoen - Hold oxybutynin   Elevated troponin No new symptoms.  Low level, not dynamic.   ECG with minimal ST depressions, no chest symptoms.  Probably nonishcemic myocardial injury not ischemia.  Echo normal, ruled out ischemia.        Subjective: Vomiting overnight, and this morning.  No fever, no cnofusion.  Abdomen more firm again.     Physical Exam: BP 107/69 (BP Location: Left Arm)   Pulse 68   Temp 98.6 F (37 C)   Resp 18   Ht 5' 6.5 (1.689 m)   Wt 62.8 kg   SpO2 99%   BMI 22.01 kg/m   General: Pt is awake, very low vision, notable psychomotor slowing unchanged, dry oral mucosa.   Cardiovascular: RRR, no murmurs, no peripheral edema   Respiratory: Normal respiratory rate and rhythm.  CTAB without rales or wheezes. Abdominal: Abdomen has some involuntary guarding throughout, worse than yesterday,   Neuro/Psych: Incresed tone thoughout.  Slowed responses.  Oriented to slef, hospital,     Data Reviewed:     Family Communication:     Disposition: Status is: Inpatient         Author: Lonni SHAUNNA Dalton, MD 12/26/2024 7:03 PM  For on call review [email or web address].    "

## 2024-12-26 NOTE — Progress Notes (Signed)
 Bladder scan showed  147 ml . Patient tolerated well.

## 2024-12-26 NOTE — Care Management Important Message (Signed)
 Important Message  Patient Details  Name: Nathan Marsh MRN: [id number] Date of Birth: 02-21-1976   Important Message Given:  Yes - Medicare IM     Claretta Deed 12/26/2024, 3:18 PM

## 2024-12-27 DIAGNOSIS — K56609 Unspecified intestinal obstruction, unspecified as to partial versus complete obstruction: Secondary | ICD-10-CM | POA: Diagnosis not present

## 2024-12-27 LAB — BASIC METABOLIC PANEL WITH GFR
Anion gap: 8 (ref 5–15)
BUN: 14 mg/dL (ref 6–20)
CO2: 26 mmol/L (ref 22–32)
Calcium: 8.7 mg/dL — ABNORMAL LOW (ref 8.9–10.3)
Chloride: 108 mmol/L (ref 98–111)
Creatinine, Ser: 0.79 mg/dL (ref 0.61–1.24)
GFR, Estimated: >60 mL/min
Glucose, Bld: 110 mg/dL — ABNORMAL HIGH (ref 70–99)
Potassium: 3.7 mmol/L (ref 3.5–5.1)
Sodium: 142 mmol/L (ref 135–145)

## 2024-12-27 LAB — CBC
HCT: 34.5 % — ABNORMAL LOW (ref 39.0–52.0)
Hemoglobin: 10.7 g/dL — ABNORMAL LOW (ref 13.0–17.0)
MCH: 23.3 pg — ABNORMAL LOW (ref 26.0–34.0)
MCHC: 31 g/dL (ref 30.0–36.0)
MCV: 75.2 fL — ABNORMAL LOW (ref 80.0–100.0)
Platelets: 223 K/uL (ref 150–400)
RBC: 4.59 MIL/uL (ref 4.22–5.81)
RDW: 17.2 % — ABNORMAL HIGH (ref 11.5–15.5)
WBC: 9.1 K/uL (ref 4.0–10.5)
nRBC: 7.6 % — ABNORMAL HIGH (ref 0.0–0.2)

## 2024-12-27 NOTE — Plan of Care (Signed)

## 2024-12-27 NOTE — Progress Notes (Signed)
 "    Subjective/Chief Complaint: No nausea or vomiting States that he feels less distended than yesterday    Objective: Vital signs in last 24 hours: Temp:  [98.5 F (36.9 C)-98.8 F (37.1 C)] 98.8 F (37.1 C) (12/31 0755) Pulse Rate:  [58-68] 58 (12/31 0755) Resp:  [13-18] 13 (12/31 0755) BP: (107-144)/(69-86) 116/75 (12/31 0755) SpO2:  [98 %-100 %] 100 % (12/31 0755) Last BM Date : 12/26/24  Intake/Output from previous day: 12/30 0701 - 12/31 0700 In: 1662.2 [P.O.:85; I.V.:1577.2] Out: 750 [Urine:150; Stool:600] Intake/Output this shift: No intake/output data recorded.  WDWN in NAD Abd - soft, moderately distended; non-tender Colostomy viable with large amount of brown liquid stool in bag    Lab Results:  Recent Labs    12/26/24 0435 12/27/24 0631  WBC 13.3* 9.1  HGB 11.8* 10.7*  HCT 37.7* 34.5*  PLT 218 223   BMET Recent Labs    12/26/24 0435 12/27/24 0631  NA 143 142  K 3.6 3.7  CL 107 108  CO2 26 26  GLUCOSE 100* 110*  BUN 15 14  CREATININE 0.79 0.79  CALCIUM  9.2 8.7*    Studies/Results: DG Abd Portable 1V Result Date: 12/26/2024 CLINICAL DATA:  Small bowel obstruction. EXAM: PORTABLE ABDOMEN - 1 VIEW COMPARISON:  Radiograph 12/24/2024, CT 12/23/2024 FINDINGS: Gaseous small bowel distension, minimally improved from yesterday's radiograph. Contrast in the left colon, chronic. Gaseous gastric distension. No obvious free air. IMPRESSION: Gaseous small bowel distension, minimally improved from yesterday's radiograph. Electronically Signed   By: Andrea Gasman M.D.   On: 12/26/2024 16:35   ECHOCARDIOGRAM COMPLETE Result Date: 12/25/2024    ECHOCARDIOGRAM REPORT   Patient Name:   Nathan Marsh Date of Exam: 12/25/2024 Medical Rec #:  [id number]        Height:       66.5 in Accession #:    [id number]       Weight:       138.4 lb Date of Birth:  [birth year]        BSA:          1.720 m Patient Age:    48 years         BP:           127/78 mmHg Patient  Gender: M                HR:           63 bpm. Exam Location:  Inpatient Procedure: 2D Echo, 3D Echo, Cardiac Doppler, Color Doppler and Strain Analysis            (Both Spectral and Color Flow Doppler were utilized during            procedure). Indications:    Elevated Troponin  History:        Patient has no prior history of Echocardiogram examinations.                 Risk Factors:elevated troponin and Current Smoker.  Sonographer:    Koleen Popper RDCS Referring Phys: [id number] CHRISTOPHER P DANFORD  Sonographer Comments: Global longitudinal strain was attempted. IMPRESSIONS  1. Left ventricular ejection fraction, by estimation, is 65 to 70%. Left ventricular ejection fraction by 3D volume is 68 %. The left ventricle has normal function. The left ventricle has no regional wall motion abnormalities. Left ventricular diastolic  parameters were normal. The average left ventricular global longitudinal strain is -18.2 %. The global longitudinal strain is normal.  2. Right ventricular systolic function is normal. The right ventricular size is normal.  3. The mitral valve is normal in structure. No evidence of mitral valve regurgitation. No evidence of mitral stenosis.  4. The aortic valve is tricuspid. Aortic valve regurgitation is trivial. No aortic stenosis is present.  5. The inferior vena cava is normal in size with greater than 50% respiratory variability, suggesting right atrial pressure of 3 mmHg. FINDINGS  Left Ventricle: Left ventricular ejection fraction, by estimation, is 65 to 70%. Left ventricular ejection fraction by 3D volume is 68 %. The left ventricle has normal function. The left ventricle has no regional wall motion abnormalities. The average left ventricular global longitudinal strain is -18.2 %. Strain was performed and the global longitudinal strain is normal. The left ventricular internal cavity size was normal in size. There is no left ventricular hypertrophy. Left ventricular diastolic  parameters were normal. Right Ventricle: The right ventricular size is normal. No increase in right ventricular wall thickness. Right ventricular systolic function is normal. Left Atrium: Left atrial size was normal in size. Right Atrium: Right atrial size was normal in size. Pericardium: There is no evidence of pericardial effusion. Mitral Valve: The mitral valve is normal in structure. No evidence of mitral valve regurgitation. No evidence of mitral valve stenosis. Tricuspid Valve: The tricuspid valve is normal in structure. Tricuspid valve regurgitation is not demonstrated. No evidence of tricuspid stenosis. Aortic Valve: The aortic valve is tricuspid. Aortic valve regurgitation is trivial. No aortic stenosis is present. Pulmonic Valve: The pulmonic valve was normal in structure. Pulmonic valve regurgitation is mild. No evidence of pulmonic stenosis. Aorta: The aortic root is normal in size and structure. Venous: The inferior vena cava is normal in size with greater than 50% respiratory variability, suggesting right atrial pressure of 3 mmHg. IAS/Shunts: No atrial level shunt detected by color flow Doppler. Additional Comments: 3D was performed not requiring image post processing on an independent workstation and was normal.  LEFT VENTRICLE PLAX 2D LVIDd:         4.50 cm         Diastology LVIDs:         2.80 cm         LV e' medial:    9.79 cm/s LV PW:         1.20 cm         LV E/e' medial:  9.4 LV IVS:        1.10 cm         LV e' lateral:   13.60 cm/s LVOT diam:     2.08 cm         LV E/e' lateral: 6.8 LV SV:         82 LV SV Index:   47              2D Longitudinal LVOT Area:     3.40 cm        Strain LV IVRT:       77 msec         2D Strain GLS   -18.2 %                                Avg:  LV Volumes (MOD)               3D Volume EF LV vol d, MOD    80.3 ml  LV 3D EF:    Left A2C:                                        ventricul LV vol d, MOD    118.0 ml                   ar A4C:                                         ejection LV vol s, MOD    25.7 ml                    fraction A2C:                                        by 3D LV vol s, MOD    33.2 ml                    volume is A4C:                                        68 %. LV SV MOD A2C:   54.6 ml LV SV MOD A4C:   118.0 ml LV SV MOD BP:    69.1 ml       3D Volume EF:                                3D EF:        68 %                                LV EDV:       108 ml                                LV ESV:       35 ml                                LV SV:        73 ml RIGHT VENTRICLE             IVC RV Basal diam:  3.26 cm     IVC diam: 1.42 cm RV S prime:     17.60 cm/s TAPSE (M-mode): 2.6 cm LEFT ATRIUM             Index        RIGHT ATRIUM           Index LA diam:        3.02 cm 1.76 cm/m   RA Area:     10.10 cm LA Vol (A2C):   27.8 ml 16.16 ml/m  RA Volume:   21.50 ml  12.50 ml/m LA Vol (A4C):   36.3 ml 21.10 ml/m LA Biplane Vol: 34.0 ml 19.77 ml/m  AORTIC VALVE LVOT  Vmax:   133.00 cm/s LVOT Vmean:  83.300 cm/s LVOT VTI:    0.240 m  AORTA Ao Root diam: 3.37 cm Ao Asc diam:  3.44 cm MITRAL VALVE MV Area (PHT): 4.31 cm    SHUNTS MV Decel Time: 176 msec    Systemic VTI:  0.24 m MR Peak grad: 26.8 mmHg    Systemic Diam: 2.08 cm MR Vmax:      259.00 cm/s MV E velocity: 92.20 cm/s MV A velocity: 55.50 cm/s MV E/A ratio:  1.66 Morene Brownie Electronically signed by Morene Brownie Signature Date/Time: 12/25/2024/8:51:20 PM    Final     Anti-infectives: Anti-infectives (From admission, onward)    None       Assessment/Plan: SBO s/p left hemicolectomy/ descending colostomy at [facility] May 2025 - CT w/ High grade small bowel obstruction. A transition point is seen where there is mesenteric tethering in the lower central abdomen with ? of internal hernia. No pneumatosis or free air. Trace free fluid in pelvis.  - SBO appears to be partial, since patient is having colostomy output - NG removed 12/29 since having increasing ostomy  output - Less distended today - restart clear liquids, ADAT - Received one day of Reglan  to improve motility   After discharge, should follow-up with his surgeons at [facility].   FEN - CLD, ADAT, IVF per [facility] VTE - SCDs, okay for chem ppx from a general surgery standpoint ID - None Foley - Ext, bladder scan, strict I/O  LOS: 4 days    Nathan Marsh 12/27/2024  "

## 2024-12-27 NOTE — Plan of Care (Signed)

## 2024-12-27 NOTE — Progress Notes (Signed)
" °  Progress Note   Patient: Nathan Marsh FMW:[id number] DOB: Feb 05, 1976 DOA: 12/23/2024     4 DOS: the patient was seen and examined on 12/27/2024 at 10:36 AM      Brief hospital course: 48 y.o. 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       Assessment and Plan: SBO See summary from 12/29 Output from ostomy appears better, abdomen seems less distended. - Advance diet as tolerated     Multiple sclerosis Neurogenic bladder History colostomy On Tysabri  - Continue baclofen  - Hold oxybutynin   Elevated troponin No new symptoms.  Low level, not dynamic.   ECG with minimal ST depressions, no chest symptoms.  Probably nonishcemic myocardial injury not ischemia.  Echo normal, ruled out ischemia.        Subjective: Doing well, no further vomiting since yesterday morning, no fever, no confusion.  Abdomen less distended.  States he feels great     Physical Exam: BP 116/75 (BP Location: Left Arm)   Pulse (!) 58   Temp 98.8 F (37.1 C) (Oral)   Resp 13   Ht 5' 6.5 (1.689 m)   Wt 62.8 kg   SpO2 100%   BMI 22.01 kg/m   General: Adult male, lying in bed, very low vision, with notable psychomotor slowing, unchanged Cardiovascular: RRR, no murmurs, no peripheral edema Respiratory: Normal respiratory rate and rhythm, lungs clear without rales or wheezes Abdominal: Abdomen with chronic abdominal guarding, seems involuntary due to spasticity, softer than yesterday, no grimace to palpation Neuro/Psych: Increased tone throughout, psychomotor slowing, oriented to self, hospital, place, situation    Data Reviewed: Discussed with general surgery Basic metabolic panel normal CBC shows stable anemia   Family Communication: Sister by phone    Disposition: Status is: Inpatient Likely home with home aides tomorrow morning if able to tolerate oral intake         Author: Lonni SHAUNNA Dalton, MD 12/27/2024 1:37 PM  For on call review [email or web address].    "

## 2024-12-28 DIAGNOSIS — K56609 Unspecified intestinal obstruction, unspecified as to partial versus complete obstruction: Secondary | ICD-10-CM | POA: Diagnosis not present

## 2024-12-28 NOTE — TOC Transition Note (Signed)
 Transition of Care [facility]) - Discharge Note   Patient Details  Name: Nathan Marsh MRN: [id number] Date of Birth: 10-May-1976  [facility]) CM/SW Contact:  Roxie KANDICE Stain, RN Phone Number: 12/28/2024, 8:57 AM   Clinical Narrative:    Patient stable for discharge.  Patient to follow up with PCP.    [facility] called. Patient states He has a key. Bedside RN will notifiy sister.    Final next level of care: Home/Self Care Barriers to Discharge: Barriers Resolved   Patient Goals and CMS Choice Patient states their goals for this hospitalization and ongoing recovery are:: return home          Discharge Placement                 Home      Discharge Plan and Services Additional resources added to the After Visit Summary for     Discharge Planning Services: CM Consult                                 Social Drivers of Health (SDOH) Interventions SDOH Screenings   Food Insecurity: No Food Insecurity (12/25/2024)  Housing: Low Risk (12/25/2024)  Transportation Needs: No Transportation Needs (12/25/2024)  Utilities: Not At Risk (12/25/2024)  Depression (PHQ2-9): Low Risk (11/03/2023)  Financial Resource Strain: Low Risk (11/03/2023)  Physical Activity: Insufficiently Active (11/03/2023)  Social Connections: Socially Isolated (11/03/2023)  Stress: No Stress Concern Present (11/03/2023)  Tobacco Use: High Risk (12/23/2024)  Health Literacy: Adequate Health Literacy (11/03/2023)     Readmission Risk Interventions    12/28/2024    8:56 AM  Readmission Risk Prevention Plan  Post Dischage Appt Complete  Medication Screening Complete  Transportation Screening Complete

## 2024-12-28 NOTE — Discharge Summary (Signed)
 " Physician Discharge Summary   Patient: Nathan Marsh MRN: [id number] DOB: 02-08-1976  Admit date:     12/23/2024  Discharge date: 12/28/2024  Discharge Physician: Lonni SHAUNNA Dalton   PCP: [name], PA     Recommendations at discharge:  Follow-up with PCP in 1 week for small bowel obstruction     Discharge Diagnoses: Principal Problem:   SBO (small bowel obstruction) (HCC) Active Problems:   Multiple sclerosis   Leukocytosis   Neurogenic bladder   Resolved Problems:   * No resolved hospital problems. *   Hospital Course: 49 y.o. 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     Small bowel obstruction Admitted and monitored with serial abdominal exam.  Abdominal exam was benign, NG tube was placed, and he started to have output from ostomy.  NG tube was removed, and over 48 hours, the patient gradually advance his diet, on the day of discharge was tolerating oral intake, and had good output from his ostomy.  Multiple sclerosis Neurogenic bladder History of colostomy On Tysabri , and baclofen .  Recommended holding oxybutynin  for the time being.  Elevated troponin, myocardial injury from small bowel obstruction Minimal elevation troponin, not dynamic.  EKG showed small ST depressions in single lead, no chest symptoms.  Echocardiogram obtained, showed no rotational wall motion abnormalities and normal EF.  Ischemia and infarction ruled out.         The [place]  Controlled Substances Registry was reviewed for this patient prior to discharge.  Consultants: General surgery Procedures performed: Echocardiogram Disposition: Home Diet recommendation:  Regular diet  DISCHARGE MEDICATION: Allergies as of 12/28/2024   No Known Allergies      Medication List     PAUSE taking these medications    oxybutynin  5 MG tablet Wait to take this until your doctor or other care  provider tells you to start again. Commonly known as: DITROPAN  Take 1 tablet (5 mg total) by mouth 2 (two) times daily.       TAKE these medications    baclofen  10 MG tablet Commonly known as: LIORESAL  Take 10 mg by mouth 3 (three) times daily.        Follow-up Information     [name], [name]. Schedule an appointment as soon as possible for a visit in 1 week(s).   Specialty: Physician Assistant Contact information: [address] [place] [address] [phone]                 Discharge Instructions     Discharge instructions   Complete by: As directed    **IMPORTANT DISCHARGE INSTRUCTIONS**   From Dr. Dalton: You were admitted for a small bowel obstruction  Here, you had an NG tube and the obstruction resolved.  For the next week, hold (do not take) your oxybutynin .  This can slow the intestines somewhat.   Go see your primary doctor in 1 week, ask them if you can resume it  While you were here, you had some abnormal heart enzymes, troponins. These were very mildly abnormal, and when we did an echocardiogram (ultrasound of the heart) we could see that every thing was better   Increase activity slowly   Complete by: As directed        Discharge Exam: Filed Weights   12/25/24 0106  Weight: 62.8 kg    General: Pt is alert, awake, not in acute distress, pleasant Cardiovascular: RRR, nl S1-S2, no  murmurs appreciated.   No LE edema.   Respiratory: Normal respiratory rate and rhythm.  CTAB without rales or wheezes. Abdominal: Abdomen with chronically increased tone, but no tenderness palpation.  No distension or HSM.   Neuro/Psych: Psychomotor slowing noted, increased tone in all 4 extremities.  Judgment and insight appear at baseline.   Condition at discharge: good  The results of significant diagnostics from this hospitalization (including imaging, microbiology, ancillary and laboratory) are listed below for reference.   Imaging  Studies: DG Abd Portable 1V Result Date: 12/26/2024 CLINICAL DATA:  Small bowel obstruction. EXAM: PORTABLE ABDOMEN - 1 VIEW COMPARISON:  Radiograph 12/24/2024, CT 12/23/2024 FINDINGS: Gaseous small bowel distension, minimally improved from yesterday's radiograph. Contrast in the left colon, chronic. Gaseous gastric distension. No obvious free air. IMPRESSION: Gaseous small bowel distension, minimally improved from yesterday's radiograph. Electronically Signed   By: Andrea Gasman M.D.   On: 12/26/2024 16:35   ECHOCARDIOGRAM COMPLETE Result Date: 12/25/2024    ECHOCARDIOGRAM REPORT   Patient Name:   Nathan Marsh Date of Exam: 12/25/2024 Medical Rec #:  [id number]        Height:       66.5 in Accession #:    [id number]       Weight:       138.4 lb Date of Birth:  November 07, 1976        BSA:          1.720 m Patient Age:    49 years         BP:           127/78 mmHg Patient Gender: M                HR:           63 bpm. Exam Location:  Inpatient Procedure: 2D Echo, 3D Echo, Cardiac Doppler, Color Doppler and Strain Analysis            (Both Spectral and Color Flow Doppler were utilized during            procedure). Indications:    Elevated Troponin  History:        Patient has no prior history of Echocardiogram examinations.                 Risk Factors:elevated troponin and Current Smoker.  Sonographer:    Koleen Popper RDCS Referring Phys: [id number] Neveen Daponte P Jennalee Greaves  Sonographer Comments: Global longitudinal strain was attempted. IMPRESSIONS  1. Left ventricular ejection fraction, by estimation, is 65 to 70%. Left ventricular ejection fraction by 3D volume is 68 %. The left ventricle has normal function. The left ventricle has no regional wall motion abnormalities. Left ventricular diastolic  parameters were normal. The average left ventricular global longitudinal strain is -18.2 %. The global longitudinal strain is normal.  2. Right ventricular systolic function is normal. The right ventricular size is  normal.  3. The mitral valve is normal in structure. No evidence of mitral valve regurgitation. No evidence of mitral stenosis.  4. The aortic valve is tricuspid. Aortic valve regurgitation is trivial. No aortic stenosis is present.  5. The inferior vena cava is normal in size with greater than 50% respiratory variability, suggesting right atrial pressure of 3 mmHg. FINDINGS  Left Ventricle: Left ventricular ejection fraction, by estimation, is 65 to 70%. Left ventricular ejection fraction by 3D volume is 68 %. The left ventricle has normal function. The left ventricle has no regional wall motion abnormalities. The average  left ventricular global longitudinal strain is -18.2 %. Strain was performed and the global longitudinal strain is normal. The left ventricular internal cavity size was normal in size. There is no left ventricular hypertrophy. Left ventricular diastolic parameters were normal. Right Ventricle: The right ventricular size is normal. No increase in right ventricular wall thickness. Right ventricular systolic function is normal. Left Atrium: Left atrial size was normal in size. Right Atrium: Right atrial size was normal in size. Pericardium: There is no evidence of pericardial effusion. Mitral Valve: The mitral valve is normal in structure. No evidence of mitral valve regurgitation. No evidence of mitral valve stenosis. Tricuspid Valve: The tricuspid valve is normal in structure. Tricuspid valve regurgitation is not demonstrated. No evidence of tricuspid stenosis. Aortic Valve: The aortic valve is tricuspid. Aortic valve regurgitation is trivial. No aortic stenosis is present. Pulmonic Valve: The pulmonic valve was normal in structure. Pulmonic valve regurgitation is mild. No evidence of pulmonic stenosis. Aorta: The aortic root is normal in size and structure. Venous: The inferior vena cava is normal in size with greater than 50% respiratory variability, suggesting right atrial pressure of 3 mmHg.  IAS/Shunts: No atrial level shunt detected by color flow Doppler. Additional Comments: 3D was performed not requiring image post processing on an independent workstation and was normal.  LEFT VENTRICLE PLAX 2D LVIDd:         4.50 cm         Diastology LVIDs:         2.80 cm         LV e' medial:    9.79 cm/s LV PW:         1.20 cm         LV E/e' medial:  9.4 LV IVS:        1.10 cm         LV e' lateral:   13.60 cm/s LVOT diam:     2.08 cm         LV E/e' lateral: 6.8 LV SV:         82 LV SV Index:   47              2D Longitudinal LVOT Area:     3.40 cm        Strain LV IVRT:       77 msec         2D Strain GLS   -18.2 %                                Avg:  LV Volumes (MOD)               3D Volume EF LV vol d, MOD    80.3 ml       LV 3D EF:    Left A2C:                                        ventricul LV vol d, MOD    118.0 ml                   ar A4C:  ejection LV vol s, MOD    25.7 ml                    fraction A2C:                                        by 3D LV vol s, MOD    33.2 ml                    volume is A4C:                                        68 %. LV SV MOD A2C:   54.6 ml LV SV MOD A4C:   118.0 ml LV SV MOD BP:    69.1 ml       3D Volume EF:                                3D EF:        68 %                                LV EDV:       108 ml                                LV ESV:       35 ml                                LV SV:        73 ml RIGHT VENTRICLE             IVC RV Basal diam:  3.26 cm     IVC diam: 1.42 cm RV S prime:     17.60 cm/s TAPSE (M-mode): 2.6 cm LEFT ATRIUM             Index        RIGHT ATRIUM           Index LA diam:        3.02 cm 1.76 cm/m   RA Area:     10.10 cm LA Vol (A2C):   27.8 ml 16.16 ml/m  RA Volume:   21.50 ml  12.50 ml/m LA Vol (A4C):   36.3 ml 21.10 ml/m LA Biplane Vol: 34.0 ml 19.77 ml/m  AORTIC VALVE LVOT Vmax:   133.00 cm/s LVOT Vmean:  83.300 cm/s LVOT VTI:    0.240 m  AORTA Ao Root diam: 3.37 cm Ao Asc diam:   3.44 cm MITRAL VALVE MV Area (PHT): 4.31 cm    SHUNTS MV Decel Time: 176 msec    Systemic VTI:  0.24 m MR Peak grad: 26.8 mmHg    Systemic Diam: 2.08 cm MR Vmax:      259.00 cm/s MV E velocity: 92.20 cm/s MV A velocity: 55.50 cm/s MV E/A ratio:  1.66 Morene Brownie Electronically signed by Morene Brownie Signature Date/Time: 12/25/2024/8:51:20 PM    Final    DG Abd 1 View Result Date: 12/24/2024 CLINICAL  DATA:  NG tube placement. EXAM: ABDOMEN - 1 VIEW COMPARISON:  None Available. FINDINGS: Dilated loops of small bowel are noted in the upper abdomen measuring up to 6.1 cm in the mid right abdomen. There is gaseous distention of the stomach. An ostomy is noted in the left lower quadrant. An enteric tube terminates in the stomach and appears appropriate in position. No radio-opaque calculi or other acute radiographic abnormality are seen. IMPRESSION: 1. Markedly distended small bowel, compatible with known small-bowel obstruction. 2. NG tube appears appropriate in position. Electronically Signed   By: Leita Birmingham M.D.   On: 12/24/2024 14:31   CT ABDOMEN PELVIS W CONTRAST Result Date: 12/23/2024 IMPRESSION: 1. High grade small bowel obstruction. No pneumatosis or free air. A transition point is seen where there is mesenteric tethering in the lower central abdomen. Internal hernia cannot be excluded at this level. 2. Small hiatal hernia distended with fluid. 3. Left sided colostomy with nondilated colon. 4. Trace free fluid in the pelvis. Electronically signed by: Greig Pique MD 12/23/2024 05:52 PM EST RP Workstation: [id number]    Microbiology: Results for orders placed or performed during the hospital encounter of 12/23/24  Resp panel by RT-PCR (RSV, Flu A&B, Covid) Anterior Nasal Swab     Status: None   Collection Time: 12/23/24  2:43 PM   Specimen: Anterior Nasal Swab  Result Value Ref Range Status   SARS Coronavirus 2 by RT PCR NEGATIVE NEGATIVE Final   Influenza A by PCR NEGATIVE NEGATIVE  Final   Influenza B by PCR NEGATIVE NEGATIVE Final    Comment: (NOTE) The Xpert Xpress SARS-CoV-2/FLU/RSV plus assay is intended as an aid in the diagnosis of influenza from Nasopharyngeal swab specimens and should not be used as a sole basis for treatment. Nasal washings and aspirates are unacceptable for Xpert Xpress SARS-CoV-2/FLU/RSV testing.  Fact Sheet for Patients: bloggercourse.com  Fact Sheet for Healthcare Providers: seriousbroker.it  This test is not yet approved or cleared by the [place]  FDA and has been authorized for detection and/or diagnosis of SARS-CoV-2 by FDA under an Emergency Use Authorization (EUA). This EUA will remain in effect (meaning this test can be used) for the duration of the COVID-19 declaration under Section 564(b)(1) of the Act, 21 U.S.C. section 360bbb-3(b)(1), unless the authorization is terminated or revoked.     Resp Syncytial Virus by PCR NEGATIVE NEGATIVE Final    Comment: (NOTE) Fact Sheet for Patients: bloggercourse.com  Fact Sheet for Healthcare Providers: seriousbroker.it  This test is not yet approved or cleared by the [place]  FDA and has been authorized for detection and/or diagnosis of SARS-CoV-2 by FDA under an Emergency Use Authorization (EUA). This EUA will remain in effect (meaning this test can be used) for the duration of the COVID-19 declaration under Section 564(b)(1) of the Act, 21 U.S.C. section 360bbb-3(b)(1), unless the authorization is terminated or revoked.  Performed at [facility], [address] [place], [place] [address]     Labs: CBC: Recent Labs  Lab 12/23/24 1310 12/24/24 0222 12/25/24 0326 12/26/24 0435 12/27/24 0631  WBC 12.5* 19.6* 14.3* 13.3* 9.1  HGB 15.1 13.4 10.7* 11.8* 10.7*  HCT 48.4 43.8 34.1* 37.7* 34.5*  MCV 75.5* 76.8* 75.8* 76.3* 75.2*  PLT 281 246 224 218 223    Basic Metabolic Panel: Recent Labs  Lab 12/23/24 1310 12/24/24 0222 12/25/24 0326 12/26/24 0435 12/27/24 0631  NA 140 139 145 143 142  K 4.2 4.4 3.5 3.6 3.7  CL 99  102 108 107 108  CO2 26 25 29 26 26   GLUCOSE 117* 114* 116* 100* 110*  BUN 20 22* 17 15 14   CREATININE 0.91 1.07 0.86 0.79 0.79  CALCIUM  10.8* 9.0 8.7* 9.2 8.7*   Liver Function Tests: Recent Labs  Lab 12/23/24 1310 12/25/24 0326  AST 22 17  ALT 25 14  ALKPHOS 95 55  BILITOT 0.7 0.7  PROT 8.8* 5.9*  ALBUMIN 5.1* 3.7   CBG: No results for input(s): GLUCAP in the last 168 hours.  Discharge time spent: approximately 25 minutes spent on discharge counseling, evaluation of patient on day of discharge, and coordination of discharge planning with nursing, social work, pharmacy and case management  Signed: Lonni SHAUNNA Dalton, MD [facility] 12/28/2024         "

## 2024-12-28 NOTE — Plan of Care (Signed)
 ?  Problem: Clinical Measurements: ?Goal: Ability to maintain clinical measurements within normal limits will improve ?Outcome: Progressing ?Goal: Will remain free from infection ?Outcome: Progressing ?Goal: Diagnostic test results will improve ?Outcome: Progressing ?  ?

## 2024-12-29 ENCOUNTER — Ambulatory Visit

## 2024-12-29 ENCOUNTER — Telehealth: Payer: Self-pay | Admitting: *Deleted

## 2024-12-29 NOTE — Transitions of Care (Post Inpatient/ED Visit) (Signed)
 "  12/29/2024  Name: Nathan Marsh MRN: [id number] DOB: 1975-12-29  Today's TOC FU Call Status: Today's TOC FU Call Status:: Successful TOC FU Call Completed TOC FU Call Complete Date: 12/29/24  Patient's Name and Date of Birth confirmed. Name, DOB  Transition Care Management Follow-up Telephone Call Date of Discharge: 12/28/24 Discharge Facility: [facility]) Type of Discharge: Inpatient Admission Primary Inpatient Discharge Diagnosis:: SBO (small bowel obstruction) How have you been since you were released from the hospital?:  (appetite good, incontinent of urine- adult diapers, having regular bowel movements (sometimes watery), uses WC/ hoyer lift) Any questions or concerns?: No  Items Reviewed: Did you receive and understand the discharge instructions provided?: Yes Medications obtained,verified, and reconciled?: Yes (Medications Reviewed) Any new allergies since your discharge?: No Dietary orders reviewed?: Yes Type of Diet Ordered:: heart healthy Do you have support at home?: Yes People in Home [RPT]: alone Name of Support/Comfort Primary Source: has caregiver through medicaid benefit 9am-1pm,  5pm-9 pm,  alone 9pm-9am, has family also can call on  Medications Reviewed Today: Medications Reviewed Today     Reviewed by Aura Mliss LABOR, RN (Registered Nurse) on 12/29/24 at 1408  Med List Status: <None>   Medication Order Taking? Sig Documenting Provider Last Dose Status Informant  baclofen  (LIORESAL ) 10 MG tablet [id number] Yes Take 10 mg by mouth 3 (three) times daily. [provider]  Active Self           Med Note (SATTERFIELD, [name]   Sat Dec 23, 2024 10:41 PM) Patient verified he is taking this medication   oxybutynin  (DITROPAN ) 5 MG tablet [id number]  Take 1 tablet (5 mg total) by mouth 2 (two) times daily.  Patient not taking: Reported on 12/29/2024   [name], [name]  Active Self           Med Note (SATTERFIELD, [name]   Sat Dec 23, 2024 10:41  PM) Patient verified he is taking this medication             Home Care and Equipment/Supplies: Were Home Health Services Ordered?: No Any new equipment or medical supplies ordered?: No  Functional Questionnaire: Do you need assistance with bathing/showering or dressing?:  (bed bath per aide/ pt) Do you need assistance with meal preparation?:  (aide provides oversight, prepares meals) Do you need assistance with eating?: No Do you have difficulty maintaining continence: Yes (adult diapers) Do you need assistance with getting out of bed/getting out of a chair/moving?:  (WC, hoyer lift) Do you have difficulty managing or taking your medications?: No  Follow up appointments reviewed: PCP Follow-up appointment confirmed?: No (pt declined for RN CM to schedule appointment, states caregiver will schedule) MD Provider Line Number:[phone] Given: No Specialist Hospital Follow-up appointment confirmed?: Yes Date of Specialist follow-up appointment?: 01/02/25 Follow-Up Specialty Provider:: Infusion for MS  (monthly per pt) Do you need transportation to your follow-up appointment?: No Do you understand care options if your condition(s) worsen?: Yes-patient verbalized understanding  SDOH Interventions Today    Flowsheet Row Most Recent Value  SDOH Interventions   Food Insecurity Interventions Intervention Not Indicated  Housing Interventions Intervention Not Indicated  Transportation Interventions Intervention Not Indicated  Utilities Interventions Intervention Not Indicated    Goals Addressed             This Visit's Progress    VBCI Transitions of Care (TOC) Care Plan       Problems:  Recent Hospitalization for treatment of SBO (small bowel  obstruction) No Hospital Follow Up Provider appointment pt declines for RN CM to schedule post hospital follow up, states his aide will schedule  Goal:  Over the next 30 days, the patient will not experience hospital  readmission  Interventions:  Transitions of Care: Doctor Visits  - discussed the importance of doctor visits Reviewed Signs and symptoms of infection Reviewed safety precautions Evaluation of current treatment plan related to SBO, self-management and patient's adherence to plan as established by provider. Discussed plans with patient for ongoing care management follow up and provided patient with direct contact information for care management team Evaluation of current treatment plan related to SBO and patient's adherence to plan as established by provider Reviewed medications with patient and discussed importance of taking as prescribed Discussed plans with patient for ongoing care management follow up and provided patient with direct contact information for care management team Screening for signs and symptoms of depression related to chronic disease state  Assessed social determinant of health barriers Reviewed all upcoming scheduled appointments Encouraged pt to change positions q 2 hours Reviewed signs /symptoms SBO, reportable signs /symptoms, importance of notifying provider and seeking medical attention  Patient Self Care Activities:  Attend all scheduled provider appointments Call pharmacy for medication refills 3-7 days in advance of running out of medications Call provider office for new concerns or questions  Notify RN Care Manager of TOC call rescheduling needs Participate in Transition of Care Program/Attend TOC scheduled calls Take medications as prescribed   Change positions every 2 hours Keep skin clean and dry  Plan:  Telephone follow up appointment with care management team member scheduled for:  01/04/25 @ 115 pm The patient has been provided with contact information for the care management team and has been advised to call with any health related questions or concerns.          Mliss Creed [facility], BSN RN Care Manager/ Transition of Care [place]/ [facility] [phone]  "

## 2025-01-02 ENCOUNTER — Ambulatory Visit

## 2025-01-04 ENCOUNTER — Other Ambulatory Visit: Payer: Self-pay | Admitting: *Deleted

## 2025-01-04 ENCOUNTER — Ambulatory Visit (INDEPENDENT_AMBULATORY_CARE_PROVIDER_SITE_OTHER)

## 2025-01-04 ENCOUNTER — Telehealth (HOSPITAL_COMMUNITY): Payer: Self-pay | Admitting: Pharmacy Technician

## 2025-01-04 VITALS — BP 137/73 | HR 72 | Temp 98.3°F | Resp 16 | Wt 127.0 lb

## 2025-01-04 DIAGNOSIS — G35D Multiple sclerosis, unspecified: Secondary | ICD-10-CM | POA: Diagnosis not present

## 2025-01-04 MED ORDER — SODIUM CHLORIDE 0.9 % IV SOLN: INTRAVENOUS | Status: DC

## 2025-01-04 MED ORDER — LORATADINE 10 MG PO TABS
10.0000 mg | ORAL_TABLET | Freq: Once | ORAL | Status: AC
  Administered 2025-01-04: 10 mg via ORAL
  Filled 2025-01-04: qty 1

## 2025-01-04 MED ORDER — SODIUM CHLORIDE 0.9 % IV SOLN
300.0000 mg | Freq: Once | INTRAVENOUS | Status: AC
  Administered 2025-01-04: 300 mg via INTRAVENOUS
  Filled 2025-01-04: qty 15

## 2025-01-04 MED ORDER — ACETAMINOPHEN 325 MG PO TABS
650.0000 mg | ORAL_TABLET | Freq: Once | ORAL | Status: AC
  Administered 2025-01-04: 650 mg via ORAL
  Filled 2025-01-04: qty 2

## 2025-01-04 NOTE — Telephone Encounter (Addendum)
 Auth Submission: NO AUTH NEEDED Site of care: [facility] Payer: [employer] MEDICARE Medication & CPT/J Code(s) submitted: Tysabri  (Natalizumab ) G7676 Diagnosis Code: G35.D  Route of submission (phone, fax, portal): portal Phone # Fax # Auth type: Buy/Bill HB Units/visits requested: 300MG  EVERY 4 WEEKS Reference number: [id number] Approval from: 01/04/2025 to 12/27/25   [facility] is not able to administer Tysabri  (only [facility] and [facility] locations can)   Dagoberto Armour, CPhT [facility] Phone: [phone] 01/04/2025

## 2025-01-04 NOTE — Patient Instructions (Signed)
 Visit Information  Thank you for taking time to visit with me today. Please don't hesitate to contact me if I can be of assistance to you before our next scheduled telephone appointment.  Our next appointment is by telephone on 01/11/25 @ 11 am  Following is a copy of your care plan:   Goals Addressed             This Visit's Progress    VBCI Transitions of Care (TOC) Care Plan       Problems:  Recent Hospitalization for treatment of SBO (small bowel obstruction) No Hospital Follow Up Provider appointment pt declines for RN CM to schedule post hospital follow up, states his aide will schedule 01/04/25- spoke with pt and permission give to speak with caregiver/ aide Harlene, pt / aide report they are just headed home, pt received infusion today for MS management, tolerated well, no concerns reported with colostomy, no new concerns reported  Goal:  Over the next 30 days, the patient will not experience hospital readmission  Interventions:  Transitions of Care: Doctor Visits  - discussed the importance of doctor visits Reviewed Signs and symptoms of infection Reinforced safety precautions Evaluation of current treatment plan related to SBO, self-management and patient's adherence to plan as established by provider. Discussed plans with patient for ongoing care management follow up and provided patient with direct contact information for care management team Evaluation of current treatment plan related to SBO and patient's adherence to plan as established by provider Reviewed medications with patient and discussed importance of taking as prescribed Discussed plans with patient for ongoing care management follow up and provided patient with direct contact information for care management team Reviewed all upcoming scheduled appointments Encouraged pt to change positions q 2 hours Reviewed signs /symptoms SBO, reportable signs /symptoms, importance of notifying provider and seeking medical  attention  Patient Self Care Activities:  Attend all scheduled provider appointments Call pharmacy for medication refills 3-7 days in advance of running out of medications Call provider office for new concerns or questions  Notify RN Care Manager of TOC call rescheduling needs Participate in Transition of Care Program/Attend TOC scheduled calls Take medications as prescribed   Change positions every 2 hours Keep skin clean and dry Always use assistive devices and ask for help as needed  Plan:  Telephone follow up appointment with care management team member scheduled for:  01/11/25 @ 11 am with Davina Green RN The patient has been provided with contact information for the care management team and has been advised to call with any health related questions or concerns.         Care plan and visit instructions communicated with the patient verbally today. Patient agrees to receive a copy in MyChart. Active MyChart status and patient understanding of how to access instructions and care plan via MyChart confirmed with patient.     Telephone follow up appointment with care management team member scheduled for:   01/11/25 @ 11 am  Please call the care guide team at [phone] if you need to cancel or reschedule your appointment.   Please call the Suicide and Crisis Lifeline: 988 call the [place]  National Suicide Prevention Lifeline: (737)034-8224 or TTY: 412-626-5950 TTY 254-623-1356) to talk to a trained counselor call 1-800-273-TALK (toll free, 24 hour hotline) go to [facility] [address] [place] [phone]) call 911 if you are experiencing a Mental Health or Behavioral Health Crisis or need someone to talk to.  Mliss Creed [facility], BSN RN Care Manager/ Transition of Care [place]/ [facility] [phone]

## 2025-01-04 NOTE — Patient Outreach (Signed)
 " Transition of Care week 2  Visit Note  01/04/2025  Name: Nathan Marsh MRN: [id number]          DOB: 04-Mar-1976  Situation: Patient enrolled in [facility] 30-day program. Visit completed with patient by telephone.   Background:  Discharge Date and Diagnosis: 12/28/24, SBO (small bowel obstruction)   Past Medical History:  Diagnosis Date   H/O colectomy    H/O splenectomy    Multiple sclerosis    Wheelchair dependent     Assessment: Patient Reported Symptoms: Cognitive Cognitive Status: No symptoms reported, Alert and oriented to person, place, and time, Able to follow simple commands, Normal speech and language skills      Neurological Neurological Review of Symptoms: Other: (chronic left sided weakness related to MS per pt) [place] Neurological Symptoms/Conditions [RPT]: MS Neurological Management Strategies: Adequate rest, Routine screening Neurological Self-Management Outcome: 3 (uncertain) Neurological Comment: pt receives monthly infusion for management of MS  HEENT HEENT Symptoms Reported: No symptoms reported      Cardiovascular Cardiovascular Symptoms Reported: No symptoms reported Does patient have uncontrolled Hypertension?: No    Respiratory Respiratory Symptoms Reported: No symptoms reported    Endocrine Endocrine Symptoms Reported: No symptoms reported Is patient diabetic?: No    Gastrointestinal Additional Gastrointestinal Details: pt hospitalization for SBO, denies any symptoms today Gastrointestinal Management Strategies: Colostomy (Caregiver/ aide changes colostomy wafer, states stoma is WNL, no concerns reported) Gastrointestinal Self-Management Outcome: 4 (good) Gastrointestinal Comment: reinforced signs/ symptoms SBO, importance of seeking medical attention as needed    Genitourinary Genitourinary Symptoms Reported: Incontinence Additional Genitourinary Details: adult diapers Genitourinary Management Strategies: Adequate rest Genitourinary Self-Management  Outcome: 3 (uncertain) Genitourinary Comment: reinforced importance of keeping skin clean and dry  Integumentary Integumentary Symptoms Reported: No symptoms reported Additional Integumentary Details: WC bound Skin Management Strategies: Adequate rest, Routine screening Skin Self-Management Outcome: 3 (uncertain) Skin Comment: reinforced importance of changing positions q 2 hours to preserve skin integrity  Musculoskeletal Musculoskelatal Symptoms Reviewed: Difficulty walking Additional Musculoskeletal Details: unable to ambulate, WC bound Musculoskeletal Management Strategies: Adequate rest, Routine screening, Medical device Musculoskeletal Self-Management Outcome: 4 (good) Musculoskeletal Comment: reinforced safety precautions      Psychosocial Psychosocial Symptoms Reported: No symptoms reported         There were no vitals filed for this visit. Pain Scale: 0-10 Pain Score: 0-No pain  Medications Reviewed Today     Reviewed by Aura Mliss LABOR, RN (Registered Nurse) on 01/04/25 at 1339  Med List Status: <None>   Medication Order Taking? Sig Documenting Provider Last Dose Status Informant     Discontinued 01/04/25 1339 (Patient Discharge)   baclofen  (LIORESAL ) 10 MG tablet [id number]  Take 10 mg by mouth 3 (three) times daily. [provider]  Active Self           Med Note (SATTERFIELD, [name]   Sat Dec 23, 2024 10:41 PM) Patient verified he is taking this medication   oxybutynin  (DITROPAN ) 5 MG tablet [id number]  Take 1 tablet (5 mg total) by mouth 2 (two) times daily.  Patient not taking: Reported on 12/29/2024   [name], [name]  Active Self           Med Note [name], [name]   Dju Dec 23, 2024 10:41 PM) Patient verified he is taking this medication             Goals Addressed             This Visit's Progress  VBCI Transitions of Care (TOC) Care Plan       Problems:  Recent Hospitalization for treatment of SBO (small bowel obstruction) No  Hospital Follow Up Provider appointment pt declines for RN CM to schedule post hospital follow up, states his aide will schedule 01/04/25- spoke with pt and permission give to speak with caregiver/ aide Harlene, pt / aide report they are just headed home, pt received infusion today for MS management, tolerated well, no concerns reported with colostomy, no new concerns reported  Goal:  Over the next 30 days, the patient will not experience hospital readmission  Interventions:  Transitions of Care: Doctor Visits  - discussed the importance of doctor visits Reviewed Signs and symptoms of infection Reinforced safety precautions Evaluation of current treatment plan related to SBO, self-management and patient's adherence to plan as established by provider. Discussed plans with patient for ongoing care management follow up and provided patient with direct contact information for care management team Evaluation of current treatment plan related to SBO and patient's adherence to plan as established by provider Reviewed medications with patient and discussed importance of taking as prescribed Discussed plans with patient for ongoing care management follow up and provided patient with direct contact information for care management team Reviewed all upcoming scheduled appointments Encouraged pt to change positions q 2 hours Reviewed signs /symptoms SBO, reportable signs /symptoms, importance of notifying provider and seeking medical attention  Patient Self Care Activities:  Attend all scheduled provider appointments Call pharmacy for medication refills 3-7 days in advance of running out of medications Call provider office for new concerns or questions  Notify RN Care Manager of TOC call rescheduling needs Participate in Transition of Care Program/Attend TOC scheduled calls Take medications as prescribed   Change positions every 2 hours Keep skin clean and dry Always use assistive devices and ask for  help as needed  Plan:  Telephone follow up appointment with care management team member scheduled for:  01/11/25 @ 11 am with Davina Green RN The patient has been provided with contact information for the care management team and has been advised to call with any health related questions or concerns.         Recommendation:   PCP Follow-up  Follow Up Plan:   Telephone follow-up 01/11/25 @ 11 am with Davina Green RN  Mliss Creed [facility], BSN RN Care Manager/ Transition of Care [place]/ [facility] [phone]     "

## 2025-01-04 NOTE — Progress Notes (Signed)
 Diagnosis: , Multiple Sclerosis  Provider:  Lonna Coder MD  Procedure: IV Infusion  IV Type: Peripheral, IV Location: R Forearm   Tysabri  (Natalizumab ), Dose: 300 mg  Infusion Start Time: 1200  Infusion Stop Time: 1307  Post Infusion IV Care: Patient declined observation and Peripheral IV Discontinued  Discharge: Condition: Good, Destination: Home . AVS Provided  Performed by:  Donny Childes, RN

## 2025-01-05 ENCOUNTER — Other Ambulatory Visit (HOSPITAL_COMMUNITY): Payer: Self-pay | Admitting: Neurology

## 2025-01-05 ENCOUNTER — Encounter (HOSPITAL_COMMUNITY): Payer: Self-pay | Admitting: Pharmacist

## 2025-01-05 NOTE — Progress Notes (Signed)
 Site of [facility] changed to [facility] infusion as [facility] is not trained to administer Tysabri .  Next scheduled for 02/01/2025 at [facility].  I called patinet's caregiver as an update  Per [facility] portal rep, the site of care can be change in the [facility] portal. Otherwise if they change it, the patient has to be contacted.   MyChart message sent to patient  Sherry Pennant, PharmD, MPH, BCPS, CPP Clinical Pharmacist

## 2025-01-11 ENCOUNTER — Telehealth: Payer: Self-pay

## 2025-01-11 ENCOUNTER — Telehealth

## 2025-01-11 NOTE — Patient Instructions (Signed)
 Visit Information  Thank you for taking time to visit with me today. Please don't hesitate to contact me if I can be of assistance to you before our next scheduled telephone appointment.  Our next appointment is by telephone on 01/16/25 at 4:00pm  Following is a copy of your care plan:   Goals Addressed             This Visit's Progress    VBCI Transitions of Care (TOC) Care Plan       Problems:  Recent Hospitalization for treatment of SBO (small bowel obstruction) No Hospital Follow Up Provider appointment-patient is scheduled for post hospital follow up 01/15/25 01/04/25- spoke with pt and permission give to speak with caregiver/ aide Harlene, pt / aide report they are just headed home, pt received infusion today for MS management, tolerated well, no concerns reported with colostomy, no new concerns reported  Goal:  Over the next 30 days, the patient will not experience hospital readmission  Interventions:  Transitions of Care: Doctor Visits  - discussed the importance of doctor visits Verified no Signs and symptoms of infection Confirmed ongoing patency of colostomy with no reported issues or events. Reinforced safety precautions Evaluation of current treatment plan related to SBO, self-management and patient's adherence to plan as established by provider. Discussed plans with patient/aide for ongoing care management follow up  Reviewed medications with patient and discussed importance of taking as prescribed Reviewed all upcoming scheduled appointments Encouraged pt to change positions q 2 hours Reviewed signs /symptoms SBO, reportable signs /symptoms, importance of notifying provider and seeking medical attention Confirmed caregiver aware of new location for upcoming infusion therapy  Patient Self Care Activities:  Attend all scheduled provider appointments Call pharmacy for medication refills 3-7 days in advance of running out of medications Call provider office for new  concerns or questions  Notify RN Care Manager of TOC call rescheduling needs Participate in Transition of Care Program/Attend TOC scheduled calls Take medications as prescribed   Change positions every 2 hours Keep skin clean and dry Always use assistive devices and ask for help as needed  Plan:  Telephone follow up appointment with care management team member scheduled for:  01/16/25 @ 4:00 pm with Giuseppe Duchemin RN The patient has been provided with contact information for the care management team and has been advised to call with any health related questions or concerns.         Patient verbalizes understanding of instructions and care plan provided today and agrees to view in MyChart. Active MyChart status and patient understanding of how to access instructions and care plan via MyChart confirmed with patient.     The patient has been provided with contact information for the care management team and has been advised to call with any health related questions or concerns.   Please call the care guide team at [phone] if you need to cancel or reschedule your appointment.   Please call the Suicide and Crisis Lifeline: 988 call the [place]  National Suicide Prevention Lifeline: (905)199-2481 or TTY: (323) 735-3276 TTY 347 143 5128) to talk to a trained counselor call 1-800-273-TALK (toll free, 24 hour hotline) if you are experiencing a Mental Health or Behavioral Health Crisis or need someone to talk to.  [name] RN, BSN, CCM [employer], Population Health Case Manager Phone: [phone]

## 2025-01-11 NOTE — Transitions of Care (Post Inpatient/ED Visit) (Signed)
 " Transition of Care week 3  Visit Note  01/11/2025  Name: Nathan Marsh MRN: [id number]          DOB: [birth year]  Situation: Patient enrolled in [facility] 30-day program. Visit completed with patient/caregiver Young) by telephone.   Background:   Initial Transition Care Management Follow-up Telephone Call Discharge Date and Diagnosis: 12/28/24, SBO (small bowel obstruction)   Past Medical History:  Diagnosis Date   H/O colectomy    H/O splenectomy    Multiple sclerosis    Wheelchair dependent     Assessment: Patient Reported Symptoms: Cognitive Cognitive Status: No symptoms reported, Alert and oriented to person, place, and time, Normal speech and language skills, Insightful and able to interpret abstract concepts      Neurological Neurological Review of Symptoms: Weakness Oher Neurological Symptoms/Conditions [RPT]: caregiver reports ongoing left side weakness related to MS Neurological Management Strategies: Medical device, Routine screening, Coping strategies Neurological Comment: pt receives monthly infusion for management of MS  HEENT HEENT Symptoms Reported: No symptoms reported      Cardiovascular Cardiovascular Symptoms Reported: No symptoms reported    Respiratory Respiratory Symptoms Reported: No symptoms reported    Endocrine Endocrine Symptoms Reported: No symptoms reported Is patient diabetic?: No    Gastrointestinal Gastrointestinal Symptoms Reported: No symptoms reported Additional Gastrointestinal Details: Caregiver reports colostomy remains patent Gastrointestinal Management Strategies: Colostomy Gastrointestinal Comment: reinforced signs/ symptoms SBO, importance of seeking medical attention as needed    Genitourinary Genitourinary Symptoms Reported: No symptoms reported    Integumentary Integumentary Symptoms Reported: No symptoms reported Additional Integumentary Details: Caegiver verifies no skin issues or ulcers reported since last  assessment Skin Management Strategies: Routine screening Skin Comment: reinforced importance of changing positions q 2 hours to preserve skin integrity  Musculoskeletal Musculoskelatal Symptoms Reviewed: Limited mobility, Difficulty walking Additional Musculoskeletal Details: unable to ambulate, WC bound Musculoskeletal Management Strategies: Medical device, Routine screening Musculoskeletal Comment: reinforced safety precautions, Denies any fall or related events      Psychosocial Psychosocial Symptoms Reported: No symptoms reported         There were no vitals filed for this visit. Pain Scale: 0-10 Pain Score: 0-No pain  Medications Reviewed Today     Reviewed by Heber Hoog E, RN (Registered Nurse) on 01/11/25 at 1111  Med List Status: <None>   Medication Order Taking? Sig Documenting Provider Last Dose Status Informant  baclofen  (LIORESAL ) 10 MG tablet [id number] Yes Take 10 mg by mouth 3 (three) times daily. [provider]  Active Self           Med Note [name], Lucca Greggs E   Thu Jan 11, 2025 10:57 AM) Aide states patient given 1 tablet 2 x day.    oxybutynin  (DITROPAN ) 5 MG tablet [id number]  Take 1 tablet (5 mg total) by mouth 2 (two) times daily.  Patient not taking: Reported on 12/29/2024   [name], [name]  Active Self           Med Note (S[place]TER[place], [name]   Dju Dec 23, 2024 10:41 PM) Patient verified he is taking this medication             Goals Addressed             This Visit's Progress    V[facility]I Transitions of Care (TOC) Care Plan       Problems:  Recent Hospitalization for treatment of SBO (small bowel obstruction) No Hospital Follow Up Provider appointment-patient is scheduled for post hospital follow up  01/15/25 01/04/25- spoke with pt and permission give to speak with caregiver/ aide Harlene, pt / aide report they are just headed home, pt received infusion today for MS management, tolerated well, no concerns reported with colostomy, no new  concerns reported  Goal:  Over the next 30 days, the patient will not experience hospital readmission  Interventions:  Transitions of Care: Doctor Visits  - discussed the importance of doctor visits Verified no Signs and symptoms of infection Confirmed ongoing patency of colostomy with no reported issues or events. Reinforced safety precautions Evaluation of current treatment plan related to SBO, self-management and patient's adherence to plan as established by provider. Discussed plans with patient/aide for ongoing care management follow up  Reviewed medications with patient and discussed importance of taking as prescribed Reviewed all upcoming scheduled appointments Encouraged pt to change positions q 2 hours Reviewed signs /symptoms SBO, reportable signs /symptoms, importance of notifying provider and seeking medical attention Confirmed caregiver aware of new location for upcoming infusion therapy  Patient Self Care Activities:  Attend all scheduled provider appointments Call pharmacy for medication refills 3-7 days in advance of running out of medications Call provider office for new concerns or questions  Notify RN Care Manager of TOC call rescheduling needs Participate in Transition of Care Program/Attend TOC scheduled calls Take medications as prescribed   Change positions every 2 hours Keep skin clean and dry Always use assistive devices and ask for help as needed  Plan:  Telephone follow up appointment with care management team member scheduled for:  01/16/25 @ 4:00 pm with Auna Mikkelsen RN The patient has been provided with contact information for the care management team and has been advised to call with any health related questions or concerns.         Recommendation:   PCP Follow-up Continue Current Plan of Care  Follow Up Plan:   Telephone follow-up in 1 week  [name] RN, BSN, CCM [facility], Population Health Case  Manager Phone: [phone]     "

## 2025-01-15 ENCOUNTER — Emergency Department (HOSPITAL_COMMUNITY)
Admission: EM | Admit: 2025-01-15 | Discharge: 2025-01-15 | Disposition: A | Discharge status: Home Health | Source: Ambulatory Visit | Attending: Emergency Medicine | Admitting: Emergency Medicine

## 2025-01-15 ENCOUNTER — Encounter (HOSPITAL_COMMUNITY): Payer: Self-pay

## 2025-01-15 ENCOUNTER — Telehealth: Payer: Self-pay | Admitting: *Deleted

## 2025-01-15 ENCOUNTER — Emergency Department (HOSPITAL_COMMUNITY)

## 2025-01-15 ENCOUNTER — Telehealth: Payer: Self-pay | Admitting: Physician Assistant

## 2025-01-15 ENCOUNTER — Ambulatory Visit: Admitting: Physician Assistant

## 2025-01-15 ENCOUNTER — Other Ambulatory Visit: Payer: Self-pay

## 2025-01-15 VITALS — BP 118/80 | HR 76 | Temp 98.8°F

## 2025-01-15 DIAGNOSIS — R531 Weakness: Secondary | ICD-10-CM

## 2025-01-15 DIAGNOSIS — R4182 Altered mental status, unspecified: Secondary | ICD-10-CM | POA: Diagnosis present

## 2025-01-15 DIAGNOSIS — G35D Multiple sclerosis, unspecified: Secondary | ICD-10-CM | POA: Insufficient documentation

## 2025-01-15 DIAGNOSIS — D72829 Elevated white blood cell count, unspecified: Secondary | ICD-10-CM | POA: Diagnosis not present

## 2025-01-15 DIAGNOSIS — Z8719 Personal history of other diseases of the digestive system: Secondary | ICD-10-CM | POA: Diagnosis not present

## 2025-01-15 DIAGNOSIS — Z939 Artificial opening status, unspecified: Secondary | ICD-10-CM

## 2025-01-15 DIAGNOSIS — N390 Urinary tract infection, site not specified: Secondary | ICD-10-CM | POA: Diagnosis not present

## 2025-01-15 LAB — URINALYSIS, ROUTINE W REFLEX MICROSCOPIC
Bilirubin Urine: NEGATIVE
Glucose, UA: NEGATIVE mg/dL
Ketones, ur: NEGATIVE mg/dL
Nitrite: NEGATIVE
Protein, ur: 30 mg/dL — AB
Specific Gravity, Urine: 1.018 (ref 1.005–1.030)
pH: 7 (ref 5.0–8.0)

## 2025-01-15 LAB — I-STAT CG4 LACTIC ACID, ED: Lactic Acid, Venous: 1.2 mmol/L (ref 0.5–1.9)

## 2025-01-15 LAB — COMPREHENSIVE METABOLIC PANEL WITH GFR
ALT: 39 U/L (ref 0–44)
AST: 24 U/L (ref 15–41)
Albumin: 4.3 g/dL (ref 3.5–5.0)
Alkaline Phosphatase: 89 U/L (ref 38–126)
Anion gap: 9 (ref 5–15)
BUN: 10 mg/dL (ref 6–20)
CO2: 30 mmol/L (ref 22–32)
Calcium: 9.6 mg/dL (ref 8.9–10.3)
Chloride: 105 mmol/L (ref 98–111)
Creatinine, Ser: 0.94 mg/dL (ref 0.61–1.24)
GFR, Estimated: >60 mL/min
Glucose, Bld: 107 mg/dL — ABNORMAL HIGH (ref 70–99)
Potassium: 3.8 mmol/L (ref 3.5–5.1)
Sodium: 143 mmol/L (ref 135–145)
Total Bilirubin: 0.3 mg/dL (ref 0.0–1.2)
Total Protein: 7.2 g/dL (ref 6.5–8.1)

## 2025-01-15 LAB — URINE DRUG SCREEN
Amphetamines: NEGATIVE
Barbiturates: NEGATIVE
Benzodiazepines: NEGATIVE
Cocaine: NEGATIVE
Fentanyl: NEGATIVE
Methadone Scn, Ur: NEGATIVE
Opiates: NEGATIVE
Tetrahydrocannabinol: POSITIVE — AB

## 2025-01-15 LAB — CBG MONITORING, ED: Glucose-Capillary: 107 mg/dL — ABNORMAL HIGH (ref 70–99)

## 2025-01-15 LAB — AMMONIA: Ammonia: 28 umol/L (ref 9–35)

## 2025-01-15 MED ORDER — SODIUM CHLORIDE 0.9 % IV SOLN
2.0000 g | Freq: Once | INTRAVENOUS | Status: AC
  Administered 2025-01-15: 2 g via INTRAVENOUS
  Filled 2025-01-15: qty 20

## 2025-01-15 MED ORDER — CEPHALEXIN 500 MG PO CAPS: 500.0000 mg | ORAL_CAPSULE | Freq: Two times a day (BID) | ORAL | 0 refills | Status: AC

## 2025-01-15 NOTE — Discharge Instructions (Addendum)
 You are seen in the emergency department for possible confusion.  You had lab work chest x-ray and a CAT scan of your head that did not show any significant abnormalities.  You do have some signs of a urinary tract infection were given an IV dose of antibiotics.  A prescription was sent to the pharmacy to start tomorrow which will last 1 week.  Please follow-up with your primary care doctor.  Return to the emergency department if any worsening or concerning symptoms

## 2025-01-15 NOTE — ED Notes (Signed)
 PT axox4. GCS 15.  PT verbalizes understanding of discharge instructions, follow up and new Rx.  Pt escorted out via stretcher to transportation home via [facility].

## 2025-01-15 NOTE — Telephone Encounter (Signed)
 See other message

## 2025-01-15 NOTE — Telephone Encounter (Signed)
 Copied from CRM [id number]. Topic: Clinical - Medical Advice >> Jan 15, 2025  1:31 PM Alexandria E wrote: Reason for CRM: Patient's sister [name] and patient's home aide called in stating that the patient is refusing ER and the sister would really appreciate if PCP would call the police to do a welfare check. This was discussed at patient's appointment today, and now the sister and aid are wanting to follow through on what PCP advised. Home health aid, Harlene, will be with the patient until 8pm today.

## 2025-01-15 NOTE — ED Triage Notes (Signed)
 Patient BIBA coming from home, home hea;th aid called EMS for period of confusion/forgetfulness, seen by PCP today who wanted him to be sent out for testing. Patient a/o x 4 ATM, hx of MS, patient is at baseline. Denies fevers. Patient reports he smoked a lot of marijuana last night. BP 152/96, HR 70, 98% RA CBG 120.

## 2025-01-15 NOTE — Progress Notes (Signed)
 "  History of Present Illness:   Chief Complaint  Patient presents with   Hospitalization Follow-up    Pt here for f/u was admitted to the hospital on 12/27 for Small bowel obstruction. Discharged on 12/28/2024.    Discussed the use of AI scribe software for clinical note transcription with the patient, who gave verbal consent to proceed.  History of Present Illness   Nathan Marsh is a 49 year old male with multiple sclerosis who presents for hospital follow up.  He was admitted on 12/23/24 for small bowel obstruction. He had an NG tube placed and was able to tolerate advancement of diet as tolerated. He was discharged 12/28/24.  He has had increased confusion and weakness, with overnight wakefulness, slower speech, and trouble remembering recent events. He is now unable to squeeze hands or turn over per his caregiver.  He was recently hospitalized for bowel obstruction attributed to scar tissue and treated for dehydration without surgery. He was discharged with a catheter, which was removed two days later.  He has a colostomy bag, and his aide is concerned about its care. Other aides changed the bag over the weekend, and there is a glue-like residue on his skin.  His multiple sclerosis has been progressively worsening. He now needs assistance with all activities of daily living including feeding and changing. There is concern about his decision-making capacity, as he manages his own finances and wants a new roommate.  His social situation is limited. He receives 60 hours of care per week through a CAP program, with his main aide providing 40 hours. He is usually alone after 8 PM except for occasional visits from a friend. His sister visits but cannot help with physical care. His relationship with his family is strained.       Past Medical History:  Diagnosis Date   H/O colectomy    H/O splenectomy    Multiple sclerosis    Wheelchair dependent      Social History[1]  Past  Surgical History:  Procedure Laterality Date   AMPUTATION Left 04/27/2017   Procedure: AMPUTATION DIGIT REVISION LEFT MIDDLE FINGER;  Surgeon: Balinda Rogue, MD;  Location: [facility] OR;  Service: Orthopedics;  Laterality: Left;   COLOSTOMY  05/24/2024   SKIN GRAFT SPLIT THICKNESS TRUNK  05/24/2024   SPLENECTOMY  05/24/2024   SUBTOTAL COLECTOMY Left 05/24/2024    Family History  Problem Relation Age of Onset   Cancer Mother        lung    Allergies[2]  Current Medications:  Current Medications[3]   Review of Systems:   Negative unless otherwise specified per HPI.  Vitals:   Vitals:   01/15/25 1042  BP: 118/80  Pulse: 76  Temp: 98.8 F (37.1 C)  TempSrc: Temporal     There is no height or weight on file to calculate BMI.  Physical Exam:   Physical Exam Vitals and nursing note reviewed.  Constitutional:      General: He is not in acute distress.    Appearance: He is well-developed. He is not ill-appearing or toxic-appearing.  Cardiovascular:     Rate and Rhythm: Normal rate and regular rhythm.     Pulses: Normal pulses.     Heart sounds: Normal heart sounds, S1 normal and S2 normal.  Pulmonary:     Effort: Pulmonary effort is normal.     Breath sounds: Normal breath sounds.  Skin:    General: Skin is warm and dry.  Neurological:  Mental Status: He is alert.     GCS: GCS eye subscore is 4. GCS verbal subscore is 5. GCS motor subscore is 6.     Motor: Weakness present.  Psychiatric:        Speech: Speech is delayed.        Behavior: Behavior normal. Behavior is cooperative.        Cognition and Memory: Cognition is impaired.     Assessment and Plan:   Assessment and Plan    Altered mental status, unspecified altered mental status type  I have known patient for several years -- his affect and demeanor is quite abnormal for him today. I asked if there were any use of illicit substances prior to our encounter and he and his caregiver adamantly refuse this.  His thought processing is not clear today. I also reminded him that the last time he presented to the office this way, he was found to have sepsis.  He was advised to go to the ER however he declined due to having his power wheelchair with him. I asked his caregiver to call EMS when he arrived home for further evaluation. She was in agreement.  Multiple sclerosis  Progressive cognitive decline with concerns about decision-making capacity and safety. Neurologist recommended long-term care facility, but he is resistant. - Continue efforts at close follow up with neurology for ongoing management   History of small bowel obstruction  Recent hospitalization for bowel obstruction due to possible scar tissue from previous surgeries. Dehydration noted during hospitalization. - Ensure adequate hydration. - reports there are no further concerns  History of creation of ostomy [facility])  Complication with colostomy device, possibly due to improper care by multiple caregivers. Residue on skin resembling super glue, possibly from adhesive used in colostomy care. - Referred to colostomy nurse for home visit to assess and manage colostomy care. Will refer to ostomy clinic if no home health benefits for him.  I personally spent a total of 52 minutes in the care of the patient today including preparing to see the patient, getting/reviewing separately obtained history, performing a medically appropriate exam/evaluation, counseling and educating, placing orders, documenting clinical information in the EHR.   Lucie Buttner, PA-C     [1]  Social History Tobacco Use   Smoking status: Every Day    Current packs/day: 0.50    Average packs/day: 0.5 packs/day for 24.6 years (12.3 ttl pk-yrs)    Types: Cigarettes    Start date: 05/31/2000   Smokeless tobacco: Never  Vaping Use   Vaping status: Never Used  Substance Use Topics   Alcohol use: Yes    Comment: very limited   Drug use: Yes    Types: Marijuana   [2] No Known Allergies [3]  Current Outpatient Medications:    baclofen  (LIORESAL ) 10 MG tablet, Take 10 mg by mouth 3 (three) times daily., Disp: , Rfl:    diphenhydramine -acetaminophen  (TYLENOL  PM) 25-500 MG TABS tablet, Take 1 tablet by mouth at bedtime as needed., Disp: , Rfl:    [Paused] oxybutynin  (DITROPAN ) 5 MG tablet, Take 1 tablet (5 mg total) by mouth 2 (two) times daily. (Patient not taking: Reported on 01/15/2025), Disp: 60 tablet, Rfl: 5  "

## 2025-01-15 NOTE — Telephone Encounter (Signed)
 Spoke to [place] pt's caregiver asked her why she did not take pt to ED? Harlene said, pt refuses to go and she can not force him to go or argue with him. She said the provider was going to send someone. Told her yes, Lucie told me to call to see why he was not there yet and I will let her know that pt  refuses to go and then she will contact someone. Harlene verbalized understanding.

## 2025-01-15 NOTE — ED Provider Notes (Signed)
 "  [facility] Provider Note   CSN: [id number] Arrival date & time: 01/15/25  1619     Patient presents with: Altered Mental Status   Nathan Marsh is a 49 y.o. male.  He has a history of significant progressive multiple sclerosis which is left in quite impaired.  He is nonambulatory, lives at home and has caregivers for 8 hours a day.  He also has a history of neurogenic bladder, colostomy.  He was admitted last month for small bowel obstruction which improved nonoperatively.  He saw his primary care doctor today and was noted to be altered.  Patient himself says he is fine and he attributes this to smoking a lot of marijuana.  Denies any medical complaints.  Said no fevers chills chest pain shortness of breath abdominal pain vomiting diarrhea or urinary symptoms.  {Add pertinent medical, surgical, social history, OB history to YEP:67052} The history is provided by the patient.  Altered Mental Status Presenting symptoms: disorientation   Most recent episode:  Today Context: drug use   Associated symptoms: no abdominal pain, no difficulty breathing, no fever, no headaches, no nausea and no vomiting        Prior to Admission medications  Medication Sig Start Date End Date Taking? Authorizing Provider  baclofen  (LIORESAL ) 10 MG tablet Take 10 mg by mouth 3 (three) times daily. 03/07/24   [provider]  diphenhydramine -acetaminophen  (TYLENOL  PM) 25-500 MG TABS tablet Take 1 tablet by mouth at bedtime as needed.    [provider]  [Paused] oxybutynin  (DITROPAN ) 5 MG tablet Take 1 tablet (5 mg total) by mouth 2 (two) times daily. Patient not taking: Reported on 01/15/2025 Wait to take this until your doctor or other care provider tells you to start again. 06/21/23   [name], PA    Allergies: Patient has no known allergies.    Review of Systems  Constitutional:  Negative for fever.  Respiratory:  Negative for  shortness of breath.   Cardiovascular:  Negative for chest pain.  Gastrointestinal:  Negative for abdominal pain, nausea and vomiting.  Neurological:  Negative for headaches.    Updated Vital Signs BP 128/87 (BP Location: Left Arm)   Pulse 64   Temp 98.5 F (36.9 C) (Oral)   Resp 16   SpO2 100%   Physical Exam Vitals and nursing note reviewed.  Constitutional:      General: He is not in acute distress.    Appearance: Normal appearance. He is well-developed.  HENT:     Head: Normocephalic and atraumatic.  Eyes:     Conjunctiva/sclera: Conjunctivae normal.  Cardiovascular:     Rate and Rhythm: Normal rate and regular rhythm.     Heart sounds: No murmur heard. Pulmonary:     Effort: Pulmonary effort is normal. No respiratory distress.     Breath sounds: Normal breath sounds.  Abdominal:     Palpations: Abdomen is soft.     Tenderness: There is no abdominal tenderness. There is no guarding or rebound.     Comments: Ostomy in place  Musculoskeletal:        General: No deformity.     Cervical back: Neck supple.  Skin:    General: Skin is warm and dry.     Capillary Refill: Capillary refill takes less than 2 seconds.  Neurological:     Mental Status: He is alert.     Motor: Weakness present.     Comments: He is  awake and alert.  He is conversant and his thought process is intact.  He is rather slow to answer at times.  Oriented.  He has weakness of extremities 4 out of 5 arms 3 out of 5 legs.  He said this is baseline for him and he is nonambulatory.  Psychiatric:        Mood and Affect: Mood normal.     (all labs ordered are listed, but only abnormal results are displayed) Labs Reviewed  CULTURE, BLOOD (ROUTINE X 2)  CULTURE, BLOOD (ROUTINE X 2)  COMPREHENSIVE METABOLIC PANEL WITH GFR  CBC WITH DIFFERENTIAL/PLATELET  URINALYSIS, ROUTINE W REFLEX MICROSCOPIC  AMMONIA  URINE DRUG SCREEN  ETHANOL  CBG MONITORING, ED  I-STAT CG4 LACTIC ACID, ED     EKG: None  Radiology: No results found.  {Document cardiac monitor, telemetry assessment procedure when appropriate:32947} Procedures   Medications Ordered in the ED - No data to display    {Click here for ABCD2, HEART and other calculators REFRESH Note before signing:1}                              Medical Decision Making Amount and/or Complexity of Data Reviewed Labs: ordered. Radiology: ordered.   This patient complains of ***; this involves an extensive number of treatment Options and is a complaint that carries with it a high risk of complications and morbidity. The differential includes ***  I ordered, reviewed and interpreted labs, which included *** I ordered medication *** and reviewed PMP when indicated. I ordered imaging studies which included *** and I independently    visualized and interpreted imaging which showed *** Additional history obtained from *** Previous records obtained and reviewed *** I consulted *** and discussed lab and imaging findings and discussed disposition.  Cardiac monitoring reviewed, *** Social determinants considered, *** Critical Interventions: ***  After the interventions stated above, I reevaluated the patient and found *** Admission and further testing considered, ***   {Document critical care time when appropriate  Document review of labs and clinical decision tools ie CHADS2VASC2, etc  Document your independent review of radiology images and any outside records  Document your discussion with family members, caretakers and with consultants  Document social determinants of health affecting pt's care  Document your decision making why or why not admission, treatments were needed:32947:::1}   Final diagnoses:  None    ED Discharge Orders     None        "

## 2025-01-15 NOTE — ED Notes (Signed)
 First poc with patient. Pt resting in bed with no complaints.  VS obtained. PT axox4. GCS 15, delayed speech, baseline.  Denies pain.  Pt requesting crackers and peanut butter. PT on cellular device. No respiratory distress noted.

## 2025-01-15 NOTE — Telephone Encounter (Signed)
Samantha, please see message. 

## 2025-01-15 NOTE — Telephone Encounter (Signed)
 Called Harlene again asked her if pt went to ED. Harlene said, he just left via ambulance. Told her okay, good.

## 2025-01-15 NOTE — Telephone Encounter (Signed)
 Discussed pt with Lucie, she said to call Harlene the caregiver back and have her call EMS for pt.

## 2025-01-15 NOTE — Telephone Encounter (Signed)
 Spoke to [place], told her needs to call EMS now to come get pt to take to the hospital to be evaluated. Asked Harlene it pt is still confused? She said pt is sleeping at present and she has been checking on him. Told her to call EMS and tell them he is confused and was told by his provider to go to the ED to be evaluated. If they ask tell them we would of sent to ED from office, but he had his wheelchair and you had no other transportation. Jessica verbalize understanding.

## 2025-01-16 ENCOUNTER — Telehealth: Payer: Self-pay

## 2025-01-16 LAB — CBC WITH DIFFERENTIAL/PLATELET
Abs Immature Granulocytes: 0.05 K/uL (ref 0.00–0.07)
Basophils Absolute: 0.1 K/uL (ref 0.0–0.1)
Basophils Relative: 1 %
Eosinophils Absolute: 0.4 K/uL (ref 0.0–0.5)
Eosinophils Relative: 3 %
HCT: 41 % (ref 39.0–52.0)
Hemoglobin: 12.8 g/dL — ABNORMAL LOW (ref 13.0–17.0)
Immature Granulocytes: 0 %
Lymphocytes Relative: 44 %
Lymphs Abs: 5.2 K/uL — ABNORMAL HIGH (ref 0.7–4.0)
MCH: 24 pg — ABNORMAL LOW (ref 26.0–34.0)
MCHC: 31.2 g/dL (ref 30.0–36.0)
MCV: 76.8 fL — ABNORMAL LOW (ref 80.0–100.0)
Monocytes Absolute: 1.2 K/uL — ABNORMAL HIGH (ref 0.1–1.0)
Monocytes Relative: 11 %
Neutro Abs: 4.8 K/uL (ref 1.7–7.7)
Neutrophils Relative %: 41 %
Platelets: 285 K/uL (ref 150–400)
RBC: 5.34 MIL/uL (ref 4.22–5.81)
RDW: 18.6 % — ABNORMAL HIGH (ref 11.5–15.5)
Smear Review: NORMAL
WBC: 11.8 K/uL — ABNORMAL HIGH (ref 4.0–10.5)
nRBC: 9.3 % — ABNORMAL HIGH (ref 0.0–0.2)

## 2025-01-16 NOTE — Transitions of Care (Post Inpatient/ED Visit) (Unsigned)
 " Transition of Care week 4  Visit Note  01/17/2025  Name: Nathan Marsh MRN: [id number]          DOB: Apr 28, 1976  Situation: Patient enrolled in [facility] 30-day program. Visit completed with patient by telephone.   Background:   Initial Transition Care Management Follow-up Telephone Call Discharge Date and Diagnosis: 12/28/24, SBO (small bowel obstruction)   Past Medical History:  Diagnosis Date   H/O colectomy    H/O splenectomy    Multiple sclerosis    Wheelchair dependent     Assessment: Patient Reported Symptoms: Cognitive Cognitive Status: Alert and oriented to person, place, and time, Insightful and able to interpret abstract concepts, Normal speech and language skills      Neurological Neurological Review of Symptoms: Weakness Oher Neurological Symptoms/Conditions [RPT]: caregiver reports ongoing left side weakness related to MS. Denies any changes. Neurological Management Strategies: Medical device, Routine screening  HEENT HEENT Symptoms Reported: No symptoms reported      Cardiovascular Cardiovascular Symptoms Reported: No symptoms reported    Respiratory Respiratory Symptoms Reported: No symptoms reported    Endocrine Endocrine Symptoms Reported: No symptoms reported    Gastrointestinal Gastrointestinal Symptoms Reported: No symptoms reported Additional Gastrointestinal Details: patient reports colostomy remains patent and denies any issues or concerns. Gastrointestinal Management Strategies: Colostomy Gastrointestinal Comment: patient denies having any issues with his colostomy    Genitourinary Genitourinary Symptoms Reported: Incontinence Genitourinary Management Strategies: Incontinence garment/pad  Integumentary Integumentary Symptoms Reported: No symptoms reported Additional Integumentary Details: Caregiver verifies no skin issues or ulcers reported since last assessment Skin Management Strategies: Routine screening  Musculoskeletal Musculoskelatal  Symptoms Reviewed: Limited mobility, Difficulty walking Additional Musculoskeletal Details: unable to ambulate, WC bound. Referral noted in chart for home PT Musculoskeletal Management Strategies: Medical device, Routine screening      Psychosocial Psychosocial Symptoms Reported: No symptoms reported         There were no vitals filed for this visit. Pain Scale: 0-10 Pain Score: 0-No pain  Medications Reviewed Today     Reviewed by Katriel Cutsforth E, RN (Registered Nurse) on 01/16/25 at 1743  Med List Status: <None>   Medication Order Taking? Sig Documenting Provider Last Dose Status Informant  baclofen  (LIORESAL ) 10 MG tablet [id number] Yes Take 10 mg by mouth 3 (three) times daily. [provider]  Active Self           Med Note [name], Dalylah Ramey E   Thu Jan 11, 2025 10:57 AM) Aide states patient given 1 tablet 2 x day.    cephALEXin  (KEFLEX ) 500 MG capsule [id number] Yes Take 1 capsule (500 mg total) by mouth 2 (two) times daily. [name], MD  Active   diphenhydramine -acetaminophen  (TYLENOL  PM) 25-500 MG TABS tablet [id number] Yes Take 1 tablet by mouth at bedtime as needed. [provider]  Active   oxybutynin  (DITROPAN ) 5 MG tablet [id number]  Take 1 tablet (5 mg total) by mouth 2 (two) times daily.  Patient not taking: Reported on 01/15/2025   [name], PA  Active Self           Med Note (SATTERFIELD, [name]   Dju Dec 23, 2024 10:41 PM) Patient verified he is taking this medication             Goals Addressed             This Visit's Progress    VBCI Transitions of Care (TOC) Care Plan       Problems:  Recent  Hospitalization for treatment of SBO (small bowel obstruction) No Hospital Follow Up Provider appointment-patient is scheduled for post hospital follow up 01/15/25 01/04/25- spoke with pt and permission give to speak with caregiver/ aide Harlene, pt / aide report they are just headed home, pt received infusion today for MS management,  tolerated well, no concerns reported with colostomy, no new concerns reported  Goal:  Over the next 30 days, the patient will not experience hospital readmission  Interventions:  Transitions of Care: Doctor Visits  - discussed the importance of doctor visits Verified no Signs and symptoms of infection Confirmed ongoing patency of colostomy with no reported issues or events. Reinforced safety precautions Evaluation of current treatment plan related to SBO, self-management and patient's adherence to plan as established by provider. Discussed plans with patient/aide for ongoing care management follow up  Reviewed medications with patient and discussed importance of taking as prescribed Reviewed all upcoming scheduled appointments Encouraged pt to change positions q 2 hours Reviewed signs /symptoms SBO, reportable signs /symptoms, importance of notifying provider and seeking medical attention Confirmed caregiver aware of new location for upcoming infusion therapy  Patient Self Care Activities:  Attend all scheduled provider appointments Call pharmacy for medication refills 3-7 days in advance of running out of medications Call provider office for new concerns or questions  Notify RN Care Manager of TOC call rescheduling needs Participate in Transition of Care Program/Attend TOC scheduled calls Take medications as prescribed   Change positions every 2 hours Keep skin clean and dry Always use assistive devices and ask for help as needed  Plan:  Telephone follow up appointment with care management team member scheduled for:  01/24/25 @ 11 am        Recommendation:   Continue Current Plan of Care  Follow Up Plan:   Telephone follow-up in 1 week  [name] RN, BSN, CCM [place]  [facility], Population Health Case Manager Phone: [phone]     "

## 2025-01-16 NOTE — Telephone Encounter (Signed)
 Management of benefits form completed, signed, and sent to medical records for processing.

## 2025-01-17 ENCOUNTER — Telehealth: Payer: Self-pay | Admitting: *Deleted

## 2025-01-17 NOTE — Patient Instructions (Signed)
 Visit Information  Thank you for taking time to visit with me today. Please don't hesitate to contact me if I can be of assistance to you before our next scheduled telephone appointment.  Our next appointment is by telephone on 01/24/25 at 11 am  Following is a copy of your care plan:   Goals Addressed             This Visit's Progress    VBCI Transitions of Care (TOC) Care Plan       Problems:  Recent Hospitalization for treatment of SBO (small bowel obstruction) No Hospital Follow Up Provider appointment-patient is scheduled for post hospital follow up 01/15/25 01/04/25- spoke with pt and permission give to speak with caregiver/ aide Harlene, pt / aide report they are just headed home, pt received infusion today for MS management, tolerated well, no concerns reported with colostomy, no new concerns reported  Goal:  Over the next 30 days, the patient will not experience hospital readmission  Interventions:  Transitions of Care: Doctor Visits  - discussed the importance of doctor visits Verified no Signs and symptoms of infection Confirmed ongoing patency of colostomy with no reported issues or events. Reinforced safety precautions Evaluation of current treatment plan related to SBO, self-management and patient's adherence to plan as established by provider. Discussed plans with patient/aide for ongoing care management follow up  Reviewed medications with patient and discussed importance of taking as prescribed Reviewed all upcoming scheduled appointments Encouraged pt to change positions q 2 hours Reviewed signs /symptoms SBO, reportable signs /symptoms, importance of notifying provider and seeking medical attention Confirmed caregiver aware of new location for upcoming infusion therapy  Patient Self Care Activities:  Attend all scheduled provider appointments Call pharmacy for medication refills 3-7 days in advance of running out of medications Call provider office for new  concerns or questions  Notify RN Care Manager of TOC call rescheduling needs Participate in Transition of Care Program/Attend TOC scheduled calls Take medications as prescribed   Change positions every 2 hours Keep skin clean and dry Always use assistive devices and ask for help as needed  Plan:  Telephone follow up appointment with care management team member scheduled for:  01/24/25 @ 11 am        Patient verbalizes understanding of instructions and care plan provided today and agrees to view in MyChart. Active MyChart status and patient understanding of how to access instructions and care plan via MyChart confirmed with patient.     The patient has been provided with contact information for the care management team and has been advised to call with any health related questions or concerns.   Please call the care guide team at [phone] if you need to cancel or reschedule your appointment.   Please call the Suicide and Crisis Lifeline: 988 call the [place]  National Suicide Prevention Lifeline: 317-393-0140 or TTY: 316-073-6056 TTY 848-770-0019) to talk to a trained counselor call 1-800-273-TALK (toll free, 24 hour hotline) if you are experiencing a Mental Health or Behavioral Health Crisis or need someone to talk to.  [name] RN, BSN, CCM [employer], Population Health Case Manager Phone: [phone]

## 2025-01-17 NOTE — Telephone Encounter (Signed)
 Pt request form to be mail to home address.

## 2025-01-18 LAB — URINE CULTURE: Culture: >=100000 — AB

## 2025-01-19 ENCOUNTER — Telehealth (HOSPITAL_BASED_OUTPATIENT_CLINIC_OR_DEPARTMENT_OTHER): Payer: Self-pay | Admitting: *Deleted

## 2025-01-19 NOTE — Progress Notes (Signed)
 ED Antimicrobial Stewardship Positive Culture Follow Up   Nathan Marsh is an 49 y.o. male who presented to [facility] on 01/15/2025 with a chief complaint of  Chief Complaint  Patient presents with   Altered Mental Status    Recent Results (from the past 720 hours)  Resp panel by RT-PCR (RSV, Flu A&B, Covid) Anterior Nasal Swab     Status: None   Collection Time: 12/23/24  2:43 PM   Specimen: Anterior Nasal Swab  Result Value Ref Range Status   SARS Coronavirus 2 by RT PCR NEGATIVE NEGATIVE Final   Influenza A by PCR NEGATIVE NEGATIVE Final   Influenza B by PCR NEGATIVE NEGATIVE Final    Comment: (NOTE) The Xpert Xpress SARS-CoV-2/FLU/RSV plus assay is intended as an aid in the diagnosis of influenza from Nasopharyngeal swab specimens and should not be used as a sole basis for treatment. Nasal washings and aspirates are unacceptable for Xpert Xpress SARS-CoV-2/FLU/RSV testing.  Fact Sheet for Patients: bloggercourse.com  Fact Sheet for Healthcare Providers: seriousbroker.it  This test is not yet approved or cleared by the [place]  FDA and has been authorized for detection and/or diagnosis of SARS-CoV-2 by FDA under an Emergency Use Authorization (EUA). This EUA will remain in effect (meaning this test can be used) for the duration of the COVID-19 declaration under Section 564(b)(1) of the Act, 21 U.S.C. section 360bbb-3(b)(1), unless the authorization is terminated or revoked.     Resp Syncytial Virus by PCR NEGATIVE NEGATIVE Final    Comment: (NOTE) Fact Sheet for Patients: bloggercourse.com  Fact Sheet for Healthcare Providers: seriousbroker.it  This test is not yet approved or cleared by the [place]  FDA and has been authorized for detection and/or diagnosis of SARS-CoV-2 by FDA under an Emergency Use Authorization (EUA). This EUA will remain in effect  (meaning this test can be used) for the duration of the COVID-19 declaration under Section 564(b)(1) of the Act, 21 U.S.C. section 360bbb-3(b)(1), unless the authorization is terminated or revoked.  Performed at [facility], [address], [place], [place] [address]   Culture, blood (routine x 2)     Status: None (Preliminary result)   Collection Time: 01/15/25  5:56 PM   Specimen: BLOOD  Result Value Ref Range Status   Specimen Description   Final    BLOOD BLOOD RIGHT ARM Performed at [facility], [address], [place], [place] [address]    Special Requests   Final    BOTTLES DRAWN AEROBIC AND ANAEROBIC Blood Culture adequate volume Performed at [facility], [address] [place], [place] [address]    Culture   Final    NO GROWTH 3 DAYS Performed at [facility], [address] [place], [place] [address]    Report Status PENDING  Incomplete  Urine Culture     Status: Abnormal   Collection Time: 01/15/25  6:27 PM   Specimen: Urine, Catheterized  Result Value Ref Range Status   Specimen Description   Final    URINE, CATHETERIZED Performed at [facility], [address] [place], [place] [address]    Special Requests   Final    NONE Performed at [facility], [address] [place], [place] [address]    Culture (A)  Final    >=100,000 COLONIES/mL AEROCOCCUS URINAE Standardized susceptibility testing for this organism is not available. Performed at [facility], [address], [place], [place] [address]  Report Status 01/18/2025 FINAL  Final  Culture, blood (routine x 2)     Status: None (Preliminary result)   Collection Time: 01/15/25  7:10 PM   Specimen: BLOOD LEFT ARM  Result Value Ref Range Status   Specimen Description   Final    BLOOD LEFT ARM Performed at [facility], [address], [place], [place] [address]    Special Requests   Final    BOTTLES  DRAWN AEROBIC AND ANAEROBIC Blood Culture adequate volume Performed at [facility], [address] [place], [place] [address]    Culture   Final    NO GROWTH 3 DAYS Performed at [facility], [address], [place], [place] [address]    Report Status PENDING  Incomplete    [x]  Treated with cephalexin , organism resistant to prescribed antimicrobial  48 YOM sent in for evaluation of AMS by his PCP. Upon arrival to the ED the patient admitted to smoking marijuana - which likely contributed to his mentation issues, UDS also + for THC. Denied urinary symptoms, UCx grew aerococcus which can be a normal colonizer and not covered by the prescribed antimicrobial.   Contact the patient to STOP cephalexin , no additional antibiotics are needed at this time  ED Provider: Bobbye Greener, PA  Thank you for allowing pharmacy to be a part of this patients care.  Almarie Lunger, PharmD, BCPS, BCIDP Infectious Diseases Clinical Pharmacist 01/19/2025 8:14 AM   **Pharmacist phone directory can now be found on [email or web address] ([email or web address]).  Listed under [facility].

## 2025-01-19 NOTE — Telephone Encounter (Signed)
 Post ED Visit - Positive Culture Follow-up: Successful Patient Follow-Up  Culture assessed and recommendations reviewed by:  []  Rankin Dee, Pharm.D. []  Venetia Gully, Pharm.D., BCPS AQ-ID []  Garrel Crews, Pharm.D., BCPS [x]  Almarie Lunger, [address].D., BCPS []  [place], [address].D., BCPS, AAHIVP []  Rosaline Bihari, Pharm.D., BCPS, AAHIVP []  Vernell Meier, PharmD, BCPS []  Latanya Hint, PharmD, BCPS []  Donald Medley, PharmD, BCPS []  Rocky Bold, PharmD  Positive urine culture  [x]  Patient discharged with antimicrobial prescription and treatment is now NOT indicated []  Organism is resistant to prescribed ED discharge antimicrobial []  Patient with positive blood cultures  Changes discussed with ED provider: Bobbye Greener, PA-C New antibiotic prescription: N/A; STOP cephalexin  Called to: N/A  Contacted patient, date 01/19/25, time 10:40   Lorita Barnie Pereyra 01/19/2025, 10:42 AM

## 2025-01-20 LAB — CULTURE, BLOOD (ROUTINE X 2)
Culture: NO GROWTH
Culture: NO GROWTH
Special Requests: ADEQUATE
Special Requests: ADEQUATE

## 2025-01-25 ENCOUNTER — Telehealth: Payer: Self-pay

## 2025-01-25 NOTE — Patient Instructions (Signed)
 Visit Information  Thank you for taking time to visit with me today. Please don't hesitate to contact me if I can be of assistance to you before our next scheduled telephone appointment.  Our next appointment is by telephone on 02/01/25 at 10 am  Following is a copy of your care plan:   Goals Addressed             This Visit's Progress    VBCI Transitions of Care (TOC) Care Plan       Problems:  Recent Hospitalization for treatment of SBO (small bowel obstruction) Patient gives verbal permission to speak with caregiver/ aide [name] regarding his personal health information ED visit on 01/15/25 for altered mental status. Discharged home with antibiotic due to possible UTI per chart review.   Goal:  Over the next 30 days, the patient will not experience hospital readmission  Interventions:  Transitions of Care: Doctor Visits  - discussed the importance of doctor visits Verified no Signs and symptoms of infection Confirmed ongoing patency of colostomy with no reported issues or events. Evaluation of current treatment plan related to SBO, self-management and patient's adherence to plan as established by provider. Discussed plans with patient/aide for ongoing care management follow up  Reviewed medications with patient/ caregiver and discussed compliance with medication regimen Reviewed all upcoming scheduled appointments Encouraged pt to change positions q 2 hours Reviewed signs /symptoms SBO, reportable signs /symptoms, importance of notifying provider and seeking medical attention Assessed for any new/ ongoing symptoms Advised to call neurology office and ask to be put on waiting list for an earlier appointment Advised to call primary care provider for any new/ ongoing symptoms  Patient Self Care Activities:  Attend all scheduled provider appointments Call pharmacy for medication refills 3-7 days in advance of running out of medications Call provider office for new  concerns or questions  Notify RN Care Manager of TOC call rescheduling needs Participate in Transition of Care Program/Attend TOC scheduled calls Take medications as prescribed   Change positions every 2 hours Keep skin clean and dry Always use assistive devices and ask for help as needed Call neurology office and ask to be put waiting list for an earlier appointment.   Plan:  Telephone follow up appointment with care management team member scheduled for:  02/01/25 at 10 am        Patient verbalizes understanding of instructions and care plan provided today and agrees to view in MyChart. Active MyChart status and patient understanding of how to access instructions and care plan via MyChart confirmed with patient.     The patient has been provided with contact information for the care management team and has been advised to call with any health related questions or concerns.   Please call the care guide team at [phone] if you need to cancel or reschedule your appointment.   Please call the Suicide and Crisis Lifeline: 988 call the [place]  National Suicide Prevention Lifeline: (719)331-7179 or TTY: 442 681 0628 TTY 902-009-1117) to talk to a trained counselor call 1-800-273-TALK (toll free, 24 hour hotline) if you are experiencing a Mental Health or Behavioral Health Crisis or need someone to talk to.  [name] RN, BSN, CCM [employer], Population Health Case Manager Phone: [phone]

## 2025-01-25 NOTE — Transitions of Care (Post Inpatient/ED Visit) (Signed)
 " Transition of Care week 4  Visit Note  01/25/2025  Name: Nathan Marsh MRN: [id number]          DOB: 1976/11/22  Situation: Patient enrolled in [facility] 30-day program. Visit completed with patient and caregiver [name] by telephone.   Background:   Initial Transition Care Management Follow-up Telephone Call Discharge Date and Diagnosis: 12/28/24, SBO (small bowel obstruction)   Past Medical History:  Diagnosis Date   H/O colectomy    H/O splenectomy    Multiple sclerosis    Wheelchair dependent     Assessment: Patient Reported Symptoms: Cognitive Cognitive Status: No symptoms reported, Alert and oriented to person, place, and time, Normal speech and language skills, Insightful and Nathan to interpret abstract concepts      Neurological Neurological Review of Symptoms: Weakness Oher Neurological Symptoms/Conditions [RPT]: caregiver reports ongoing left side weakness related to MS Neurological Management Strategies: Medical device, Routine screening  HEENT HEENT Symptoms Reported: No symptoms reported      Cardiovascular Cardiovascular Symptoms Reported: No symptoms reported    Respiratory Respiratory Symptoms Reported: No symptoms reported    Endocrine Endocrine Symptoms Reported: No symptoms reported    Gastrointestinal Gastrointestinal Symptoms Reported: No symptoms reported      Genitourinary Genitourinary Symptoms Reported: Incontinence Genitourinary Management Strategies: Incontinence garment/pad  Integumentary Integumentary Symptoms Reported: Other Other Integumentary Symptoms: caregiver states patient has mild swelling in right leg. She states this is normal for him. Skin Management Strategies: Routine screening  Musculoskeletal Musculoskelatal Symptoms Reviewed: Limited mobility, Difficulty walking Additional Musculoskeletal Details: unable to ambulate, WC bound        Psychosocial Psychosocial Symptoms Reported: No symptoms reported          There were no vitals filed for this visit. Pain Scale: 0-10 Pain Score: 0-No pain  Medications Reviewed Today     Reviewed by Kelise Kuch E, RN (Registered Nurse) on 01/25/25 at 1012  Med List Status: <None>   Medication Order Taking? Sig Documenting Provider Last Dose Status Informant  baclofen  (LIORESAL ) 10 MG tablet [id number] Yes Take 10 mg by mouth 3 (three) times daily. [provider]  Active Self           Med Note [name], Eustacia Urbanek E   Thu Jan 11, 2025 10:57 AM) Aide states patient given 1 tablet 2 x day.    cephALEXin  (KEFLEX ) 500 MG capsule [id number]  Take 1 capsule (500 mg total) by mouth 2 (two) times daily.  Patient not taking: Reported on 01/25/2025   [name], MD  Active   diphenhydramine -acetaminophen  (TYLENOL  PM) 25-500 MG TABS tablet [id number] Yes Take 1 tablet by mouth at bedtime as needed. [provider]  Active   oxybutynin  (DITROPAN ) 5 MG tablet [id number]  Take 1 tablet (5 mg total) by mouth 2 (two) times daily.  Patient not taking: Reported on 01/15/2025   [name], PA  Active Self           Med Note (SATTER[place], [name]   Dju Dec 23, 2024 10:41 PM) Patient verified he is taking this medication             Goals Addressed             This Visit's Progress    [facility]I Transitions of Care (TOC) Care Plan       Problems:  Recent Hospitalization for treatment of SBO (small bowel obstruction) Patient gives verbal permission to speak with caregiver/ aide [name] regarding his personal  health information ED visit on 01/15/25 for altered mental status. Discharged home with antibiotic due to possible UTI per chart review.   Goal:  Over the next 30 days, the patient will not experience hospital readmission  Interventions:  Transitions of Care: Doctor Visits  - discussed the importance of doctor visits Verified no Signs and symptoms of infection Confirmed ongoing patency of colostomy with no reported issues or  events. Evaluation of current treatment plan related to SBO, self-management and patient's adherence to plan as established by provider. Discussed plans with patient/aide for ongoing care management follow up  Reviewed medications with patient/ caregiver and discussed compliance with medication regimen Reviewed all upcoming scheduled appointments Encouraged pt to change positions q 2 hours Reviewed signs /symptoms SBO, reportable signs /symptoms, importance of notifying provider and seeking medical attention Assessed for any new/ ongoing symptoms Advised to call neurology office and ask to be put on waiting list for an earlier appointment Advised to call primary care provider for any new/ ongoing symptoms  Patient Self Care Activities:  Attend all scheduled provider appointments Call pharmacy for medication refills 3-7 days in advance of running out of medications Call provider office for new concerns or questions  Notify RN Care Manager of TOC call rescheduling needs Participate in Transition of Care Program/Attend TOC scheduled calls Take medications as prescribed   Change positions every 2 hours Keep skin clean and dry Always use assistive devices and ask for help as needed Call neurology office and ask to be put waiting list for an earlier appointment.   Plan:  Telephone follow up appointment with care management team member scheduled for:  02/01/25 at 10 am        Recommendation:   Continue Current Plan of Care  Follow Up Plan:   Telephone follow-up in 1 week  [name] RN, BSN, CCM [facility], Population Health Case Manager Phone: [phone]     "

## 2025-01-26 ENCOUNTER — Telehealth (HOSPITAL_COMMUNITY): Payer: Self-pay

## 2025-01-26 ENCOUNTER — Encounter: Payer: Self-pay | Admitting: Acute Care

## 2025-01-30 ENCOUNTER — Ambulatory Visit: Admitting: Podiatry

## 2025-02-01 ENCOUNTER — Telehealth: Payer: Self-pay

## 2025-02-01 ENCOUNTER — Ambulatory Visit

## 2025-02-01 ENCOUNTER — Telehealth: Payer: Self-pay | Admitting: *Deleted

## 2025-02-01 ENCOUNTER — Inpatient Hospital Stay (HOSPITAL_COMMUNITY): Admission: RE | Admit: 2025-02-01

## 2025-02-01 NOTE — Transitions of Care (Post Inpatient/ED Visit) (Signed)
 " Transition of Care week#5  Visit Note  02/01/2025  Name: Nathan Marsh MRN: [id number]          DOB: Feb 04, 1976  Situation: Patient enrolled in [facility] 30-day program. Visit completed with patient and caregiver, Harlene by telephone.   Background:   Initial Transition Care Management Follow-up Telephone Call Discharge Date and Diagnosis: No data recorded   Past Medical History:  Diagnosis Date   H/O colectomy    H/O splenectomy    Multiple sclerosis    Wheelchair dependent     Assessment: Patient Reported Symptoms: Cognitive Cognitive Status: Alert and oriented to person, place, and time, Insightful and able to interpret abstract concepts, Normal speech and language skills      Neurological Neurological Review of Symptoms: Weakness Oher Neurological Symptoms/Conditions [RPT]: caregiver reports ongoing left sided weakness due to MS. Neurological Management Strategies: Routine screening, Medical device  HEENT HEENT Symptoms Reported: No symptoms reported      Cardiovascular Cardiovascular Symptoms Reported: No symptoms reported    Respiratory Respiratory Symptoms Reported: No symptoms reported    Endocrine Endocrine Symptoms Reported: No symptoms reported    Gastrointestinal Gastrointestinal Symptoms Reported: No symptoms reported Additional Gastrointestinal Details: caregiver/ patient report colostomy remains patent and free of obstruction.      Genitourinary Genitourinary Symptoms Reported: Incontinence Genitourinary Management Strategies: Incontinence garment/pad  Integumentary Integumentary Symptoms Reported: Other Additional Integumentary Details: caregiver states patient complains of occasional soreness to buttock area. She denies patient having any skin breakdown/sores.  She states she continues to assist patient with turning every 2 hours and plans to purchase a donut pillow to assist patient with staying off of buttock area as much as possible. Skin Management  Strategies: Routine screening, Medical device  Musculoskeletal Musculoskelatal Symptoms Reviewed: Other Other Musculoskeletal Symptoms: unable to ambulate/wheelchair bound        Psychosocial Psychosocial Symptoms Reported: No symptoms reported         There were no vitals filed for this visit. Pain Scale: 0-10 Pain Score: 0-No pain  Medications Reviewed Today     Reviewed by Trai Ells E, RN (Registered Nurse) on 02/01/25 at 1001  Med List Status: <None>   Medication Order Taking? Sig Documenting Provider Last Dose Status Informant  baclofen  (LIORESAL ) 10 MG tablet [id number] Yes Take 10 mg by mouth 3 (three) times daily. Caregiver states patient takes 1 tablet 2 x per day [provider]  Active Self           Med Note [name], [name]   Thu Jan 11, 2025 10:57 AM) Aide states patient given 1 tablet 2 x day.    cephALEXin  (KEFLEX ) 500 MG capsule [id number]  Take 1 capsule (500 mg total) by mouth 2 (two) times daily.  Patient not taking: Reported on 01/25/2025   [name], MD  Active   diphenhydramine -acetaminophen  (TYLENOL  PM) 25-500 MG TABS tablet [id number]  Take 1 tablet by mouth at bedtime as needed. [provider]  Active   oxybutynin  (DITROPAN ) 5 MG tablet [id number]  Take 1 tablet (5 mg total) by mouth 2 (two) times daily.  Patient not taking: Reported on 01/15/2025   [name], PA  Active Self           Med Note [name], [name]   Dju Dec 23, 2024 10:41 PM) Patient verified he is taking this medication             Goals Addressed  This Visit's Progress    COMPLETED: VBCI Transitions of Care (TOC) Care Plan       Problems:  Recent Hospitalization for treatment of SBO (small bowel obstruction) Patient gives verbal permission to speak with caregiver/ aide [name] regarding his personal health information  Goal:  Over the next 30 days, the patient will not experience hospital  readmission  Interventions:  Transitions of Care: Doctor Visits  - discussed the importance of doctor visits Confirmed ongoing patency of colostomy with no reported issues or events. Evaluation of current treatment plan related to SBO, self-management and patient's adherence to plan as established by provider. Reviewed medications with patient/ caregiver and discussed compliance with medication regimen Reviewed  upcoming scheduled appointments Discussed and offered ongoing follow up with longitudinal RN case manager- patient declined.  Encouraged pt to change positions q 2 hours Answered question from caregiver regarding where to purchase donut pillow to help avoid bed sores.  Advised to check with [facility], drug stores or order from [employer] Reviewed signs /symptoms SBO, reportable signs /symptoms, importance of notifying provider and seeking medical attention Assessed for any new/ ongoing symptoms Advised to call primary care provider for any new/ ongoing symptoms  Patient Self Care Activities:  Attend all scheduled provider appointments Call pharmacy for medication refills 3-7 days in advance of running out of medications Call provider office for new concerns or questions  Notify RN Care Manager of TOC call rescheduling needs Participate in Transition of Care Program/Attend TOC scheduled calls Take medications as prescribed   Change positions every 2 hours Keep skin clean and dry Always use assistive devices and ask for help as needed Notify provider for any signs of bowel obstruction  Plan:  No further follow up required: patient has completed the 30 day TOC program and met program goals.          Recommendation:   Continue Current Plan of Care  Follow Up Plan:   Closing From:  Transitions of Care Program  Patient has completed the 30 day TOC program and met his program goals.   [name] RN, BSN, CCM [employer], Population Health Case  Manager Phone: [phone]     "

## 2025-02-01 NOTE — Patient Instructions (Signed)
 Visit Information  Thank you for taking time to visit with me today. You have completed the 30 day TOC program and met your program goals.  Please contact your primary care provider if you have any additional needs or concerns.   Following is a copy of your care plan:   Goals Addressed             This Visit's Progress    COMPLETED: VBCI Transitions of Care (TOC) Care Plan       Problems:  Recent Hospitalization for treatment of SBO (small bowel obstruction) Patient gives verbal permission to speak with caregiver/ aide [name] regarding his personal health information  Goal:  Over the next 30 days, the patient will not experience hospital readmission  Interventions:  Transitions of Care: Doctor Visits  - discussed the importance of doctor visits Confirmed ongoing patency of colostomy with no reported issues or events. Evaluation of current treatment plan related to SBO, self-management and patient's adherence to plan as established by provider. Reviewed medications with patient/ caregiver and discussed compliance with medication regimen Reviewed  upcoming scheduled appointments Discussed and offered ongoing follow up with longitudinal RN case manager- patient declined.  Encouraged pt to change positions q 2 hours Answered question from caregiver regarding where to purchase donut pillow to help avoid bed sores.  Advised to check with [facility], drug stores or order from [employer] Reviewed signs /symptoms SBO, reportable signs /symptoms, importance of notifying provider and seeking medical attention Assessed for any new/ ongoing symptoms Advised to call primary care provider for any new/ ongoing symptoms  Patient Self Care Activities:  Attend all scheduled provider appointments Call pharmacy for medication refills 3-7 days in advance of running out of medications Call provider office for new concerns or questions  Notify RN Care Manager of TOC call rescheduling  needs Participate in Transition of Care Program/Attend TOC scheduled calls Take medications as prescribed   Change positions every 2 hours Keep skin clean and dry Always use assistive devices and ask for help as needed Notify provider for any signs of bowel obstruction  Plan:  No further follow up required: patient has completed the 30 day TOC program and met program goals.         Patient verbalizes understanding of instructions and care plan provided today and agrees to view in MyChart. Active MyChart status and patient understanding of how to access instructions and care plan via MyChart confirmed with patient.     No further follow up required: patient has completed the 30 day TOC program.   Please call the care guide team at [phone] if you need to cancel or reschedule your appointment.   Please call the Suicide and Crisis Lifeline: 988 call the [place]  National Suicide Prevention Lifeline: 817-208-9876 or TTY: 843 526 6159 TTY (205)087-8121) to talk to a trained counselor call 1-800-273-TALK (toll free, 24 hour hotline) if you are experiencing a Mental Health or Behavioral Health Crisis or need someone to talk to.  [name] RN, BSN, CCM [employer], Population Health Case Manager Phone: [phone]

## 2025-02-01 NOTE — Telephone Encounter (Signed)
 Spoke to [place] pt's caregiver, told her I do not know how to order bed pillows I just would use regular pillows and put behind his back. Harlene said she is already doing that, but thought there was something like a wedge to use. Asked her if Home Health has been out to see pt. Harlene said no, no one has been here or in contact. I checked on referral to Home Health, told her our referral coordinator is working on it, looks like she spoke to someone on 2/4 and waiting to hear back them. Harlene verbalized understanding. Also in regards to gloves you will need to contact [facility] and they will send us  an order to sign, number for [employer] given. Harlene verbalized understanding.

## 2025-02-01 NOTE — Telephone Encounter (Signed)
 Copied from CRM [id number]. Topic: Clinical - Order For Equipment >> Feb 01, 2025 12:06 PM Kevelyn M wrote: Reason for CRM: Patient's Caregiver calling in to order pillows to prevent  bed sores. Patient is sensitive to the touch in certain areas right now.  Also can the order for the gloves go up a size to a size Large.  Call back # [phone]

## 2025-02-07 ENCOUNTER — Encounter (HOSPITAL_COMMUNITY)

## 2025-03-01 ENCOUNTER — Encounter (HOSPITAL_COMMUNITY)

## 2025-03-05 ENCOUNTER — Ambulatory Visit: Admitting: Physician Assistant

## 2025-03-07 ENCOUNTER — Encounter (HOSPITAL_COMMUNITY)

## 2025-07-16 ENCOUNTER — Ambulatory Visit: Admitting: Adult Health
